# Patient Record
Sex: Female | Born: 1960
Health system: Southern US, Community
[De-identification: ages and names within clinical notes are randomized; demographics above are authoritative.]

## PROBLEM LIST (undated history)

## (undated) DIAGNOSIS — J449 Chronic obstructive pulmonary disease, unspecified: Secondary | ICD-10-CM

## (undated) DIAGNOSIS — I480 Paroxysmal atrial fibrillation: Secondary | ICD-10-CM

## (undated) DIAGNOSIS — G4733 Obstructive sleep apnea (adult) (pediatric): Secondary | ICD-10-CM

## (undated) DIAGNOSIS — E0590 Thyrotoxicosis, unspecified without thyrotoxic crisis or storm: Secondary | ICD-10-CM

## (undated) DIAGNOSIS — J45909 Unspecified asthma, uncomplicated: Secondary | ICD-10-CM

## (undated) DIAGNOSIS — C50919 Malignant neoplasm of unspecified site of unspecified female breast: Secondary | ICD-10-CM

## (undated) DIAGNOSIS — I6990 Unspecified sequelae of unspecified cerebrovascular disease: Secondary | ICD-10-CM

## (undated) HISTORY — PX: BREAST BIOPSY: SHX20

## (undated) HISTORY — DX: Paroxysmal atrial fibrillation: I48.0

## (undated) HISTORY — PX: ABDOMINAL HYSTERECTOMY: SHX81

## (undated) HISTORY — DX: Malignant neoplasm of unspecified site of unspecified female breast: C50.919

## (undated) HISTORY — DX: Unspecified sequelae of unspecified cerebrovascular disease: I69.90

## (undated) HISTORY — PX: TONSILLECTOMY: SUR1361

## (undated) HISTORY — PX: PORTACATH PLACEMENT: SHX2246

## (undated) HISTORY — PX: OTHER SURGICAL HISTORY: SHX169

## (undated) HISTORY — PX: APPENDECTOMY: SHX54

## (undated) HISTORY — DX: Obstructive sleep apnea (adult) (pediatric): G47.33

## (undated) HISTORY — DX: Unspecified asthma, uncomplicated: J45.909

## (undated) HISTORY — DX: Thyrotoxicosis, unspecified without thyrotoxic crisis or storm: E05.90

## (undated) HISTORY — DX: Chronic obstructive pulmonary disease, unspecified: J44.9

---

## 2005-01-23 ENCOUNTER — Ambulatory Visit: Payer: Self-pay | Admitting: Oncology

## 2006-01-16 ENCOUNTER — Ambulatory Visit: Payer: Self-pay | Admitting: Oncology

## 2007-01-16 ENCOUNTER — Ambulatory Visit: Payer: Self-pay | Admitting: Oncology

## 2009-08-09 DIAGNOSIS — Z860101 Personal history of adenomatous and serrated colon polyps: Secondary | ICD-10-CM | POA: Insufficient documentation

## 2009-08-09 DIAGNOSIS — Z86010 Personal history of colonic polyps: Secondary | ICD-10-CM | POA: Insufficient documentation

## 2009-08-09 HISTORY — DX: Personal history of colonic polyps: Z86.010

## 2009-08-09 HISTORY — DX: Personal history of adenomatous and serrated colon polyps: Z86.0101

## 2016-01-04 DIAGNOSIS — E0590 Thyrotoxicosis, unspecified without thyrotoxic crisis or storm: Secondary | ICD-10-CM | POA: Insufficient documentation

## 2016-01-04 HISTORY — DX: Thyrotoxicosis, unspecified without thyrotoxic crisis or storm: E05.90

## 2016-03-22 DIAGNOSIS — Z853 Personal history of malignant neoplasm of breast: Secondary | ICD-10-CM

## 2016-03-25 DIAGNOSIS — C50119 Malignant neoplasm of central portion of unspecified female breast: Secondary | ICD-10-CM | POA: Insufficient documentation

## 2016-03-25 DIAGNOSIS — R0789 Other chest pain: Secondary | ICD-10-CM

## 2016-03-25 DIAGNOSIS — I6990 Unspecified sequelae of unspecified cerebrovascular disease: Secondary | ICD-10-CM

## 2016-03-25 DIAGNOSIS — G4733 Obstructive sleep apnea (adult) (pediatric): Secondary | ICD-10-CM

## 2016-03-25 DIAGNOSIS — Z171 Estrogen receptor negative status [ER-]: Secondary | ICD-10-CM

## 2016-03-25 DIAGNOSIS — I480 Paroxysmal atrial fibrillation: Secondary | ICD-10-CM | POA: Insufficient documentation

## 2016-03-25 HISTORY — DX: Unspecified sequelae of unspecified cerebrovascular disease: I69.90

## 2016-03-25 HISTORY — DX: Paroxysmal atrial fibrillation: I48.0

## 2016-03-25 HISTORY — DX: Estrogen receptor negative status (ER-): Z17.1

## 2016-03-25 HISTORY — DX: Obstructive sleep apnea (adult) (pediatric): G47.33

## 2016-03-25 HISTORY — DX: Malignant neoplasm of central portion of unspecified female breast: C50.119

## 2016-03-25 HISTORY — DX: Other chest pain: R07.89

## 2016-03-26 ENCOUNTER — Encounter: Payer: Self-pay | Admitting: Cardiology

## 2016-03-28 ENCOUNTER — Encounter: Payer: Self-pay | Admitting: Cardiology

## 2017-06-04 DIAGNOSIS — Z853 Personal history of malignant neoplasm of breast: Secondary | ICD-10-CM

## 2017-06-04 DIAGNOSIS — L989 Disorder of the skin and subcutaneous tissue, unspecified: Secondary | ICD-10-CM

## 2017-08-18 ENCOUNTER — Ambulatory Visit: Payer: Self-pay | Admitting: Cardiology

## 2017-08-18 ENCOUNTER — Encounter: Payer: Self-pay | Admitting: Cardiology

## 2017-08-18 VITALS — BP 110/62 | HR 58 | Ht 67.5 in | Wt 178.9 lb

## 2017-08-18 DIAGNOSIS — R42 Dizziness and giddiness: Secondary | ICD-10-CM | POA: Insufficient documentation

## 2017-08-18 DIAGNOSIS — R0789 Other chest pain: Secondary | ICD-10-CM

## 2017-08-18 DIAGNOSIS — I480 Paroxysmal atrial fibrillation: Secondary | ICD-10-CM

## 2017-08-18 DIAGNOSIS — I6990 Unspecified sequelae of unspecified cerebrovascular disease: Secondary | ICD-10-CM

## 2017-08-18 DIAGNOSIS — G4733 Obstructive sleep apnea (adult) (pediatric): Secondary | ICD-10-CM

## 2017-08-18 HISTORY — DX: Dizziness and giddiness: R42

## 2017-08-18 NOTE — Progress Notes (Signed)
Cardiology Office Note:    Date:  08/18/2017   ID:  Dana [name], DOB [birth year], MRN [id number]  PCP:  Jeanie Sewer, NP  Cardiologist:  Jenne Campus, MD    Referring MD: Jeanie Sewer, NP   Chief Complaint  Patient presents with  . Chest Pain  . Dizziness  . Atrial Fibrillation  I feel weak and tired  History of Present Illness:    Dana [name] is a 57 y.o. female with history of CVA.  I saw her in 2017 at the end of that here reason for that was the fact that she got paroxysmal atrial fibrillation we talked about anticoagulation also in 2014 she had CVA.  We initiated workup to put her on anticoagulation and then she disappeared from follow-up and I have not seen her until today.  Chief complaint this time is weak tired and exhausted she says she wakes up in the morning she does not feel like getting up and doing anything.  Described post to have palpitations which means by that her heart can speed up and go fast for a few seconds.  It happens maybe once or maybe twice a week.  She does not have any dizziness while she got this sensation no shortness of breath no chest pain.  Described to have also some lightheadedness she said she could feel all day kind of unsteady and lightheaded.  No syncope no passing out no nightmares. Apparently recently she was find to be mildly anemic and she is scheduled to have colonoscopy next week by Dr. Melina Copa.  Past Medical History:  Diagnosis Date  . Hyperthyroidism 01/04/2016  . Late effects of CVA (cerebrovascular accident) 03/25/2016  . Obstructive sleep apnea syndrome 03/25/2016  . Paroxysmal atrial fibrillation (HCC) 03/25/2016      Current Medications: Current Meds  Medication Sig  . aspirin EC 81 MG tablet Take 1 tablet by mouth daily.  Marland Kitchen atenolol-chlorthalidone (TENORETIC) 100-25 MG tablet Take 0.5 tablets by mouth daily.  Marland Kitchen atorvastatin (LIPITOR) 20 MG tablet Take 1 tablet by mouth daily.  . fluticasone  (FLONASE) 50 MCG/ACT nasal spray Place 2 sprays into the nose daily.  Marland Kitchen loratadine (CLARITIN) 10 MG tablet Take 1 tablet by mouth daily.  . meclizine (ANTIVERT) 25 MG tablet Take 25 mg by mouth 3 (three) times daily as needed for dizziness.  . mometasone-formoterol (DULERA) 200-5 MCG/ACT AERO Inhale 2 puffs into the lungs 2 (two) times daily.  . montelukast (SINGULAIR) 10 MG tablet Take 10 mg by mouth daily.  . pantoprazole (PROTONIX) 40 MG tablet Take 40 mg by mouth daily.  . sertraline (ZOLOFT) 100 MG tablet Take 150 mg by mouth daily.   . Vitamin D, Ergocalciferol, (DRISDOL) 50000 units CAPS capsule Take 1 capsule by mouth once a week.     Allergies:   Cat hair extract and Other   Social History   Socioeconomic History  . Marital status: Single    Spouse name: Not on file  . Number of children: Not on file  . Years of education: Not on file  . Highest education level: Not on file  Occupational History  . Not on file  Social Needs  . Financial resource strain: Not on file  . Food insecurity:    Worry: Not on file    Inability: Not on file  . Transportation needs:    Medical: Not on file    Non-medical: Not on file  Tobacco Use  . Smoking status: Former Smoker  Types: Cigarettes    Last attempt to quit: 02/24/2013    Years since quitting: 4.4  . Smokeless tobacco: Never Used  Substance and Sexual Activity  . Alcohol use: Yes    Comment: Once a year  . Drug use: No  . Sexual activity: Not on file  Lifestyle  . Physical activity:    Days per week: Not on file    Minutes per session: Not on file  . Stress: Not on file  Relationships  . Social connections:    Talks on phone: Not on file    Gets together: Not on file    Attends religious service: Not on file    Active member of club or organization: Not on file    Attends meetings of clubs or organizations: Not on file    Relationship status: Not on file  Other Topics Concern  . Not on file  Social History  Narrative  . Not on file     Family History: The patient's family history includes Hypertension in her maternal grandmother and mother; Lung cancer in her maternal uncle; Ovarian cancer in her paternal aunt and paternal grandfather. ROS:   Please see the history of present illness.    All 14 point review of systems negative except as described per history of present illness  EKGs/Labs/Other Studies Reviewed:      Recent Labs: No results found for requested labs within last 8760 hours.  Recent Lipid Panel No results found for: CHOL, TRIG, HDL, CHOLHDL, VLDL, LDLCALC, LDLDIRECT  Physical Exam:    VS:  BP 110/62   Pulse (!) 58   Ht 5' 7.5" (1.715 m)   Wt 178 lb 14.4 oz (81.1 kg)   BMI 27.61 kg/m     Wt Readings from Last 3 Encounters:  08/18/17 178 lb 14.4 oz (81.1 kg)     GEN:  Well nourished, well developed in no acute distress HEENT: Normal NECK: No JVD; No carotid bruits LYMPHATICS: No lymphadenopathy CARDIAC: RRR, no murmurs, no rubs, no gallops RESPIRATORY:  Clear to auscultation without rales, wheezing or rhonchi  ABDOMEN: Soft, non-tender, non-distended MUSCULOSKELETAL:  No edema; No deformity  SKIN: Warm and dry LOWER EXTREMITIES: no swelling NEUROLOGIC:  Alert and oriented x 3 PSYCHIATRIC:  Normal affect   ASSESSMENT:    1. Paroxysmal atrial fibrillation (HCC)   2. Obstructive sleep apnea syndrome   3. Atypical chest pain   4. Late effects of CVA (cerebrovascular accident)    PLAN:    In order of problems listed above:  1. Paroxysmal atrial fibrillation: Her chads 2 Vascor equals 3.  Ideally she need to be anticoagulated.  We initiated workup for it last time when I saw him 2017 but there was no follow-up and she is still off anticoagulation.  The only medication she takes is aspirin.  She is scheduled to have colonoscopy done next week will wait for results of the test before initiating anticoagulation.  I asked her to wear event recorder for a month to  see if she had any recurrences of atrial fibrillation. 2. Active sleep apnea: The followed by internal medicine team. 3. Typical chest pain.  Denies having any now. 4. This and fatigue.  I suspect is multifactorial.  Apparently she is mildly anemic: I will ask him to have echocardiogram to check left ventricular ejection fraction.  I did review her echocardiogram from 2017 which was normal. 5. Effect of CVA: Not a new problem.  MRI from 2017 which showed  old ganglionic infarct. 6. I suspect depression play significant role here.  Initially her medication for it was increased and she started feeling better.  But she may need to see psychiatrist and psychotherapist.   Medication Adjustments/Labs and Tests Ordered: Current medicines are reviewed at length with the patient today.  Concerns regarding medicines are outlined above.  No orders of the defined types were placed in this encounter.  Medication changes: No orders of the defined types were placed in this encounter.   Signed, Park Liter, MD, [facility] 08/18/2017 4:17 PM    [facility]

## 2017-08-18 NOTE — Patient Instructions (Signed)
Medication Instructions:  Your physician recommends that you continue on your current medications as directed. Please refer to the Current Medication list given to you today.   Labwork: None  Testing/Procedures: Your physician has requested that you have an echocardiogram. Echocardiography is a painless test that uses sound waves to create images of your heart. It provides your doctor with information about the size and shape of your heart and how well your heart's chambers and valves are working. This procedure takes approximately one hour. There are no restrictions for this procedure.  Your physician has recommended that you wear an event monitor. Event monitors are medical devices that record the heart's electrical activity. Doctors most often [place] these monitors to diagnose arrhythmias. Arrhythmias are problems with the speed or rhythm of the heartbeat. The monitor is a small, portable device. You can wear one while you do your normal daily activities. This is usually used to diagnose what is causing palpitations/syncope (passing out). Wear for 30 days.  Follow-Up: Your physician recommends that you schedule a follow-up appointment in: 6 weeks.  Any Other Special Instructions Will Be Listed Below (If Applicable).     If you need a refill on your cardiac medications before your next appointment, please call your pharmacy.

## 2017-08-20 DIAGNOSIS — R911 Solitary pulmonary nodule: Secondary | ICD-10-CM

## 2017-08-20 DIAGNOSIS — Z853 Personal history of malignant neoplasm of breast: Secondary | ICD-10-CM

## 2017-09-15 ENCOUNTER — Encounter: Payer: Self-pay | Admitting: *Deleted

## 2017-09-15 ENCOUNTER — Ambulatory Visit (HOSPITAL_BASED_OUTPATIENT_CLINIC_OR_DEPARTMENT_OTHER)
Admission: RE | Admit: 2017-09-15 | Discharge: 2017-09-15 | Disposition: A | Discharge status: Home / Self Care | Payer: No Typology Code available for payment source | Source: Ambulatory Visit | Attending: Cardiology | Admitting: Cardiology

## 2017-09-15 ENCOUNTER — Ambulatory Visit: Payer: Self-pay

## 2017-09-15 DIAGNOSIS — R0789 Other chest pain: Secondary | ICD-10-CM | POA: Insufficient documentation

## 2017-09-15 DIAGNOSIS — G4733 Obstructive sleep apnea (adult) (pediatric): Secondary | ICD-10-CM | POA: Insufficient documentation

## 2017-09-15 DIAGNOSIS — R079 Chest pain, unspecified: Secondary | ICD-10-CM | POA: Insufficient documentation

## 2017-09-15 DIAGNOSIS — Z8673 Personal history of transient ischemic attack (TIA), and cerebral infarction without residual deficits: Secondary | ICD-10-CM | POA: Insufficient documentation

## 2017-09-15 DIAGNOSIS — I6990 Unspecified sequelae of unspecified cerebrovascular disease: Secondary | ICD-10-CM | POA: Insufficient documentation

## 2017-09-15 DIAGNOSIS — I480 Paroxysmal atrial fibrillation: Secondary | ICD-10-CM | POA: Insufficient documentation

## 2017-09-15 NOTE — Progress Notes (Unsigned)
Faxed letter to [employer] stating pt does not have insurance and will not be billed so that [employer] would know not to bill the patient. This was per Mitch our [employer] rep.

## 2017-09-15 NOTE — Progress Notes (Signed)
Echocardiogram 2D Echocardiogram has been performed.  Joelene Millin 09/15/2017, 2:53 PM

## 2017-09-29 ENCOUNTER — Ambulatory Visit: Payer: Medicaid Other | Admitting: Cardiology

## 2017-10-27 ENCOUNTER — Encounter: Payer: Self-pay | Admitting: Cardiology

## 2017-10-27 ENCOUNTER — Ambulatory Visit (INDEPENDENT_AMBULATORY_CARE_PROVIDER_SITE_OTHER): Payer: No Typology Code available for payment source | Admitting: Cardiology

## 2017-10-27 VITALS — BP 110/62 | HR 74 | Ht 67.5 in | Wt 179.8 lb

## 2017-10-27 DIAGNOSIS — R0789 Other chest pain: Secondary | ICD-10-CM

## 2017-10-27 DIAGNOSIS — I480 Paroxysmal atrial fibrillation: Secondary | ICD-10-CM

## 2017-10-27 DIAGNOSIS — I6990 Unspecified sequelae of unspecified cerebrovascular disease: Secondary | ICD-10-CM

## 2017-10-27 NOTE — Progress Notes (Signed)
Cardiology Office Note:    Date:  10/27/2017   ID:  Dana [name], DOB 26-Jun-1960, MRN [id number]  PCP:  Jeanie Sewer, NP  Cardiologist:  Jenne Campus, MD    Referring MD: Jeanie Sewer, NP   Chief Complaint  Patient presents with  . 6 week follow up  Well  History of Present Illness:    Dana [name] is a 57 y.o. female with paroxysmal atrial fibrillation, CVA.  Her chads 2 Vascor equals 3 she needs to be anticoagulated she did have colonoscopy and apparently nothing was identified there.  Finally I will ask her to have CBC done if CBC is fine we will initiate anticoagulation with Eliquis 5 twice daily I gave her samples of this medication.  I spent Gradle of time explained to her what we identified she wear event recorder trying to see the burden of atrial fibrillation likely we did not require any and again the purpose of when she was trying to see if she meets criteria for antiarrhythmic and she does not.  Past Medical History:  Diagnosis Date  . Hyperthyroidism 01/04/2016  . Late effects of CVA (cerebrovascular accident) 03/25/2016  . Obstructive sleep apnea syndrome 03/25/2016  . Paroxysmal atrial fibrillation (HCC) 03/25/2016      Current Medications: Current Meds  Medication Sig  . aspirin EC 81 MG tablet Take 1 tablet by mouth daily.  Marland Kitchen atenolol-chlorthalidone (TENORETIC) 100-25 MG tablet Take 0.5 tablets by mouth daily.  Marland Kitchen atorvastatin (LIPITOR) 20 MG tablet Take 1 tablet by mouth daily.  . fluticasone furoate-vilanterol (BREO ELLIPTA) 200-25 MCG/INH AEPB Inhale 1 puff into the lungs daily.  . montelukast (SINGULAIR) 10 MG tablet Take 10 mg by mouth daily.  . pantoprazole (PROTONIX) 40 MG tablet Take 40 mg by mouth daily.  . sertraline (ZOLOFT) 100 MG tablet Take 150 mg by mouth daily.   . Vitamin D, Ergocalciferol, (DRISDOL) 50000 units CAPS capsule Take 1 capsule by mouth once a week.     Allergies:   Patient has no known allergies.    Social History   Socioeconomic History  . Marital status: Single    Spouse name: Not on file  . Number of children: Not on file  . Years of education: Not on file  . Highest education level: Not on file  Occupational History  . Not on file  Social Needs  . Financial resource strain: Not on file  . Food insecurity:    Worry: Not on file    Inability: Not on file  . Transportation needs:    Medical: Not on file    Non-medical: Not on file  Tobacco Use  . Smoking status: Former Smoker    Types: Cigarettes    Last attempt to quit: 02/24/2013    Years since quitting: 4.6  . Smokeless tobacco: Never Used  Substance and Sexual Activity  . Alcohol use: Yes    Comment: Once a year  . Drug use: No  . Sexual activity: Not on file  Lifestyle  . Physical activity:    Days per week: Not on file    Minutes per session: Not on file  . Stress: Not on file  Relationships  . Social connections:    Talks on phone: Not on file    Gets together: Not on file    Attends religious service: Not on file    Active member of club or organization: Not on file    Attends meetings of clubs or organizations: Not  on file    Relationship status: Not on file  Other Topics Concern  . Not on file  Social History Narrative  . Not on file     Family History: The patient's family history includes Hypertension in her maternal grandmother and mother; Lung cancer in her maternal uncle; Ovarian cancer in her paternal aunt and paternal grandfather. ROS:   Please see the history of present illness.    All 14 point review of systems negative except as described per history of present illness  EKGs/Labs/Other Studies Reviewed:      Recent Labs: No results found for requested labs within last 8760 hours.  Recent Lipid Panel No results found for: CHOL, TRIG, HDL, CHOLHDL, VLDL, LDLCALC, LDLDIRECT  Physical Exam:    VS:  BP 110/62   Pulse 74   Ht 5' 7.5" (1.715 m)   Wt 179 lb 12.8 oz (81.6 kg)    SpO2 97%   BMI 27.75 kg/m     Wt Readings from Last 3 Encounters:  10/27/17 179 lb 12.8 oz (81.6 kg)  08/18/17 178 lb 14.4 oz (81.1 kg)     GEN:  Well nourished, well developed in no acute distress HEENT: Normal NECK: No JVD; No carotid bruits LYMPHATICS: No lymphadenopathy CARDIAC: RRR, no murmurs, no rubs, no gallops RESPIRATORY:  Clear to auscultation without rales, wheezing or rhonchi  ABDOMEN: Soft, non-tender, non-distended MUSCULOSKELETAL:  No edema; No deformity  SKIN: Warm and dry LOWER EXTREMITIES: no swelling NEUROLOGIC:  Alert and oriented x 3 PSYCHIATRIC:  Normal affect   ASSESSMENT:    1. Paroxysmal atrial fibrillation (HCC)   2. Atypical chest pain   3. Late effects of CVA (cerebrovascular accident)    PLAN:    In order of problems listed above:  1. Paroxysmal atrial fibrillation: Maintaining sinus rhythm.  We will initiate anticoagulation with Eliquis 5 mill grams twice daily after CBC will be checked. 2. Atypical chest pain: Denies having any 3. Late effect CVA.  Doing well stable  We will check CBC if sleep is fine we will initiate Eliquis 5 twice daily   Medication Adjustments/Labs and Tests Ordered: Current medicines are reviewed at length with the patient today.  Concerns regarding medicines are outlined above.  No orders of the defined types were placed in this encounter.  Medication changes: No orders of the defined types were placed in this encounter.   Signed, Park Liter, MD, [facility] 10/27/2017 3:06 PM    [facility]

## 2017-10-27 NOTE — Patient Instructions (Signed)
Medication Instructions:  Your physician recommends that you continue on your current medications as directed. Please refer to the Current Medication list given to you today.  **If your lab work comes back normal, you will start eliquis 5 mg twice daily. Do not start this medication until you are contacted with your lab results and directed to do so.  Labwork: Your physician recommends that you return for lab work today: CBC.   Testing/Procedures: None  Follow-Up: Your physician recommends that you schedule a follow-up appointment in: 2 months.  If you need a refill on your cardiac medications before your next appointment, please call your pharmacy.   Thank you for choosing [facility]! Robyne Peers, RN [phone]

## 2017-10-28 ENCOUNTER — Telehealth: Payer: Self-pay | Admitting: *Deleted

## 2017-10-28 LAB — CBC
Hematocrit: 37.6 % (ref 34.0–46.6)
Hemoglobin: 12.5 g/dL (ref 11.1–15.9)
MCH: 28 pg (ref 26.6–33.0)
MCHC: 33.2 g/dL (ref 31.5–35.7)
MCV: 84 fL (ref 79–97)
PLATELETS: 210 10*3/uL (ref 150–450)
RBC: 4.47 x10E6/uL (ref 3.77–5.28)
RDW: 14.2 % (ref 12.3–15.4)
WBC: 8.8 10*3/uL (ref 3.4–10.8)

## 2017-10-28 MED ORDER — APIXABAN 5 MG PO TABS: 5.0000 mg | ORAL_TABLET | Freq: Two times a day (BID) | ORAL | 6 refills | Status: DC

## 2017-10-28 NOTE — Telephone Encounter (Signed)
-----   Message from Park Liter, MD sent at 10/28/2017  8:47 AM EDT ----- Cbc looks good, start eliquis 5 mg po bid

## 2017-10-28 NOTE — Telephone Encounter (Signed)
Patient informed of results and advised to start eliquis 5 mg twice daily. Prescription sent to [facility] in [place]. Patient verbalized understanding. No further questions.

## 2017-11-12 ENCOUNTER — Encounter: Payer: Self-pay | Admitting: Cardiology

## 2017-11-28 ENCOUNTER — Telehealth: Payer: Self-pay | Admitting: *Deleted

## 2017-11-28 NOTE — Telephone Encounter (Signed)
Left message to return call in regards to event monitor results.

## 2017-12-01 NOTE — Telephone Encounter (Signed)
Patient informed of results. Attempted to schedule patient's follow up appointment from recall but August schedule is still not available. Advised patient to call back to schedule before the end of the week. Patient verbalized understanding. No further questions.

## 2018-01-16 ENCOUNTER — Ambulatory Visit (INDEPENDENT_AMBULATORY_CARE_PROVIDER_SITE_OTHER): Payer: No Typology Code available for payment source | Admitting: Cardiology

## 2018-01-16 ENCOUNTER — Encounter: Payer: Self-pay | Admitting: Cardiology

## 2018-01-16 VITALS — BP 118/72 | HR 54 | Ht 67.5 in | Wt 175.2 lb

## 2018-01-16 DIAGNOSIS — I480 Paroxysmal atrial fibrillation: Secondary | ICD-10-CM

## 2018-01-16 DIAGNOSIS — I6990 Unspecified sequelae of unspecified cerebrovascular disease: Secondary | ICD-10-CM

## 2018-01-16 DIAGNOSIS — R0789 Other chest pain: Secondary | ICD-10-CM

## 2018-01-16 MED ORDER — APIXABAN 5 MG PO TABS: 5.0000 mg | ORAL_TABLET | Freq: Two times a day (BID) | ORAL | 6 refills | Status: DC

## 2018-01-16 NOTE — Progress Notes (Signed)
Cardiology Office Note:    Date:  01/16/2018   ID:  Dana [name], DOB 11-May-1961, MRN [id number]  PCP:  Jeanie Sewer, NP  Cardiologist:  Jenne Campus, MD    Referring MD: Jeanie Sewer, NP   No chief complaint on file. I am doing well  History of Present Illness:    Dana [name] is a 57 y.o. female with history of paroxysmal atrial fibrillation.  We elected to proceed with Eliquis however for some reason she did not get that medication.  Overall doing well denies have any palpitation tightness squeezing pressure burning chest.  She describes to have some atypical chest pain while sitting at the same time she can walk climb stairs and have no difficulty doing it.  She is very active and like to be that way.  Past Medical History:  Diagnosis Date  . Hyperthyroidism 01/04/2016  . Late effects of CVA (cerebrovascular accident) 03/25/2016  . Obstructive sleep apnea syndrome 03/25/2016  . Paroxysmal atrial fibrillation ([place]) 03/25/2016    Past Surgical History:  Procedure Laterality Date  . ABDOMINAL HYSTERECTOMY    . APPENDECTOMY    . BREAST BIOPSY    . lymph node removal    . PORTACATH PLACEMENT    . portacath removal    . TONSILLECTOMY      Current Medications: Current Meds  Medication Sig  . aspirin EC 81 MG tablet Take 1 tablet by mouth daily.  Marland Kitchen atenolol-chlorthalidone (TENORETIC) 100-25 MG tablet Take 0.5 tablets by mouth daily.  Marland Kitchen atorvastatin (LIPITOR) 20 MG tablet Take 1 tablet by mouth daily.  . montelukast (SINGULAIR) 10 MG tablet Take 10 mg by mouth daily.  . pantoprazole (PROTONIX) 40 MG tablet Take 40 mg by mouth daily.  . sertraline (ZOLOFT) 100 MG tablet Take 150 mg by mouth daily.   . Vitamin D, Ergocalciferol, (DRISDOL) 50000 units CAPS capsule Take 1 capsule by mouth once a week.     Allergies:   Patient has no known allergies.   Social History   Socioeconomic History  . Marital status: Single    Spouse name: Not on file    . Number of children: Not on file  . Years of education: Not on file  . Highest education level: Not on file  Occupational History  . Not on file  Social Needs  . Financial resource strain: Not on file  . Food insecurity:    Worry: Not on file    Inability: Not on file  . Transportation needs:    Medical: Not on file    Non-medical: Not on file  Tobacco Use  . Smoking status: Former Smoker    Types: Cigarettes    Last attempt to quit: 02/24/2013    Years since quitting: 4.8  . Smokeless tobacco: Never Used  Substance and Sexual Activity  . Alcohol use: Yes    Comment: Once a year  . Drug use: No  . Sexual activity: Not on file  Lifestyle  . Physical activity:    Days per week: Not on file    Minutes per session: Not on file  . Stress: Not on file  Relationships  . Social connections:    Talks on phone: Not on file    Gets together: Not on file    Attends religious service: Not on file    Active member of club or organization: Not on file    Attends meetings of clubs or organizations: Not on file    Relationship  status: Not on file  Other Topics Concern  . Not on file  Social History Narrative  . Not on file     Family History: The patient's family history includes Hypertension in her maternal grandmother and mother; Lung cancer in her maternal uncle; Ovarian cancer in her paternal aunt and paternal grandfather. ROS:   Please see the history of present illness.    All 14 point review of systems negative except as described per history of present illness  EKGs/Labs/Other Studies Reviewed:      Recent Labs: 10/27/2017: Hemoglobin 12.5; Platelets 210  Recent Lipid Panel No results found for: CHOL, TRIG, HDL, CHOLHDL, VLDL, LDLCALC, LDLDIRECT  Physical Exam:    VS:  BP 118/72 (BP Location: Right Arm, Patient Position: Sitting, Cuff Size: Normal)   Pulse (!) 54   Ht 5' 7.5" (1.715 m)   Wt 175 lb 3.2 oz (79.5 kg)   SpO2 97%   BMI 27.04 kg/m     Wt Readings  from Last 3 Encounters:  01/16/18 175 lb 3.2 oz (79.5 kg)  10/27/17 179 lb 12.8 oz (81.6 kg)  08/18/17 178 lb 14.4 oz (81.1 kg)     GEN:  Well nourished, well developed in no acute distress HEENT: Normal NECK: No JVD; No carotid bruits LYMPHATICS: No lymphadenopathy CARDIAC: RRR, no murmurs, no rubs, no gallops RESPIRATORY:  Clear to auscultation without rales, wheezing or rhonchi  ABDOMEN: Soft, non-tender, non-distended MUSCULOSKELETAL:  No edema; No deformity  SKIN: Warm and dry LOWER EXTREMITIES: no swelling NEUROLOGIC:  Alert and oriented x 3 PSYCHIATRIC:  Normal affect   ASSESSMENT:    1. Paroxysmal atrial fibrillation (HCC)   2. Atypical chest pain   3. Late effects of CVA (cerebrovascular accident)    PLAN:    In order of problems listed above:  1. Paroxysmal atrial fibrillation.  She is will be put on Eliquis 5 twice daily we will give her samples today she will stop aspirin. 2. Atypical chest pain not related to exertion.  We will continue present management. 3. Late effect of CVA doing well from that point review.   Medication Adjustments/Labs and Tests Ordered: Current medicines are reviewed at length with the patient today.  Concerns regarding medicines are outlined above.  No orders of the defined types were placed in this encounter.  Medication changes: No orders of the defined types were placed in this encounter.   Signed, Park Liter, MD, [facility] 01/16/2018 3:09 PM    [place]

## 2018-01-16 NOTE — Patient Instructions (Addendum)
Medication Instructions:  Your physician has recommended you make the following change in your medication:   STOP aspirin  START apixaban (eliquis) 5 mg twice daily: samples provided  Labwork: None  Testing/Procedures: None   Follow-Up: Your physician wants you to follow-up in: 4 months. You will receive a reminder letter in the mail two months in advance. If you don't receive a letter, please call our office to schedule the follow-up appointment.   If you need a refill on your cardiac medications before your next appointment, please call your pharmacy.   Thank you for choosing [facility]! Robyne Peers, RN [phone]   Apixaban oral tablets What is this medicine? APIXABAN (a PIX a ban) is an anticoagulant (blood thinner). It is used to lower the chance of stroke in people with a medical condition called atrial fibrillation. It is also used to treat or prevent blood clots in the lungs or in the veins. This medicine may be used for other purposes; ask your health care provider or pharmacist if you have questions. COMMON BRAND NAME(S): Eliquis What should I tell my health care provider before I take this medicine? They need to know if you have any of these conditions: -bleeding disorders -bleeding in the brain -blood in your stools (black or tarry stools) or if you have blood in your vomit -history of stomach bleeding -kidney disease -liver disease -mechanical heart valve -an unusual or allergic reaction to apixaban, other medicines, foods, dyes, or preservatives -pregnant or trying to get pregnant -breast-feeding How should I use this medicine? Take this medicine by mouth with a glass of water. Follow the directions on the prescription label. You can take it with or without food. If it upsets your stomach, take it with food. Take your medicine at regular intervals. Do not take it more often than directed. Do not stop taking except on your doctor's advice. Stopping  this medicine may increase your risk of a blot clot. Be sure to refill your prescription before you run out of medicine. Talk to your pediatrician regarding the use of this medicine in children. Special care may be needed. Overdosage: If you think you have taken too much of this medicine contact a poison control center or emergency room at once. NOTE: This medicine is only for you. Do not share this medicine with others. What if I miss a dose? If you miss a dose, take it as soon as you can. If it is almost time for your next dose, take only that dose. Do not take double or extra doses. What may interact with this medicine? This medicine may interact with the following: -aspirin and aspirin-like medicines -certain medicines for fungal infections like ketoconazole and itraconazole -certain medicines for seizures like carbamazepine and phenytoin -certain medicines that treat or prevent blood clots like warfarin, enoxaparin, and dalteparin -clarithromycin -NSAIDs, medicines for pain and inflammation, like ibuprofen or naproxen -rifampin -ritonavir -St. John's wort This list may not describe all possible interactions. Give your health care provider a list of all the medicines, herbs, non-prescription drugs, or dietary supplements you use. Also tell them if you smoke, drink alcohol, or use illegal drugs. Some items may interact with your medicine. What should I watch for while using this medicine? Visit your doctor or health care professional for regular checks on your progress. Notify your doctor or health care professional and seek emergency treatment if you develop breathing problems; changes in vision; chest pain; severe, sudden headache; pain, swelling, warmth in the leg; trouble  speaking; sudden numbness or weakness of the face, arm or leg. These can be signs that your condition has gotten worse. If you are going to have surgery or other procedure, tell your doctor that you are taking this  medicine. What side effects may I notice from receiving this medicine? Side effects that you should report to your doctor or health care professional as soon as possible: -allergic reactions like skin rash, itching or hives, swelling of the face, lips, or tongue -signs and symptoms of bleeding such as bloody or black, tarry stools; red or dark-brown urine; spitting up blood or brown material that looks like coffee grounds; red spots on the skin; unusual bruising or bleeding from the eye, gums, or nose This list may not describe all possible side effects. Call your doctor for medical advice about side effects. You may report side effects to FDA at 1-800-FDA-1088. Where should I keep my medicine? Keep out of the reach of children. Store at room temperature between 20 and 25 degrees C (68 and 77 degrees F). Throw away any unused medicine after the expiration date. NOTE: This sheet is a summary. It may not cover all possible information. If you have questions about this medicine, talk to your doctor, pharmacist, or health care provider.  2018 Elsevier/Gold Standard (2015-12-04 11:54:23)

## 2018-01-19 ENCOUNTER — Other Ambulatory Visit: Payer: Self-pay | Admitting: *Deleted

## 2018-01-19 MED ORDER — APIXABAN 5 MG PO TABS: 5.0000 mg | ORAL_TABLET | Freq: Two times a day (BID) | ORAL | 3 refills | Status: AC

## 2018-01-20 ENCOUNTER — Telehealth: Payer: Self-pay | Admitting: *Deleted

## 2018-01-20 NOTE — Telephone Encounter (Signed)
Dana [name] Patient Assistance regarding eliquis approval yesterday. A representative informed me that the application that was sent on 11/13/17 is not in their system. Re-faxed completed application yesterday and called to confirm that it was received today. It has been received and will be processed as soon as possible per [name].   Patient has been informed that her application is under review and I will continue to update her through the process. Patient verbalized understanding. No further questions.

## 2018-01-29 ENCOUNTER — Telehealth: Payer: Self-pay | Admitting: Emergency Medicine

## 2018-01-29 NOTE — Telephone Encounter (Signed)
Patient made aware that she has been approved for patient assistance for eliquis and we will update her as soon the medication arrives at the office.

## 2018-01-30 NOTE — Telephone Encounter (Signed)
Patient informed that eliquis medication has arrived at the office. Patient will pick up today.

## 2018-04-21 ENCOUNTER — Telehealth: Payer: Self-pay | Admitting: *Deleted

## 2018-04-21 NOTE — Telephone Encounter (Signed)
Patient informed that eliquis from patient assistance has arrived and is ready for pick up in our [place] office. Patient verbalized understanding. No further questions.

## 2018-06-09 ENCOUNTER — Ambulatory Visit: Payer: Self-pay | Admitting: Cardiology

## 2018-07-08 ENCOUNTER — Ambulatory Visit: Payer: Self-pay | Admitting: Cardiology

## 2018-08-04 ENCOUNTER — Telehealth: Payer: Self-pay | Admitting: *Deleted

## 2018-08-04 NOTE — Telephone Encounter (Signed)
Pt called inquiring about Eliquis that is supposed to be brought from [facility] to [place]. Spoke with Estill Bamberg who stated it had not been brought over yet. Will get Olivia Mackie to bring this over and will then inform pt it is here.

## 2018-11-16 ENCOUNTER — Telehealth: Payer: Self-pay | Admitting: Emergency Medicine

## 2018-11-16 DIAGNOSIS — I480 Paroxysmal atrial fibrillation: Secondary | ICD-10-CM

## 2018-11-16 DIAGNOSIS — G4733 Obstructive sleep apnea (adult) (pediatric): Secondary | ICD-10-CM

## 2018-11-16 NOTE — Telephone Encounter (Signed)
Patient informed that sleep study has been ordered and we will contact her with appointment date.

## 2018-11-17 ENCOUNTER — Telehealth: Payer: Self-pay | Admitting: *Deleted

## 2018-11-17 NOTE — Telephone Encounter (Signed)
-----   Message from Benancio Deeds sent at 11/16/2018  3:25 PM EDT ----- Regarding: FW: Sleep study  ----- Message ----- From: Ashok Norris, RN Sent: 11/16/2018   3:13 PM EDT To: [facility] Pre Cert/Auth Subject: Sleep study                                    Please precert home sleep study for snoring and obstructive sleep apnea syndrome per Dr. Agustin Cree.   Thanks  Angie Fava, RN

## 2018-11-17 NOTE — Telephone Encounter (Signed)
Staff message sent to [facility] ok to schedule sleep study. Patient's insurance does not require a PA.

## 2018-11-19 NOTE — Telephone Encounter (Signed)
Patient has been scheduled for sleep study and informed.

## 2018-12-30 ENCOUNTER — Other Ambulatory Visit: Payer: Self-pay

## 2018-12-30 ENCOUNTER — Ambulatory Visit (HOSPITAL_BASED_OUTPATIENT_CLINIC_OR_DEPARTMENT_OTHER): Payer: Medicare Other | Attending: Cardiology | Admitting: Cardiovascular Disease

## 2018-12-30 DIAGNOSIS — G4733 Obstructive sleep apnea (adult) (pediatric): Secondary | ICD-10-CM | POA: Diagnosis not present

## 2019-01-02 ENCOUNTER — Encounter (HOSPITAL_BASED_OUTPATIENT_CLINIC_OR_DEPARTMENT_OTHER): Payer: Self-pay | Admitting: Cardiovascular Disease

## 2019-01-02 NOTE — Procedures (Signed)
    Patient Name: Dana [name], Dana [name] Date: 12/30/2018 Gender: Female D.O.B: 26-May-1961 Age (years): 57 Referring Provider: Park Liter Height (inches): 46 Interpreting Physician: Shelva Majestic MD, ABSM Weight (lbs): 175 RPSGT: Jacolyn Reedy BMI: 27 MRN: [id number] Neck Size: 14.00  CLINICAL INFORMATION Sleep Study Type: HST  Indication for sleep study: OSA  Epworth Sleepiness Score: 14  SLEEP STUDY TECHNIQUE A multi-channel overnight portable sleep study was performed. The channels recorded were: nasal airflow, thoracic respiratory movement, and oxygen saturation with a pulse oximetry. Snoring was also monitored.  MEDICATIONS     apixaban (ELIQUIS) 5 MG TABS tablet         atenolol-chlorthalidone (TENORETIC) 100-25 MG tablet         atorvastatin (LIPITOR) 20 MG tablet         fluticasone furoate-vilanterol (BREO ELLIPTA) 200-25 MCG/INH AEPB         montelukast (SINGULAIR) 10 MG tablet         pantoprazole (PROTONIX) 40 MG tablet         sertraline (ZOLOFT) 100 MG tablet         Vitamin D, Ergocalciferol, (DRISDOL) 50000 units CAPS capsule      Patient self administered medications include: N/A.  SLEEP ARCHITECTURE Patient was studied for 534 minutes. The sleep efficiency was 100.0 % and the patient was supine for 82.9%. The arousal index was 0.0 per hour.  RESPIRATORY PARAMETERS The overall AHI was 30.1 per hour, with a central apnea index of 0.1 per hour.  The oxygen nadir was 80% during sleep.  CARDIAC DATA Mean heart rate during sleep was 61.8 bpm.  IMPRESSIONS - Severe obstructive sleep apnea occurred during this study (AHI  30.1/h). There is a significant positional component with supine sleep AHI 32.8/h vs non-supine sleep AHI 17.07/h. - No significant central sleep apnea occurred during this study (CAI = 0.1/h). - Severe oxygen desaturation was noted during this study to a nadir of 80%. Time spent below 89% was 44.7 minutes. - Patient  snored 7.5% during the sleep.  DIAGNOSIS - Obstructive Sleep Apnea (327.23 [G47.33 ICD-10]) - Nocturnal Hypoxemia (327.26 [G47.36 ICD-10])  RECOMMENDATIONS - In this patient with significant cardiovascular comorbidities and severe OSA recommend an in-lab CPAP titration study. - Effort should be made to optimize nasal and oropharyngeal patnecy. - Positional therapy avoiding supine position during sleep. - Avoid alcohol, sedatives and other CNS depressants that may worsen sleep apnea and disrupt normal sleep architecture. - Sleep hygiene should be reviewed to assess factors that may improve sleep quality. - Weight management and regular exercise should be initiated or continued. - Recommend a sleep clinic evaluation after CPAP initiation.   [Electronically signed] 01/02/2019 04:33 PM  Shelva Majestic MD, [facility],  ABSM Diplomate, American Board of Sleep Medicine   NPI: [id number] [place] PH: [id number]   FX: [phone] [place]

## 2019-01-08 ENCOUNTER — Telehealth: Payer: Self-pay | Admitting: *Deleted

## 2019-01-08 ENCOUNTER — Other Ambulatory Visit: Payer: Self-pay | Admitting: Cardiovascular Disease

## 2019-01-08 DIAGNOSIS — G4733 Obstructive sleep apnea (adult) (pediatric): Secondary | ICD-10-CM

## 2019-01-08 NOTE — Telephone Encounter (Signed)
Patient notified of sleep study results and recommendations. She agrees to proceed with CPAP titration. This has been scheduled for 01/15/19. Her COVID test has been scheduled on 01/15/19. She was informed she will need to quarantine after her study until she has her sleep study. Patient voiced understanding of this and agrees to do so.

## 2019-01-15 ENCOUNTER — Other Ambulatory Visit (HOSPITAL_COMMUNITY): Payer: No Typology Code available for payment source

## 2019-01-18 ENCOUNTER — Encounter (HOSPITAL_BASED_OUTPATIENT_CLINIC_OR_DEPARTMENT_OTHER): Payer: No Typology Code available for payment source | Admitting: Cardiovascular Disease

## 2019-02-12 DIAGNOSIS — Z853 Personal history of malignant neoplasm of breast: Secondary | ICD-10-CM

## 2019-02-12 DIAGNOSIS — I890 Lymphedema, not elsewhere classified: Secondary | ICD-10-CM

## 2019-02-23 LAB — HM DEXA SCAN

## 2019-11-16 ENCOUNTER — Other Ambulatory Visit: Payer: Self-pay

## 2019-11-16 ENCOUNTER — Ambulatory Visit (INDEPENDENT_AMBULATORY_CARE_PROVIDER_SITE_OTHER): Payer: Medicare Other | Admitting: Allergy

## 2019-11-16 ENCOUNTER — Encounter: Payer: Self-pay | Admitting: Allergy

## 2019-11-16 VITALS — BP 130/80 | HR 58 | Resp 18 | Ht 67.5 in | Wt 181.2 lb

## 2019-11-16 DIAGNOSIS — J439 Emphysema, unspecified: Secondary | ICD-10-CM | POA: Diagnosis not present

## 2019-11-16 DIAGNOSIS — H1013 Acute atopic conjunctivitis, bilateral: Secondary | ICD-10-CM

## 2019-11-16 DIAGNOSIS — J3089 Other allergic rhinitis: Secondary | ICD-10-CM | POA: Diagnosis not present

## 2019-11-16 MED ORDER — OLOPATADINE HCL 0.2 % OP SOLN: 1.0000 [drp] | Freq: Every day | OPHTHALMIC | 5 refills | Status: DC | PRN

## 2019-11-16 MED ORDER — AZELASTINE HCL 0.1 % NA SOLN: NASAL | 5 refills | Status: DC

## 2019-11-16 MED ORDER — TRELEGY ELLIPTA 100-62.5-25 MCG/INH IN AEPB: 1.0000 | INHALATION_SPRAY | Freq: Every day | RESPIRATORY_TRACT | 5 refills | Status: DC

## 2019-11-16 NOTE — Patient Instructions (Addendum)
COPD  - symptoms and history appear most consistent with COPD  - reported emphysema on lung imaging confirms COPD  - lung function testing is low and did not improve with bronchodilator  - stop Breo  - start Trelegy 117mcg 1 puff once a day  - have access to albuterol inhaler 2 puffs every 4-6 hours as needed for cough/wheeze/shortness of breath/chest tightness.  May use 15-20 minutes prior to activity.   Monitor frequency of use.    Allergies  - environmental allergy testing is positive to molds, dust mite, horse, mouse and dog  - continue Montelukast 10mg  daily  - for nasal drainage use nasal antihistamine, Astelin 2 sprays each nostril twice a day as needed  - can use Flonase 2 sprays each nostril daily if having nasal congestion/stuffy nose  - recommend use of a long-acting antihistamine like Zyrtec 10mg , Allegra 180mg  or Xyzal 5mg  daily as needed for allergy symptom control  - for itchy/watery eyes can use Olapatadine 0.2% 1 drop each eye daily as needed  follow-up in 3 months or sooner if needed

## 2019-11-16 NOTE — Progress Notes (Signed)
New Patient Note  RE: Dana [name] MRN: [id number] DOB: 04/02/1961 Date of Office Visit: 11/16/2019  Referring provider: Jeanie Sewer, NP Primary care provider: Jeanie Sewer, NP  Chief Complaint: cough, shortness of breath  History of present illness: Dana [name] is a 59 y.o. female presenting today for consultation for cough, shortness of breath.  She reports having had a cough since around 2014.   The cough is dry most of the time with occasional productivity.  She states she may have a few days in a row of a productive cough.  She states the cough is daily and is now starting to impact her at night at well.  She denies wheeze.  She does report feeling short of breath as well.  She does report chest tightness.  She uses albuterol 1-2 times a month and does not feel it helps much.  She is on Breo now for a few years and states she coughs after use with this as well.  She recalls trying [facility] before changing to [facility]  She is also on singulair since 2014.  She denies being treated with steroids in the past for this.  She does recall taking antibiotics for this cough early on in the course that did not help.    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   She worked this job for about 10 years.  She also states her home had mold around the stove and this has remedied.   She states she has been told she has asthma.  She also states her oncologist told her she had COPD based on routine imaging she has for her history of breast cancer.  She states there was evidence of emphysema on her lung imaging.    She reports a constant runny nose as well as itchy/watery eyes that started this season.  She takes benadryl at night to help with allergies and sleep aid.  She has been using Flonase but states that does not help her nasal symptoms at all.  No history of eczema.  No history of food allergy.      Review of systems: Review of Systems  Constitutional: Negative.   HENT:       See HPI  Eyes:       See HPI  Respiratory:       See HPI  Cardiovascular: Negative.   Gastrointestinal: Negative.   Musculoskeletal: Negative.   Skin: Negative.   Neurological: Negative.     All other systems negative unless noted above in HPI  Past medical history: Past Medical History:  Diagnosis Date   Asthma    Breast cancer ([place])    COPD (chronic obstructive pulmonary disease) ([place])    Hyperthyroidism 01/04/2016   Late effects of CVA (cerebrovascular accident) 03/25/2016   Obstructive sleep apnea syndrome 03/25/2016   Paroxysmal atrial fibrillation ([place]) 03/25/2016    Past surgical history: Past Surgical History:  Procedure Laterality Date   ABDOMINAL HYSTERECTOMY     APPENDECTOMY     BREAST BIOPSY     lymph node removal     PORTACATH PLACEMENT     portacath removal     TONSILLECTOMY      Family history:  Family History  Problem Relation Age of Onset   Hypertension Maternal Grandmother    Lung cancer Maternal Uncle    Hypertension Mother    Ovarian cancer Paternal  Aunt    Ovarian cancer Paternal Grandfather     Social history: She lives in a mobile home without carpeting with electric heating and central cooling.  Cats in the home.  No concern currently for mildew or mold or any roaches in the home.  She has a smoking history from 1976 to October 2014 a smoking 1 pack/day.  She does use a HEPA filter in the home.  Medication List: Current Outpatient Medications  Medication Sig Dispense Refill   albuterol (VENTOLIN HFA) 108 (90 Base) MCG/ACT inhaler Inhale into the lungs every 6 (six) hours as needed for wheezing or shortness of breath.     apixaban (ELIQUIS) 5 MG TABS tablet Take 1 tablet (5 mg total) by mouth 2 (two) times daily. 180 tablet 3   atenolol (TENORMIN) 50 MG tablet Take 50 mg by mouth daily.     atorvastatin (LIPITOR) 20 MG tablet Take  1 tablet by mouth daily.     diphenhydrAMINE (BENADRYL) 25 MG tablet Take 25 mg by mouth. At bedtime     fluticasone (FLONASE) 50 MCG/ACT nasal spray Place 2 sprays into both nostrils daily.     fluticasone furoate-vilanterol (BREO ELLIPTA) 200-25 MCG/INH AEPB Inhale 1 puff into the lungs daily.     montelukast (SINGULAIR) 10 MG tablet Take 10 mg by mouth daily.     pantoprazole (PROTONIX) 40 MG tablet Take 40 mg by mouth daily.     sertraline (ZOLOFT) 100 MG tablet Take 150 mg by mouth daily.      Ubrogepant (UBRELVY) 50 MG TABS Take 50 mg by mouth daily as needed.     Vitamin D, Ergocalciferol, (DRISDOL) 50000 units CAPS capsule Take 1 capsule by mouth once a week.     azelastine (ASTELIN) 0.1 % nasal spray Use 2 sprays in each nostril twice daily as needed 30 mL 5   Fluticasone-Umeclidin-Vilant (TRELEGY ELLIPTA) 100-62.5-25 MCG/INH AEPB Inhale 1 puff into the lungs daily. Rinse, gargle and spit after use. 28 each 5   Olopatadine HCl 0.2 % SOLN Place 1 drop into both eyes daily as needed. 2.5 mL 5   No current facility-administered medications for this visit.    Known medication allergies: No Known Allergies   Physical examination: Blood pressure 130/80, pulse (!) 58, resp. rate 18, height 5' 7.5" (1.715 m), weight 181 lb 3.2 oz (82.2 kg), SpO2 96 %.  General: Alert, interactive, in no acute distress. HEENT: PERRLA, TMs pearly gray, turbinates mildly edematous without discharge, post-pharynx mildly erythematous with visible mucus drainage in posterior oropharnyx. Neck: Supple without lymphadenopathy. Lungs: Mildly decreased breath sounds bilaterally without wheezing, rhonchi or rales. {no increased work of breathing. CV: Normal S1, S2 without murmurs. Abdomen: Nondistended, nontender. Skin: Warm and dry, without lesions or rashes. Extremities:  No clubbing, cyanosis or edema. Neuro:   Grossly intact.  Diagnositics/Labs:  Spirometry: FEV1: 1.59L 54%, FVC: 2.08L 54%,  ratio consistent with restrictive pattern.   no improvement s/p bronchodilator  Allergy testing: Environmental allergy testing is positive to Candida albicans, horse epithelia, mouse.  Intradermal testing is positive to mold mix 1, 2, 3, 4, dog epithelia and mite mix. Allergy testing results were read and interpreted by provider, documented by clinical staff.   Assessment and plan:   COPD  - symptoms and history appear most consistent with COPD versus asthma  - reported emphysema on lung imaging confirms COPD  - lung function testing is low and did not improve with bronchodilator which can be consistent with COPD  as well  - stop Breo  - start Trelegy 173mcg 1 puff once a day.  This is a triple therapy agent for COPD management  - have access to albuterol inhaler 2 puffs every 4-6 hours as needed for cough/wheeze/shortness of breath/chest tightness.  May use 15-20 minutes prior to activity.   Monitor frequency of use.    Allergic rhinitis with conjunctivitis  - environmental allergy testing is positive to molds, dust mite, horse, mouse and dog  - continue Montelukast 10mg  daily  - for nasal drainage use nasal antihistamine, Astelin 2 sprays each nostril twice a day as needed  - can use Flonase 2 sprays each nostril daily if having nasal congestion/stuffy nose  - recommend use of a long-acting antihistamine like Zyrtec 10mg , Allegra 180mg  or Xyzal 5mg  daily as needed for allergy symptom control  - for itchy/watery eyes can use Olapatadine 0.2% 1 drop each eye daily as needed  follow-up in 3 months or sooner if needed I appreciate the opportunity to take part in Dana [name]'s care. Please do not hesitate to contact me with questions.  Sincerely,   Prudy Feeler, MD Allergy/Immunology [facility]

## 2020-02-15 ENCOUNTER — Ambulatory Visit (INDEPENDENT_AMBULATORY_CARE_PROVIDER_SITE_OTHER): Payer: Medicare Other | Admitting: Allergy

## 2020-02-15 ENCOUNTER — Other Ambulatory Visit: Payer: Self-pay

## 2020-02-15 ENCOUNTER — Encounter: Payer: Self-pay | Admitting: Allergy

## 2020-02-15 VITALS — BP 120/62 | HR 60 | Resp 20

## 2020-02-15 DIAGNOSIS — G4733 Obstructive sleep apnea (adult) (pediatric): Secondary | ICD-10-CM

## 2020-02-15 DIAGNOSIS — J439 Emphysema, unspecified: Secondary | ICD-10-CM

## 2020-02-15 DIAGNOSIS — J3089 Other allergic rhinitis: Secondary | ICD-10-CM | POA: Diagnosis not present

## 2020-02-15 DIAGNOSIS — H1013 Acute atopic conjunctivitis, bilateral: Secondary | ICD-10-CM | POA: Diagnosis not present

## 2020-02-15 NOTE — Patient Instructions (Addendum)
COPD, emphysematous  -Symptoms and lung function are consistent with COPD.  She does not reverse with use of a bronchodilator.  - reported emphysema on lung imaging confirms COPD  -Advised her to try Trelegy off of the Singulair and see if she continues to have a cough with use.  If she does not then I would prefer her to be on Trelegy as a maintenance therapy for COPD   - have access to albuterol inhaler 2 puffs every 4-6 hours as needed for cough/wheeze/shortness of breath/chest tightness.  May use 15-20 minutes prior to activity.   Monitor frequency of use.    Allergic rhinitis with conjunctivitis  -Continue avoidance measures for molds, dust mite, horse, mouse and dog  - for nasal drainage continue use nasal antihistamine, Astelin 2 sprays each nostril twice a day as needed  - can use Flonase 2 sprays each nostril daily if having nasal congestion/stuffy nose  - recommend use of a long-acting antihistamine like Zyrtec 10mg , Allegra 180mg  or Xyzal 5mg  daily as needed for allergy symptom control  - for itchy/watery eyes can use Olapatadine 0.2% 1 drop each eye daily as needed  Obstructive sleep apnea  -She had a sleep apnea study by her cardiologist that she reports showed evidence of apneic events and recommended use of a CPAP device however she has not been able to have this arranged.  Unfortunately I am not able to prescribe her manage her CPAP for OSA and would recommend referral to a pulmonologist or sleep medicine doctor to do so.  follow-up in 3-6 months or sooner if needed

## 2020-02-15 NOTE — Progress Notes (Signed)
Follow-up Note  RE: Dana [name] MRN: [id number] DOB: 1961-03-27 Date of Office Visit: 02/15/2020   History of present illness: Dana [name] is a 59 y.o. female presenting today for follow-up of COPD, allergic rhinitis. She was last seen in the office on 11/16/2019.  She states she has been doing well since last visit.  She states with trelegy every time she used it it made her cough.  She reports she has to use the rescue inhaler about once every 2-3 days which is an improvement from multi-use per day prior. She stopped montelukast as well as she said she looked up the side effects and said first thing she saw was cough listed.  She stopped it and states her cough improved however has not tried the Trelegy while off singulair.  She has been off the past 3 weeks.  She has not noted any increase in symptoms or albuterol use.  The astelin nasal spray helps with control of her nasal drainage.  She is currently not using flonase.  The olopatadine also helps with control of itchy/watery eyes.   She states she had a sleep study done that was ordered by her cardiology.  She reports it did show apneic episodes and recommended CPAP use however she states this has not been arranged for her yet.  At this time she does not see either a pulmonologist or a sleep medicine doctor.  She is wondering if I can help arrange and set-up her up with a CPAP device.     Review of systems in the past 4 weeks: Review of Systems  Constitutional: Negative.   HENT: Negative.   Eyes: Negative.   Respiratory: Positive for cough.   Cardiovascular: Negative.   Gastrointestinal: Negative.   Musculoskeletal: Negative.   Skin: Negative.   Neurological: Negative.     All other systems negative unless noted above in HPI  Past medical/social/surgical/family history have been reviewed and are unchanged unless specifically indicated below.  No changes  Medication List: Current Outpatient Medications    Medication Sig Dispense Refill  . albuterol (VENTOLIN HFA) 108 (90 Base) MCG/ACT inhaler Inhale into the lungs every 6 (six) hours as needed for wheezing or shortness of breath.    Marland Kitchen apixaban (ELIQUIS) 5 MG TABS tablet Take 1 tablet (5 mg total) by mouth 2 (two) times daily. 180 tablet 3  . atenolol (TENORMIN) 50 MG tablet Take 50 mg by mouth daily.    Marland Kitchen atorvastatin (LIPITOR) 20 MG tablet Take 1 tablet by mouth daily.    Marland Kitchen azelastine (ASTELIN) 0.1 % nasal spray Use 2 sprays in each nostril twice daily as needed 30 mL 5  . diphenhydrAMINE (BENADRYL) 25 MG tablet Take 25 mg by mouth. At bedtime    . fluticasone (FLONASE) 50 MCG/ACT nasal spray Place 2 sprays into both nostrils daily.    . Olopatadine HCl 0.2 % SOLN Place 1 drop into both eyes daily as needed. 2.5 mL 5  . pantoprazole (PROTONIX) 40 MG tablet Take 40 mg by mouth daily.    . sertraline (ZOLOFT) 100 MG tablet Take 150 mg by mouth daily.     Marland Kitchen Ubrogepant (UBRELVY) 50 MG TABS Take 50 mg by mouth daily as needed.    . Vitamin D, Ergocalciferol, (DRISDOL) 50000 units CAPS capsule Take 1 capsule by mouth once a week.     No current facility-administered medications for this visit.     Known medication allergies: No Known Allergies   Physical  examination: Blood pressure 120/62, pulse 60, resp. rate 20, SpO2 95 %.  General: Alert, interactive, in no acute distress. HEENT: PERRLA, TMs pearly gray, turbinates non-edematous without discharge, post-pharynx non erythematous. Neck: Supple without lymphadenopathy. Lungs: Clear to auscultation without wheezing, rhonchi or rales. {no increased work of breathing. CV: Normal S1, S2 without murmurs. Abdomen: Nondistended, nontender. Skin: Warm and dry, without lesions or rashes. Extremities:  No clubbing, cyanosis or edema. Neuro:   Grossly intact.  Diagnositics/Labs:  Spirometry: FEV1: 1.63L 55%, FVC: 2.07L 54%, ratio consistent with Restrictive pattern.  This is stable from prior  study  Assessment and plan:   COPD, emphysematous  -Symptoms and lung function are consistent with COPD.  She does not reverse with use of a bronchodilator.  - reported emphysema on lung imaging confirms COPD  -Advised her to try Trelegy off of the Singulair and see if she continues to have a cough with use.  If she does not then I would prefer her to be on Trelegy as a maintenance therapy for COPD   - have access to albuterol inhaler 2 puffs every 4-6 hours as needed for cough/wheeze/shortness of breath/chest tightness.  May use 15-20 minutes prior to activity.   Monitor frequency of use.    Allergic rhinitis with conjunctivitis  -Continue avoidance measures for molds, dust mite, horse, mouse and dog  - for nasal drainage continue use nasal antihistamine, Astelin 2 sprays each nostril twice a day as needed  - can use Flonase 2 sprays each nostril daily if having nasal congestion/stuffy nose  - recommend use of a long-acting antihistamine like Zyrtec 10mg , Allegra 180mg  or Xyzal 5mg  daily as needed for allergy symptom control  - for itchy/watery eyes can use Olapatadine 0.2% 1 drop each eye daily as needed  Obstructive sleep apnea  -She had a sleep apnea study by her cardiologist that she reports showed evidence of apneic events and recommended use of a CPAP device however she has not been able to have this arranged.  Unfortunately I am not able to prescribe her manage her CPAP for OSA and would recommend referral to a pulmonologist or sleep medicine doctor to do so.  follow-up in 3-6 months or sooner if needed I appreciate the opportunity to take part in Dana [name]'s care. Please do not hesitate to contact me with questions.  Sincerely,   Prudy Feeler, MD Allergy/Immunology [facility]

## 2020-02-16 ENCOUNTER — Telehealth: Payer: Self-pay | Admitting: Allergy

## 2020-02-16 NOTE — Telephone Encounter (Signed)
Dana [name] has been referred to Dr. Alcide Clever in [place].  I have faxed over the referral and notes.  They will reach out to [facility] and schedule her appointment.

## 2020-03-30 ENCOUNTER — Telehealth: Payer: Self-pay

## 2020-03-30 NOTE — Telephone Encounter (Signed)
-----   Message from Derwood Kaplan, MD sent at 03/29/2020  7:20 PM EDT ----- Regarding: mammo Tell her mammo is clear

## 2020-03-30 NOTE — Telephone Encounter (Signed)
Called patient to inform her that her Mammogram was clear.

## 2020-04-05 NOTE — Progress Notes (Signed)
[facility]  [address] [place],  [place]  [address] [phone]  Clinic Day:  04/05/2020  Referring physician: Jeanie Sewer, NP   This document serves as a record of services personally performed by Hosie Poisson, MD. It was created on their behalf by Curry,Lauren E, a trained medical scribe. The creation of this record is based on the scribe's personal observations and the provider's statements to them.   CHIEF COMPLAINT:  CC: History of stage I breast cancer  Current Treatment:  Surveillance   HISTORY OF PRESENT ILLNESS:  Dana [name] is a 59 y.o. female with a history of stage I breast cancer diagnosed in August 1999.  This was a 1.1 cm invasive ductal carcinoma, treated with lumpectomy, CMF chemotherapy, and radiation.  She did not tolerate tamoxifen, but has never had evidence of recurrence.  We began seeing her in August 2004, when she moved to the area.  She is on yearly follow-up, but was last seen in August of 2016.  We did CT scans in April of 2016, and found no evidence of malignancy, but there was a 6 mm nodule in the posterior right upper lobe on the CT chest.  She was seen in July with CT chest to follow up on the lung nodule, and it had resolved.  Due to her personal history of breast cancer diagnosed at age 49, we have recommended genetic testing with the [facility] Hereditary Cancer Panel testing on July 11th, 2016 with [facility] and that result was negative.  She has had a total abdominal hysterectomy for dysfunctional uterine bleeding, but her ovaries are intact.  She states her last colonoscopy was in 2011, and she is due for this again. She had 2 polyps removed at that time, one of which was a tubular adenoma.  She also has atrial fibrillation, history of pneumonia, mild to moderate tricuspid regurgitation, and history of prior stroke in 2014.  I had seen her in October 2017 and we evaluated multiple issues  that she was dealing with.  We had scheduled her in March but she did not keep the follow-up appointment.  However a mammogram at that time was clear.  She called this week with several issues including a lump in her left axilla, a squeezing pressure type pain of the chest over the weekend, now resolved.  She has also had some dizziness and took some Sudafed decongestant which helped.  She also notices increasing fatigue and does have dyspnea with exertion and sinusitis.  She feels warm, but denies any fever or chills.  She did have some drainage from this left axillary node a few months ago, and it was tender, but is feeling better now.  She tells me she has colonoscopy scheduled next week with Dr. Melina Copa.  She has seen Dr. Agustin Cree and she will be wearing a cardiac monitor next month for a month's time.  She is off the Prilosec and now on Protonix 40 mg daily and also on vitamin D2 1.25 mg weekly, in addition to her usual medications.  She did have her yearly bilateral mammogram this week, and is also due to follow up on a pulmonary nodule seen on CT of the chest in December.  She had a squamous cell carcinoma removed from her right shoulder, and a squamous cell carcinoma in situ removed from the left anterior chest.    INTERVAL HISTORY:  Dana [name] is here for annual follow up and states that she has  been well.  She states that she found out she was anemic back in the summer with a drop down to 9.9.  However, nothing has been done for evaluation or treatment and I would be glad to follow up on this.  She reports intermittent dizziness and dark stools.  She does have a history of peptic ulcer disease.  I will add iron studies, reticulocyte count, LDH, B12 and folate for further evaluation today and will order CBC and CMP as well.  She has had some anxiety and depression, and is scheduled with her primary care physician this December.  Her  appetite is good, and her weight is stable since her last visit.  She  denies fever, chills or other signs of infection.  She denies nausea, vomiting, bowel issues, or abdominal pain.  She denies sore throat, cough, dyspnea, or chest pain.   REVIEW OF SYSTEMS:  Review of Systems  Neurological: Positive for dizziness (intermittent).  Psychiatric/Behavioral: Positive for depression (mild). The patient is nervous/anxious (mild).   All other systems reviewed and are negative.    VITALS:  There were no vitals taken for this visit.  Wt Readings from Last 3 Encounters:  11/16/19 181 lb 3.2 oz (82.2 kg)  12/30/18 170 lb (77.1 kg)  01/16/18 175 lb 3.2 oz (79.5 kg)    There is no height or weight on file to calculate BMI.  Performance status (ECOG): 1 - Symptomatic but completely ambulatory  PHYSICAL EXAM:  Physical Exam Constitutional:      General: She is not in acute distress.    Appearance: Normal appearance. She is normal weight.  HENT:     Head: Normocephalic and atraumatic.  Eyes:     General: No scleral icterus.    Extraocular Movements: Extraocular movements intact.     Conjunctiva/sclera: Conjunctivae normal.     Pupils: Pupils are equal, round, and reactive to light.  Cardiovascular:     Rate and Rhythm: Normal rate and regular rhythm.     Pulses: Normal pulses.     Heart sounds: Normal heart sounds. No murmur heard.  No friction rub. No gallop.   Pulmonary:     Effort: Pulmonary effort is normal. No respiratory distress.     Breath sounds: Normal breath sounds.  Chest:     Breasts:        Right: Normal.        Left: Normal.     Comments: Both breasts are without masses Abdominal:     General: Bowel sounds are normal. There is no distension.     Palpations: Abdomen is soft. There is no mass.     Tenderness: There is no abdominal tenderness.  Musculoskeletal:        General: Normal range of motion.     Cervical back: Normal range of motion and neck supple.     Right lower leg: No edema.     Left lower leg: No edema.   Lymphadenopathy:     Cervical: No cervical adenopathy.  Skin:    General: Skin is warm and dry.     Coloration: Skin is pale.  Neurological:     General: No focal deficit present.     Mental Status: She is alert and oriented to person, place, and time. Mental status is at baseline.  Psychiatric:        Mood and Affect: Mood normal.        Behavior: Behavior normal.        Thought Content:  Thought content normal.        Judgment: Judgment normal.     LABS:   CBC Latest Ref Rng & Units 10/27/2017  WBC 3.4 - 10.8 x10E3/uL 8.8  Hemoglobin 11.1 - 15.9 g/dL 12.5  Hematocrit 34.0 - 46.6 % 37.6  Platelets 150 - 450 x10E3/uL 210   No flowsheet data found.   No results found for: TIBC, FERRITIN, IRONPCTSAT No results found for: LDH   STUDIES:   She underwent a CT neck with contrast on 02/17/2019 showing: 1. No evidence of acute abnormality or mass in the neck. 2.  Emphysema (ICD10-J43.9).  She underwent CT chest without contrast on 02/15/2020 showing: 1. Stable, benign small pulmonary nodules. No further routine CT follow-up is required for these nodules. Consider ongoing annual low-dose CT lung cancer screening if indicated by patient age, smoking history, and/or other risk factors for lung cancer. 2. Mild subpleural radiation fibrosis of the anterior right lung in keeping with prior breast cancer treatment. 3. Emphysema (ICD10-J43.9). 4. Coronary artery disease. Aortic Atherosclerosis (ICD10-I70.0).  She underwent digital screening bilateral mammogram with tomography on 03/24/2020 showing: breast density category B.  No mammographic evidence of malignancy.  Allergies: No Known Allergies  Current Medications: Current Outpatient Medications  Medication Sig Dispense Refill  . albuterol (VENTOLIN HFA) 108 (90 Base) MCG/ACT inhaler Inhale into the lungs every 6 (six) hours as needed for wheezing or shortness of breath.    Marland Kitchen apixaban (ELIQUIS) 5 MG TABS tablet Take 1 tablet (5 mg  total) by mouth 2 (two) times daily. 180 tablet 3  . atenolol (TENORMIN) 50 MG tablet Take 50 mg by mouth daily.    Marland Kitchen atorvastatin (LIPITOR) 20 MG tablet Take 1 tablet by mouth daily.    Marland Kitchen azelastine (ASTELIN) 0.1 % nasal spray Use 2 sprays in each nostril twice daily as needed 30 mL 5  . diphenhydrAMINE (BENADRYL) 25 MG tablet Take 25 mg by mouth. At bedtime    . fluticasone (FLONASE) 50 MCG/ACT nasal spray Place 2 sprays into both nostrils daily.    . Olopatadine HCl 0.2 % SOLN Place 1 drop into both eyes daily as needed. 2.5 mL 5  . pantoprazole (PROTONIX) 40 MG tablet Take 40 mg by mouth daily.    . sertraline (ZOLOFT) 100 MG tablet Take 150 mg by mouth daily.     Marland Kitchen Ubrogepant (UBRELVY) 50 MG TABS Take 50 mg by mouth daily as needed.    . Vitamin D, Ergocalciferol, (DRISDOL) 50000 units CAPS capsule Take 1 capsule by mouth once a week.     No current facility-administered medications for this visit.     ASSESSMENT & PLAN:   Assessment:   1.  Stage I breast cancer diagnosed in August 1999.  She remains without evidence of recurrence.  2.  Anemia, first appreciated in August with a hemoglobin of 9.9 with an MCV of 76,7, so I will repeat a CBC.  This has not been evaluated so I will add irons studies, reticulocyte count, LDH, B12 and folate for further evaluation.  She has had dark stools, so I advise that she be evaluated by GI.  3.  Intermittent dizziness and fatigue, likely secondary to her anemia.  4.  Former smoker with COPD.  We will continue annual low dose lung cancer screening CT scans.  Plan: As she found she was anemic back in the summer, I will evaluate further with iron studies, reticulocyte count, LDH, B12 and folate.  I will also order  CBC and CMP today, and call her with her lab results.  She reports dark stools, and states that she is scheduled to meet with Dr. Melina Copa in December, so I advise that she be evaluated with EGD and colonoscopy.  She has had a history of  peptic ulcers.  I will see her back in 2-3 months with CBC and CMP for repeat evaluation.  She understands and agrees with this plan of care.   I provided 23 minutes (2:25 PM - 1:48 PM) of face-to-face time during this this encounter and > 50% was spent counseling as documented under my assessment and plan.    Derwood Kaplan, MD [facility] [address] [place] [address] Dept: [phone] Dept Fax: [phone]   I, Rita Ohara, am acting as scribe for Derwood Kaplan, MD  I have reviewed this report as typed by the medical scribe, and it is complete and accurate.

## 2020-04-06 ENCOUNTER — Inpatient Hospital Stay: Payer: Medicare Other | Attending: Oncology | Admitting: Oncology

## 2020-04-06 ENCOUNTER — Inpatient Hospital Stay: Payer: Medicare Other

## 2020-04-06 ENCOUNTER — Encounter: Payer: Self-pay | Admitting: Oncology

## 2020-04-06 ENCOUNTER — Other Ambulatory Visit: Payer: Self-pay

## 2020-04-06 ENCOUNTER — Other Ambulatory Visit: Payer: Self-pay | Admitting: Oncology

## 2020-04-06 VITALS — BP 151/72 | HR 56 | Temp 98.3°F | Resp 18 | Ht 67.5 in | Wt 181.5 lb

## 2020-04-06 DIAGNOSIS — R42 Dizziness and giddiness: Secondary | ICD-10-CM | POA: Diagnosis not present

## 2020-04-06 DIAGNOSIS — D649 Anemia, unspecified: Secondary | ICD-10-CM | POA: Insufficient documentation

## 2020-04-06 DIAGNOSIS — Z853 Personal history of malignant neoplasm of breast: Secondary | ICD-10-CM | POA: Insufficient documentation

## 2020-04-06 DIAGNOSIS — F32A Depression, unspecified: Secondary | ICD-10-CM | POA: Insufficient documentation

## 2020-04-06 DIAGNOSIS — Z87891 Personal history of nicotine dependence: Secondary | ICD-10-CM | POA: Diagnosis not present

## 2020-04-06 DIAGNOSIS — D500 Iron deficiency anemia secondary to blood loss (chronic): Secondary | ICD-10-CM | POA: Diagnosis not present

## 2020-04-06 DIAGNOSIS — Z7901 Long term (current) use of anticoagulants: Secondary | ICD-10-CM | POA: Diagnosis not present

## 2020-04-06 DIAGNOSIS — Z8673 Personal history of transient ischemic attack (TIA), and cerebral infarction without residual deficits: Secondary | ICD-10-CM | POA: Insufficient documentation

## 2020-04-06 DIAGNOSIS — J449 Chronic obstructive pulmonary disease, unspecified: Secondary | ICD-10-CM | POA: Diagnosis not present

## 2020-04-06 DIAGNOSIS — D539 Nutritional anemia, unspecified: Secondary | ICD-10-CM

## 2020-04-06 DIAGNOSIS — Z79899 Other long term (current) drug therapy: Secondary | ICD-10-CM | POA: Diagnosis not present

## 2020-04-06 DIAGNOSIS — I4891 Unspecified atrial fibrillation: Secondary | ICD-10-CM | POA: Diagnosis not present

## 2020-04-06 LAB — CBC WITH DIFFERENTIAL/PLATELET
Abs Immature Granulocytes: 0.02 10*3/uL (ref 0.00–0.07)
Basophils Absolute: 0 10*3/uL (ref 0.0–0.1)
Basophils Relative: 0 %
Eosinophils Absolute: 0.4 10*3/uL (ref 0.0–0.5)
Eosinophils Relative: 5 %
HCT: 37.2 % (ref 36.0–46.0)
Hemoglobin: 11.7 g/dL — ABNORMAL LOW (ref 12.0–15.0)
Immature Granulocytes: 0 %
Lymphocytes Relative: 23 %
Lymphs Abs: 1.9 10*3/uL (ref 0.7–4.0)
MCH: 25.6 pg — ABNORMAL LOW (ref 26.0–34.0)
MCHC: 31.5 g/dL (ref 30.0–36.0)
MCV: 81.4 fL (ref 80.0–100.0)
Monocytes Absolute: 0.9 10*3/uL (ref 0.1–1.0)
Monocytes Relative: 11 %
Neutro Abs: 4.9 10*3/uL (ref 1.7–7.7)
Neutrophils Relative %: 61 %
Platelets: 237 10*3/uL (ref 150–400)
RBC: 4.57 MIL/uL (ref 3.87–5.11)
RDW: 14.4 % (ref 11.5–15.5)
WBC: 8.1 10*3/uL (ref 4.0–10.5)
nRBC: 0 % (ref 0.0–0.2)

## 2020-04-06 LAB — IRON AND TIBC
Iron: 46 ug/dL (ref 28–170)
Saturation Ratios: 10 % — ABNORMAL LOW (ref 10.4–31.8)
TIBC: 467 ug/dL — ABNORMAL HIGH (ref 250–450)
UIBC: 421 ug/dL

## 2020-04-06 LAB — COMPREHENSIVE METABOLIC PANEL (ASHBORO CC SCANNED REPORT)

## 2020-04-06 LAB — FERRITIN: Ferritin: 12 ng/mL (ref 11–307)

## 2020-04-06 LAB — LACTATE DEHYDROGENASE: LDH: 163 U/L (ref 98–192)

## 2020-04-06 LAB — VITAMIN B12: Vitamin B-12: 285 pg/mL (ref 180–914)

## 2020-04-08 ENCOUNTER — Telehealth: Payer: Self-pay | Admitting: Oncology

## 2020-04-08 NOTE — Telephone Encounter (Signed)
No LOL listed on 11/11 at check out

## 2020-04-10 ENCOUNTER — Telehealth: Payer: Self-pay

## 2020-04-10 NOTE — Telephone Encounter (Signed)
-----   Message from Derwood Kaplan, MD sent at 04/06/2020  6:55 PM EST ----- Regarding: call pt Tell her she is low on iron so I rec 1 daily but her hgb is already improved a lot, up to 11.7. Her B12 is borderline so I rec she take 1 daily of that as well, 500 mcg

## 2020-04-10 NOTE — Telephone Encounter (Signed)
Patient notified

## 2020-04-11 ENCOUNTER — Telehealth: Payer: Self-pay

## 2020-04-11 NOTE — Telephone Encounter (Signed)
-----   Message from Derwood Kaplan, MD sent at 04/10/2020  6:39 PM EST ----- Regarding: call pt Tell her hgb is 11.7 but she is clearly iron deficient & needs to see GI

## 2020-04-11 NOTE — Telephone Encounter (Signed)
Patient notified and states she has an appointment with Dr. Melina Copa on 05/09/20 for a follow up visit.

## 2020-05-15 ENCOUNTER — Other Ambulatory Visit: Payer: Self-pay | Admitting: Allergy

## 2020-06-05 ENCOUNTER — Telehealth: Payer: Self-pay | Admitting: Cardiovascular Disease

## 2020-06-05 NOTE — Telephone Encounter (Signed)
Patient is requesting a new order for a CPAP machine.

## 2020-06-12 NOTE — Telephone Encounter (Signed)
-----   Message from Freada Bergeron, [place] sent at 06/08/2020  5:28 PM EST ----- Regarding: New order Requesting a new order for cpap

## 2020-06-12 NOTE — Telephone Encounter (Signed)
Staff message sent to scheduler, Marva Panda patient needs appointment to see Dr Claiborne Billings for OSA. Patient called in requesting a machine.

## 2020-07-21 ENCOUNTER — Telehealth: Payer: Medicare Other | Admitting: Cardiovascular Disease

## 2020-07-24 ENCOUNTER — Telehealth: Payer: Self-pay | Admitting: Oncology

## 2020-07-24 NOTE — Telephone Encounter (Signed)
07/24/20 Spoke with patient and sched appts

## 2020-07-27 ENCOUNTER — Encounter: Payer: Self-pay | Admitting: Cardiovascular Disease

## 2020-07-27 ENCOUNTER — Ambulatory Visit (INDEPENDENT_AMBULATORY_CARE_PROVIDER_SITE_OTHER): Payer: Medicare Other | Admitting: Cardiovascular Disease

## 2020-07-27 ENCOUNTER — Other Ambulatory Visit: Payer: Self-pay

## 2020-07-27 VITALS — BP 130/80 | HR 68 | Ht 68.0 in | Wt 181.6 lb

## 2020-07-27 DIAGNOSIS — G4719 Other hypersomnia: Secondary | ICD-10-CM | POA: Diagnosis not present

## 2020-07-27 DIAGNOSIS — Z7901 Long term (current) use of anticoagulants: Secondary | ICD-10-CM

## 2020-07-27 DIAGNOSIS — G4733 Obstructive sleep apnea (adult) (pediatric): Secondary | ICD-10-CM | POA: Diagnosis not present

## 2020-07-27 DIAGNOSIS — I480 Paroxysmal atrial fibrillation: Secondary | ICD-10-CM | POA: Diagnosis not present

## 2020-07-27 DIAGNOSIS — Z720 Tobacco use: Secondary | ICD-10-CM

## 2020-07-27 NOTE — Progress Notes (Signed)
Cardiology Office Note    Date:  07/29/2020   ID:  Dana [name], Dana [name] [birth year], MRN [id number]  PCP:  Jeanie Sewer, NP  Cardiologist:  Shelva Majestic, MD (sleep); Dr. Jenne Campus  New sleep evaluation  History of Present Illness:  Dana [name] is a 60 y.o. female who is followed by Dr. Jenne Campus for cardiology care.  She has a history of paroxysmal atrial fibrillation, hypertension, hyperlipidemia, and also had experienced chest pain in the past.  Due to concern for obstructive sleep apnea, he referred her for a home sleep study which was done on December 30, 2018.  At that time, she had symptoms of snoring, excessive daytime sleepiness with an Epworth Sleepiness Scale score of 14, nonrestorative sleep, as well as significant fatigability and nocturia at least 2-3 times per night.  On her home study, she was found to have severe sleep apnea with an AHI of 30.1/h.  She had significant oxygen desaturation to a nadir of 80% and time spent below 89% was approximately 45 minutes.  The patient was to undergo a CPAP titration trial and this was tentatively scheduled for January 18, 2019.  However unfortunately due to the Covid pandemic, in lab sleep studies were canceled.  She was never given a follow-up appointment and we were never notified of this so as a result she never instituted AutoPap therapy.  She has a history of breast cancer diagnosed in 1999 and underwent lymph node dissection.  She also has a history of skin cancer and underwent Mohs surgery on her scalp.  She is felt to have COPD.  She has remote tobacco history for over 35 years and quit smoking in 2014.  Presently, she typically goes to bed between 9 and 10 PM but it takes her 2 hours to fall asleep since she often watches television in bed.  She often wakes up at 9 AM.  Her current sleep is nonrestorative.  She snores.  She admits to daytime sleepiness.  An Epworth Sleepiness Scale score was calculated in the  office today and this endorsed at 14 as shown below:  Epworth Sleepiness Scale: Situation   Chance of Dozing/Sleeping (0 = never , 1 = slight chance , 2 = moderate chance , 3 = high chance )   sitting and reading 3   watching TV 3   sitting inactive in a public place 1   being a passenger in a motor vehicle for an hour or more 2   lying down in the afternoon 3   sitting and talking to someone 0   sitting quietly after lunch (no alcohol) 2   while stopped for a few minutes in traffic as the driver 0   Total Score  14   She is unaware of any bruxism, hypnagogic hallucinations or cataplectic events.  She often uses melatonin to aid with sleep.   Past Medical History:  Diagnosis Date  . Asthma   . Breast cancer ([place])   . COPD (chronic obstructive pulmonary disease) ([place])   . Hyperthyroidism 01/04/2016  . Late effects of CVA (cerebrovascular accident) 03/25/2016  . Obstructive sleep apnea syndrome 03/25/2016  . Paroxysmal atrial fibrillation ([place]) 03/25/2016    Past Surgical History:  Procedure Laterality Date  . ABDOMINAL HYSTERECTOMY    . APPENDECTOMY    . BREAST BIOPSY    . lymph node removal    . PORTACATH PLACEMENT    . portacath removal    . TONSILLECTOMY  Current Medications: Outpatient Medications Prior to Visit  Medication Sig Dispense Refill  . albuterol (VENTOLIN HFA) 108 (90 Base) MCG/ACT inhaler Inhale into the lungs every 6 (six) hours as needed for wheezing or shortness of breath.    Marland Kitchen apixaban (ELIQUIS) 5 MG TABS tablet Take 1 tablet (5 mg total) by mouth 2 (two) times daily. 180 tablet 3  . atenolol (TENORMIN) 50 MG tablet Take 50 mg by mouth daily.    Marland Kitchen atorvastatin (LIPITOR) 20 MG tablet Take 1 tablet by mouth daily.    Marland Kitchen azelastine (ASTELIN) 0.1 % nasal spray Use 2 sprays in each nostril twice daily as needed 30 mL 2  . furosemide (LASIX) 20 MG tablet Take 20 mg by mouth daily as needed.    . Olopatadine HCl 0.2 % SOLN Place 1 drop into both eyes  daily as needed. 2.5 mL 5  . pantoprazole (PROTONIX) 40 MG tablet Take 40 mg by mouth daily.    . sertraline (ZOLOFT) 100 MG tablet Take 150 mg by mouth daily.     . Vitamin D, Ergocalciferol, (DRISDOL) 50000 units CAPS capsule Take 1 capsule by mouth once a week.    Marland Kitchen Ubrogepant (UBRELVY) 50 MG TABS Take 50 mg by mouth daily as needed.     No facility-administered medications prior to visit.     Allergies:   Patient has no known allergies.   Social History   Socioeconomic History  . Marital status: Married    Spouse name: Not on file  . Number of children: Not on file  . Years of education: Not on file  . Highest education level: Not on file  Occupational History  . Not on file  Tobacco Use  . Smoking status: Former Smoker    Types: Cigarettes    Quit date: 02/24/2013    Years since quitting: 7.4  . Smokeless tobacco: Never Used  Vaping Use  . Vaping Use: Never used  Substance and Sexual Activity  . Alcohol use: Yes    Comment: Once a year  . Drug use: No  . Sexual activity: Not on file  Other Topics Concern  . Not on file  Social History Narrative  . Not on file   Social Determinants of Health   Financial Resource Strain: Not on file  Food Insecurity: Not on file  Transportation Needs: Not on file  Physical Activity: Not on file  Stress: Not on file  Social Connections: Not on file    Socially she is in her second marriage, currently 34 years.  She has 2 children.  She had smoked for 35 years and quit in 2014.  She is retired and previously was a [employer]ar[employer] manager.  She retired at age 75.  Family History:  The patient's family history includes Hypertension in her maternal grandmother and mother; Lung cancer in her maternal uncle; Ovarian cancer in her paternal aunt and paternal grandfather.   Her father is deceased and had pancreatic cancer.  Mother is alive at age 48 with dementia and has sleep apnea.  She has 2 brothers and 1 sister is gravely ill  and dying.  ROS General: Negative; No fevers, chills, or night sweats;  HEENT: Negative; No changes in vision or hearing, sinus congestion, difficulty swallowing Pulmonary: COPD Cardiovascular: Negative; No chest pain, presyncope, syncope, palpitations GI: Negative; No nausea, vomiting, diarrhea, or abdominal pain GU: Negative; No dysuria, hematuria, or difficulty voiding Musculoskeletal: Negative; no myalgias, joint pain, or weakness Hematologic/Oncology: History of breast  cancer Endocrine: Negative; no heat/cold intolerance; no diabetes Neuro: Negative; no changes in balance, headaches Skin: Negative; No rashes or skin lesions Psychiatric: Negative; No behavioral problems, depression Sleep: See HPI Other comprehensive 14 point system review is negative.   PHYSICAL EXAM:   VS:  BP 130/80   Pulse 68   Ht 5\' 8"  (1.727 m)   Wt 181 lb 9.6 oz (82.4 kg)   BMI 27.61 kg/m       Wt Readings from Last 3 Encounters:  07/27/20 181 lb 9.6 oz (82.4 kg)  04/06/20 181 lb 8 oz (82.3 kg)  11/16/19 181 lb 3.2 oz (82.2 kg)    General: Alert, oriented, no distress.  Skin: normal turgor, no rashes, warm and dry HEENT: Normocephalic, atraumatic. Pupils equal round and reactive to light; sclera anicteric; extraocular muscles intact;  Nose without nasal septal hypertrophy Mouth/Parynx benign; Mallinpatti scale 3 Neck: No JVD, no carotid bruits; normal carotid upstroke Lungs: clear to ausculatation and percussion; no wheezing or rales Chest wall: without tenderness to palpitation Heart: PMI not displaced, RRR, s1 s2 normal, 1/6 systolic murmur, no diastolic murmur, no rubs, gallops, thrills, or heaves Abdomen: soft, nontender; no hepatosplenomehaly, BS+; abdominal aorta nontender and not dilated by palpation. Back: no CVA tenderness Pulses 2+ Musculoskeletal: full range of motion, normal strength, no joint deformities Extremities: no clubbing cyanosis or edema, Homan's sign negative   Neurologic: grossly nonfocal; Cranial nerves grossly wnl Psychologic: Normal mood and affect   Studies/Labs Reviewed:   ECG (independently read by me): NSR at 68; no ectopy  Recent Labs: No flowsheet data found.   No flowsheet data found.  CBC Latest Ref Rng & Units 04/06/2020 10/27/2017  WBC 4.0 - 10.5 K/uL 8.1 8.8  Hemoglobin 12.0 - 15.0 g/dL 11.7(L) 12.5  Hematocrit 36.0 - 46.0 % 37.2 37.6  Platelets 150 - 400 K/uL 237 210   Lab Results  Component Value Date   MCV 81.4 04/06/2020   MCV 84 10/27/2017   No results found for: TSH No results found for: HGBA1C   BNP No results found for: BNP  ProBNP No results found for: PROBNP   Lipid Panel  No results found for: CHOL, TRIG, HDL, CHOLHDL, VLDL, LDLCALC, LDLDIRECT, LABVLDL   RADIOLOGY: No results found.   Additional studies/ records that were reviewed today include:   12/30/2018 CLINICAL INFORMATION Sleep Study Type: HST  Indication for sleep study: OSA  Epworth Sleepiness Score: 14  SLEEP STUDY TECHNIQUE A multi-channel overnight portable sleep study was performed. The channels recorded were: nasal airflow, thoracic respiratory movement, and oxygen saturation with a pulse oximetry. Snoring was also monitored.  MEDICATIONS     apixaban (ELIQUIS) 5 MG TABS tablet         atenolol-chlorthalidone (TENORETIC) 100-25 MG tablet         atorvastatin (LIPITOR) 20 MG tablet         fluticasone furoate-vilanterol (BREO ELLIPTA) 200-25 MCG/INH AEPB         montelukast (SINGULAIR) 10 MG tablet         pantoprazole (PROTONIX) 40 MG tablet         sertraline (ZOLOFT) 100 MG tablet         Vitamin D, Ergocalciferol, (DRISDOL) 50000 units CAPS capsule      Patient self administered medications include: N/A.  SLEEP ARCHITECTURE Patient was studied for 534 minutes. The sleep efficiency was 100.0 % and the patient was supine for 82.9%. The arousal index was 0.0 per hour.  RESPIRATORY PARAMETERS The overall AHI  was 30.1 per hour, with a central apnea index of 0.1 per hour.  The oxygen nadir was 80% during sleep.  CARDIAC DATA Mean heart rate during sleep was 61.8 bpm.  IMPRESSIONS - Severe obstructive sleep apnea occurred during this study (AHI  30.1/h). There is a significant positional component with supine sleep AHI 32.8/h vs non-supine sleep AHI 17.07/h. - No significant central sleep apnea occurred during this study (CAI = 0.1/h). - Severe oxygen desaturation was noted during this study to a nadir of 80%. Time spent below 89% was 44.7 minutes. - Patient snored 7.5% during the sleep.  DIAGNOSIS - Obstructive Sleep Apnea (327.23 [G47.33 ICD-10]) - Nocturnal Hypoxemia (327.26 [G47.36 ICD-10])  RECOMMENDATIONS - In this patient with significant cardiovascular comorbidities and severe OSA recommend an in-lab CPAP titration study. - Effort should be made to optimize nasal and oropharyngeal patnecy. - Positional therapy avoiding supine position during sleep. - Avoid alcohol, sedatives and other CNS depressants that may worsen sleep apnea and disrupt normal sleep architecture. - Sleep hygiene should be reviewed to assess factors that may improve sleep quality. - Weight management and regular exercise should be initiated or continued. - Recommend a sleep clinic evaluation after CPAP initiation.   ASSESSMENT:    1. Obstructive sleep apnea syndrome   2. Excessive daytime sleepiness   3. Paroxysmal atrial fibrillation (HCC)   4. Anticoagulated   5. Remote Tobacco abuse: quit 2014      PLAN:  Ms. Shanita Kanan is a very pleasant 60 year old female who has a history of hypertension, hyperlipidemia, prior longstanding tobacco history, COPD and paroxysmal atrial fibrillation.  Due to concerns for obstructive sleep apnea she underwent a home sleep study on December 30, 2018.  On this home study she was found to have severe sleep apnea with an AHI of 30.1/h.  Events were more significant with  supine sleep with an AHI of 32.8/h.  The severity during REM sleep could not be assessed on this home study.  There was significant oxygen desaturation to a nadir of 80% and she spent 45 minutes less than 89%.  Unfortunately, she was scheduled for CPAP titration but ultimately this was canceled due to the Covid pandemic and she was inadvertently never rescheduled.  In addition we were never notified such that she never instituted AutoPap therapy.  I had a long discussion with her today in the office and reviewed her diagnostic study.  I also reviewed normal sleep architecture and the effects of sleep apnea on abnormal sleep architecture.  I reviewed adverse potential cardiovascular consequences if sleep apnea is untreated particularly with reference to hypertension, nocturnal arrhythmias, atrial fibrillation recurrence particularly since she already has had atrial fibrillation, as well as potential for nocturnal ischemia resulting from her sleep disordered breathing induced hypoxemia.  Presently she is symptomatic with daytime sleepiness, snoring, frequent awakenings and nonrestorative sleep.  I discussed proper sleep hygiene and recommended that the bed be used for sleep and ideally television watching should not be done in bed and delaying her sleep onset.  With the previous documented severe sleep apnea, I am recommending she undergo an in lab split-night study and will try to expedite this particular due to her previous significant delay.  If she meet split-night criteria CPAP titration will be undertaken at the same time.  During that study we will also be able to assess her REM sleep and optimal titration.  Presently, her blood pressure is upper normal and she is  without recurrent awareness of atrial fibrillation since her initial episode in August 2021.  She is on atenolol 50 mg daily and continues to be on anticoagulation with Eliquis 5 mg twice a day.  She is on atorvastatin 20 mg for hyperlipidemia.  LDL  cholesterol was increased at 106 in September 2021.  She has been followed by Dr. Nelva Bush for pulmonary/allergy issues.  I will see her in 3 months for follow-up evaluation   Medication Adjustments/Labs and Tests Ordered: Current medicines are reviewed at length with the patient today.  Concerns regarding medicines are outlined above.  Medication changes, Labs and Tests ordered today are listed in the Patient Instructions below. Patient Instructions  Medication Instructions:  The current medical regimen is effective;  continue present plan and medications.  *If you need a refill on your cardiac medications before your next appointment, please call your pharmacy*   Testing/Procedures: Your physician has recommended that you have a sleep study. This test records several body functions during sleep, including: brain activity, eye movement, oxygen and carbon dioxide blood levels, heart rate and rhythm, breathing rate and rhythm, the flow of air through your mouth and nose, snoring, body muscle movements, and chest and belly movement.     Follow-Up: At [facility], you and your health needs are our priority.  As part of our continuing mission to provide you with exceptional heart care, we have created designated Provider Care Teams.  These Care Teams include your primary Cardiologist (physician) and Advanced Practice Providers (APPs -  Physician Assistants and Nurse Practitioners) who all work together to provide you with the care you need, when you need it.  We recommend signing up for the patient portal called "MyChart".  Sign up information is provided on this After Visit Summary.  MyChart is used to connect with patients for Virtual Visits (Telemedicine).  Patients are able to view lab/test results, encounter notes, upcoming appointments, etc.  Non-urgent messages can be sent to your provider as well.   To learn more about what you can do with MyChart, go to [email or web address].    Your  next appointment:   3-4 month(s)  The format for your next appointment:   In Person  Provider:   Shelva Majestic, MD        Signed, Shelva Majestic, MD  07/29/2020 1:16 PM    [facility] [address] [place], [place], [place]  [address] Phone: [phone]

## 2020-07-27 NOTE — Patient Instructions (Signed)
Medication Instructions:  The current medical regimen is effective;  continue present plan and medications.  *If you need a refill on your cardiac medications before your next appointment, please call your pharmacy*   Testing/Procedures: Your physician has recommended that you have a sleep study. This test records several body functions during sleep, including: brain activity, eye movement, oxygen and carbon dioxide blood levels, heart rate and rhythm, breathing rate and rhythm, the flow of air through your mouth and nose, snoring, body muscle movements, and chest and belly movement.     Follow-Up: At [facility], you and your health needs are our priority.  As part of our continuing mission to provide you with exceptional heart care, we have created designated Provider Care Teams.  These Care Teams include your primary Cardiologist (physician) and Advanced Practice Providers (APPs -  Physician Assistants and Nurse Practitioners) who all work together to provide you with the care you need, when you need it.  We recommend signing up for the patient portal called "MyChart".  Sign up information is provided on this After Visit Summary.  MyChart is used to connect with patients for Virtual Visits (Telemedicine).  Patients are able to view lab/test results, encounter notes, upcoming appointments, etc.  Non-urgent messages can be sent to your provider as well.   To learn more about what you can do with MyChart, go to [email or web address].    Your next appointment:   3-4 month(s)  The format for your next appointment:   In Person  Provider:   Shelva Majestic, MD

## 2020-07-28 ENCOUNTER — Telehealth: Payer: Self-pay

## 2020-07-28 ENCOUNTER — Other Ambulatory Visit: Payer: Self-pay | Admitting: Oncology

## 2020-07-28 DIAGNOSIS — C4440 Unspecified malignant neoplasm of skin of scalp and neck: Secondary | ICD-10-CM

## 2020-07-28 NOTE — Telephone Encounter (Signed)
-----   Message from Derwood Kaplan, MD sent at 07/28/2020  2:53 PM EST ----- Regarding: RE: Pt req CT scan of head and neck before visit Would you request the path from his office? ----- Message ----- From: Dairl Ponder, RN Sent: 07/28/2020  12:33 PM EST To: Derwood Kaplan, MD Subject: Pt req CT scan of head and neck before visit   Pt LVM on nurse triage line asking if you would order a CT scan of her head and neck. She mentions she has had some skin cancers removed using the Mohs procedure by Dr Loyal Jacobson last March. However some of these have grown back & have been biopsied (positive for squamous cell). [phone]   I called pt to make sure I got all information correctly. Pt states that the skin center wants her to have another Mohs' procedure. She isn't sure wants to do that again. She doesn't have a lot of confidence in the dermatology office.

## 2020-07-28 NOTE — Telephone Encounter (Signed)
Pt LVM on nurse triage line asking if you would order a CT scan of her head and neck. She mentions she has had some skin cancers removed using the Mohs procedure by Dr Loyal Jacobson last March. However some of these have grown back & have been biopsied (positive for squamous cell). [phone]   I called pt to make sure I got all information correctly. Pt states that the skin center wants her to have another Mohs' procedure. She isn't sure wants to do that again. She doesn't have a lot of confidence in the dermatology office.

## 2020-07-28 NOTE — Telephone Encounter (Signed)
RE: Pt req CT scan of head and neck before visit Received: Today Derwood Kaplan, MD  Dairl Ponder, RN; Foster Simpson, Denise Called pt., she had deep squamous cell carcinoma of scalp, nearly down to bone, resected  Now positive for cancer at the scar and feels there are other areas of her forehead and face.   I will order CT of head and neck, pls schedule so I have results when I see her 3/14  Let's do the labs day of scan rather than 3/14  I asked her to find out if they can resect, may need consult XRT

## 2020-07-29 ENCOUNTER — Encounter: Payer: Self-pay | Admitting: Cardiovascular Disease

## 2020-08-04 ENCOUNTER — Telehealth: Payer: Self-pay | Admitting: Oncology

## 2020-08-04 NOTE — Progress Notes (Incomplete)
[facility]  [address] [place],  [place]  [address] [phone]  Clinic Day:  08/04/2020  Referring physician: Jeanie Sewer, NP   This document serves as a record of services personally performed by Hosie Poisson, MD. It was created on their behalf by Curry,Lauren E, a trained medical scribe. The creation of this record is based on the scribe's personal observations and the provider's statements to them.   CHIEF COMPLAINT:  CC: History of stage I breast cancer  Current Treatment:  Surveillance   HISTORY OF PRESENT ILLNESS:  Dana [name] is a 60 y.o. female with a history of stage I breast cancer diagnosed in August 1999.  This was a 1.1 cm invasive ductal carcinoma, treated with lumpectomy, CMF chemotherapy, and radiation.  She did not tolerate tamoxifen, but has never had evidence of recurrence.  We began seeing her in August 2004, when she moved to the area.  She is on yearly follow-up, but was last seen in August of 2016.  We did CT scans in April of 2016, and found no evidence of malignancy, but there was a 6 mm nodule in the posterior right upper lobe on the CT chest.  She was seen in July with CT chest to follow up on the lung nodule, and it had resolved.  Due to her personal history of breast cancer diagnosed at age 20, we have recommended genetic testing with the [facility] Hereditary Cancer Panel testing on July 11th, 2016 with [place] and that result was negative.  She has had a total abdominal hysterectomy for dysfunctional uterine bleeding, but her ovaries are intact.  She states her last colonoscopy was in 2011, and she is due for this again. She had 2 polyps removed at that time, one of which was a tubular adenoma.  She also has atrial fibrillation, history of pneumonia, mild to moderate tricuspid regurgitation, and history of prior stroke in 2014.  I had seen her in October 2017 and we evaluated multiple issues  that she was dealing with.  We had scheduled her in March but she did not keep the follow-up appointment.  However a mammogram at that time was clear.  She called this week with several issues including a lump in her left axilla, a squeezing pressure type pain of the chest over the weekend, now resolved.  She has also had some dizziness and took some Sudafed decongestant which helped.  She also notices increasing fatigue and does have dyspnea with exertion and sinusitis.  She feels warm, but denies any fever or chills.  She did have some drainage from this left axillary node a few months ago, and it was tender, but is feeling better now.  She tells me she has colonoscopy scheduled next week with Dr. Melina Copa.  She has seen Dr. Agustin Cree and she will be wearing a cardiac monitor next month for a month's time.  She is off the Prilosec and now on Protonix 40 mg daily and also on vitamin D2 1.25 mg weekly, in addition to her usual medications.  She did have her yearly bilateral mammogram this week, and is also due to follow up on a pulmonary nodule seen on CT of the chest in December.  She had a squamous cell carcinoma removed from her right shoulder, and a squamous cell carcinoma in situ removed from the left anterior chest.    INTERVAL HISTORY:  Danese is here for annual follow up and states that she has  been well.  She states that she found out she was anemic back in the summer with a drop down to 9.9.  However, nothing has been done for evaluation or treatment and I would be glad to follow up on this.  She reports intermittent dizziness and dark stools.  She does have a history of peptic ulcer disease.  I will add iron studies, reticulocyte count, LDH, B12 and folate for further evaluation today and will order CBC and CMP as well.  She has had some anxiety and depression, and is scheduled with her primary care physician this December.  Her  appetite is good, and her weight is stable since her last visit.  She  denies fever, chills or other signs of infection.  She denies nausea, vomiting, bowel issues, or abdominal pain.  She denies sore throat, cough, dyspnea, or chest pain.  Dana [name] if here for routine follow up ***.   Her  appetite is good, and she has gained/lost _ pounds since her last visit.  She denies fever, chills or other signs of infection.  She denies nausea, vomiting, bowel issues, or abdominal pain.  She denies sore throat, cough, dyspnea, or chest pain.  REVIEW OF SYSTEMS:  Review of Systems - Oncology   VITALS:  There were no vitals taken for this visit.  Wt Readings from Last 3 Encounters:  07/27/20 181 lb 9.6 oz (82.4 kg)  04/06/20 181 lb 8 oz (82.3 kg)  11/16/19 181 lb 3.2 oz (82.2 kg)    There is no height or weight on file to calculate BMI.  Performance status (ECOG): 1 - Symptomatic but completely ambulatory  PHYSICAL EXAM:  Physical Exam  LABS:   CBC Latest Ref Rng & Units 04/06/2020 10/27/2017  WBC 4.0 - 10.5 K/uL 8.1 8.8  Hemoglobin 12.0 - 15.0 g/dL 11.7(L) 12.5  Hematocrit 36.0 - 46.0 % 37.2 37.6  Platelets 150 - 400 K/uL 237 210   No flowsheet data found.   Lab Results  Component Value Date   TIBC 467 (H) 04/06/2020   FERRITIN 12 04/06/2020   IRONPCTSAT 10 (L) 04/06/2020   Lab Results  Component Value Date   LDH 163 04/06/2020     STUDIES:   No current studies  Allergies: No Known Allergies  Current Medications: Current Outpatient Medications  Medication Sig Dispense Refill  . albuterol (VENTOLIN HFA) 108 (90 Base) MCG/ACT inhaler Inhale into the lungs every 6 (six) hours as needed for wheezing or shortness of breath.    Marland Kitchen apixaban (ELIQUIS) 5 MG TABS tablet Take 1 tablet (5 mg total) by mouth 2 (two) times daily. 180 tablet 3  . atenolol (TENORMIN) 50 MG tablet Take 50 mg by mouth daily.    Marland Kitchen atorvastatin (LIPITOR) 20 MG tablet Take 1 tablet by mouth daily.    Marland Kitchen azelastine (ASTELIN) 0.1 % nasal spray Use 2 sprays in each nostril twice  daily as needed 30 mL 2  . furosemide (LASIX) 20 MG tablet Take 20 mg by mouth daily as needed.    . Olopatadine HCl 0.2 % SOLN Place 1 drop into both eyes daily as needed. 2.5 mL 5  . pantoprazole (PROTONIX) 40 MG tablet Take 40 mg by mouth daily.    . sertraline (ZOLOFT) 100 MG tablet Take 150 mg by mouth daily.     . Vitamin D, Ergocalciferol, (DRISDOL) 50000 units CAPS capsule Take 1 capsule by mouth once a week.     No current facility-administered medications for this visit.  ASSESSMENT & PLAN:   Assessment:   1.  Stage I breast cancer diagnosed in August 1999.  She remains without evidence of recurrence.  2.  Anemia, first appreciated in August with a hemoglobin of 9.9 with an MCV of 76.7.  Iron studies were consistent with deficiency.  She has had dark stools, so I advise that she be evaluated by GI.  3.  Intermittent dizziness and fatigue, likely secondary to her anemia.  4.  Former smoker with COPD.  We will continue annual low dose lung cancer screening CT scans.  Plan: I will see her back in 2-3 months with CBC and CMP for repeat evaluation.  She understands and agrees with this plan of care.   I provided 23 minutes of face-to-face time during this this encounter and > 50% was spent counseling as documented under my assessment and plan.    Derwood Kaplan, MD [facility] AT [facility] [address] [place] [address] Dept: [phone] Dept Fax: [phone]   I, Rita Ohara, am acting as scribe for Derwood Kaplan, MD  I have reviewed this report as typed by the medical scribe, and it is complete and accurate.

## 2020-08-04 NOTE — Telephone Encounter (Signed)
08/04/20 Spoke with patient and scheduled all appts

## 2020-08-07 ENCOUNTER — Encounter: Payer: Self-pay | Admitting: Oncology

## 2020-08-07 ENCOUNTER — Inpatient Hospital Stay: Payer: Medicare Other

## 2020-08-07 ENCOUNTER — Inpatient Hospital Stay: Payer: Medicare Other | Admitting: Oncology

## 2020-08-07 NOTE — Progress Notes (Signed)
[facility]  [address] [place],  [place]  [address] [phone]  Clinic Day:  08/09/2020  Referring physician: Jeanie Sewer, NP   This document serves as a record of services personally performed by Hosie Poisson, MD. It was created on their behalf by Curry,Lauren E, a trained medical scribe. The creation of this record is based on the scribe's personal observations and the provider's statements to them.   CHIEF COMPLAINT:  CC: History of stage I breast cancer  Current Treatment:  Surveillance   HISTORY OF PRESENT ILLNESS:  Dana [name] is a 60 y.o. female with a history of stage I breast cancer diagnosed in August 1999.  This was a 1.1 cm invasive ductal carcinoma, treated with lumpectomy, CMF chemotherapy, and radiation.  She did not tolerate tamoxifen, but has never had evidence of recurrence.  We began seeing her in August 2004, when she moved to the area.  She is on yearly follow-up, but was last seen in August of 2016.  We did CT scans in April of 2016, and found no evidence of malignancy, but there was a 6 mm nodule in the posterior right upper lobe on the CT chest.  She was seen in July with CT chest to follow up on the lung nodule, and it had resolved.  Due to her personal history of breast cancer diagnosed at age 49, we have recommended genetic testing with the [facility] Hereditary Cancer Panel testing on July 11th, 2016 with [place] and that result was negative.  She has had a total abdominal hysterectomy for dysfunctional uterine bleeding, but her ovaries are intact.  She states her last colonoscopy was in 2011, and she is due for this again. She had 2 polyps removed at that time, one of which was a tubular adenoma.  She also has atrial fibrillation, history of pneumonia, mild to moderate tricuspid regurgitation, and history of prior stroke in 2014.  I had seen her in October 2017 and we evaluated multiple issues  that she was dealing with.  She is off the Prilosec and now on Protonix 40 mg daily and also on vitamin D2 1.25 mg weekly, in addition to her usual medications.  She had a squamous cell carcinoma removed from her right shoulder, and a squamous cell carcinoma in situ removed from the left anterior chest.  CT imaging from September 2021 revealed stable, benign small pulmonary nodules. No further routine CT follow-up is required for these nodules. Consider ongoing annual low-dose CT lung cancer screening if indicated by patient age, smoking history, and/or other risk factors for lung cancer.   She does have a history of peptic ulcer disease.  She has had some anxiety and depression.  INTERVAL HISTORY:  [name] is here for follow up and states that she has been having pain of the left arm since December.  At times the she is not be able to lift up her arm due to pain.  This may be secondary to a pinched nerve or other spine abnormality.  She may benefit from physical therapy.  The patient called two weeks ago with complaints of neck pain and changes of her nasal passage, describing erythematous masses bilaterally.  These seem benign to me, but have not been evaluated by ENT.  CT head from March 15th revealed no evidence of intracranial metastatic disease or acute intracranial abnormality.  There is a chronic infarct of left basal ganglia and adjacent white matter.  CT neck was  stable with no mass or adenopathy identified.  Blood counts and chemistries are unremarkable except for a mildly elevated SGOT of 51.  She is on atorvastatin.  Iron is 77 with a TIBC of 339 for a percent saturation of 22.7%.  Ferritin is 62.6.  Her  appetite is good, and she has lost 2 pounds since her last visit.  She denies fever, chills or other signs of infection.  She denies nausea, vomiting, bowel issues, or abdominal pain.  She denies sore throat, cough, dyspnea, or chest pain.   REVIEW OF SYSTEMS:  Review of Systems   Constitutional: Negative.  Negative for appetite change, chills, fatigue, fever and unexpected weight change.  HENT:         Erythema and small nodules within the nose  Eyes: Negative.   Respiratory: Negative.  Negative for chest tightness, cough, hemoptysis, shortness of breath and wheezing.   Cardiovascular: Negative.  Negative for chest pain, leg swelling and palpitations.  Gastrointestinal: Negative.  Negative for abdominal distention, abdominal pain, blood in stool, constipation, diarrhea, nausea and vomiting.  Endocrine: Negative.   Genitourinary: Negative.  Negative for difficulty urinating, dysuria, frequency and hematuria.   Musculoskeletal: Negative for arthralgias, back pain, flank pain, gait problem and myalgias.       Pain of the left arm  Skin: Negative.   Neurological: Negative.  Negative for dizziness, extremity weakness, gait problem, headaches, light-headedness, numbness, seizures and speech difficulty.  Hematological: Negative.   Psychiatric/Behavioral: Negative.  Negative for depression and sleep disturbance. The patient is not nervous/anxious.      VITALS:  Blood pressure (!) 161/87, pulse 71, temperature 98.3 F (36.8 C), temperature source Oral, height _0  (1.727 m), weight 179 lb 14.4 oz (81.6 kg), SpO2 96 %.  Wt Readings from Last 3 Encounters:  08/09/20 179 lb 14.4 oz (81.6 kg)  07/27/20 181 lb 9.6 oz (82.4 kg)  04/06/20 181 lb 8 oz (82.3 kg)    Body mass index is 27.35 kg/m.  Performance status (ECOG): 1 - Symptomatic but completely ambulatory  PHYSICAL EXAM:  Physical Exam Constitutional:      General: She is not in acute distress.    Appearance: Normal appearance. She is normal weight.  HENT:     Head: Normocephalic and atraumatic.     Nose:     Comments: Swelling and erythema of the septum on both sides with almost polypoid looking swelling. Eyes:     General: No scleral icterus.    Extraocular Movements: Extraocular movements intact.      Conjunctiva/sclera: Conjunctivae normal.     Pupils: Pupils are equal, round, and reactive to light.  Neck:     Comments: Increased muscle tension of bilateral posterior neck. Cardiovascular:     Rate and Rhythm: Normal rate and regular rhythm.     Pulses: Normal pulses.     Heart sounds: Normal heart sounds. No murmur heard. No friction rub. No gallop.   Pulmonary:     Effort: Pulmonary effort is normal. No respiratory distress.     Breath sounds: Normal breath sounds.  Chest:  Breasts:     Right: Normal.     Left: Normal.      Comments: Well healed scar of the right axilla.  No masses in either breast.  She has a faint scar at 3 o'clock in the right breast. Abdominal:     General: Bowel sounds are normal. There is no distension.     Palpations: Abdomen is soft. There is  no hepatomegaly, splenomegaly or mass.     Tenderness: There is no abdominal tenderness.  Musculoskeletal:        General: Normal range of motion.     Cervical back: Normal range of motion and neck supple.     Right lower leg: No edema.     Left lower leg: No edema.  Lymphadenopathy:     Cervical: No cervical adenopathy.  Skin:    General: Skin is warm and dry.  Neurological:     General: No focal deficit present.     Mental Status: She is alert and oriented to person, place, and time. Mental status is at baseline.  Psychiatric:        Mood and Affect: Mood normal.        Behavior: Behavior normal.        Thought Content: Thought content normal.        Judgment: Judgment normal.     LABS:   CBC Latest Ref Rng & Units 04/06/2020 10/27/2017  WBC 4.0 - 10.5 K/uL 8.1 8.8  Hemoglobin 12.0 - 15.0 g/dL 11.7(L) 12.5  Hematocrit 36.0 - 46.0 % 37.2 37.6  Platelets 150 - 400 K/uL 237 210   No flowsheet data found.   Lab Results  Component Value Date   TIBC 467 (H) 04/06/2020   FERRITIN 12 04/06/2020   IRONPCTSAT 10 (L) 04/06/2020   Lab Results  Component Value Date   LDH 163 04/06/2020      STUDIES:   EXAM: 08/08/2020 CT HEAD WITHOUT AND WITH CONTRAST  TECHNIQUE: Contiguous axial images were obtained from the base of the skull through the vertex without and with intravenous contrast  CONTRAST:  100 mL Isovue 370  COMPARISON:  MRI 2017  FINDINGS: Brain: There is no acute intracranial hemorrhage, mass, mass effect, or edema. No abnormal enhancement. No new loss of gray-white differentiation. Chronic infarct of the left basal ganglia and adjacent white matter. There is no extra-axial fluid collection. Ventricles and sulci are stable in size and configuration.  Vascular: No hyperdense vessel or unexpected calcification.  Skull: Calvarium is unremarkable.  Sinuses/Orbits: No acute finding.  Other: None.  IMPRESSION: No evidence of intracranial metastatic disease. No acute intracranial abnormality. Chronic infarct of left basal ganglia and adjacent white matter.   EXAM: 08/08/2020 CT NECK WITH CONTRAST  TECHNIQUE: Multidetector CT imaging of the neck was performed using the standard protocol following the bolus administration of intravenous contrast.  CONTRAST:  100 mL Isovue 370 IV  COMPARISON:  CT neck 02/17/2019  FINDINGS: Pharynx and larynx: Normal. No mass or swelling.  Salivary glands: Punctate calcification right parotid gland unchanged. No parotid mass or inflammation. Submandibular glands normal bilaterally.  Thyroid: Negative  Lymph nodes: No enlarged lymph nodes in the neck.  Vascular: Normal vascular enhancement.  Limited intracranial: Negative  Visualized orbits: Negative  Mastoids and visualized paranasal sinuses: Negative  Skeleton: Disc degeneration and spurring C5-6 and C6-7. No acute skeletal abnormality.  Upper chest: Mild apical scarring bilaterally. Mild apical emphysema. No lung nodule identified.  Other: None  IMPRESSION: Stable CT neck.  No mass or adenopathy identified.   Allergies: No Known  Allergies  Current Medications: Current Outpatient Medications  Medication Sig Dispense Refill  . albuterol (VENTOLIN HFA) 108 (90 Base) MCG/ACT inhaler Inhale into the lungs every 6 (six) hours as needed for wheezing or shortness of breath.    Marland Kitchen apixaban (ELIQUIS) 5 MG TABS tablet Take 1 tablet (5 mg total) by mouth 2 (two)  times daily. 180 tablet 3  . atenolol (TENORMIN) 50 MG tablet Take 50 mg by mouth daily.    Marland Kitchen atorvastatin (LIPITOR) 20 MG tablet Take 1 tablet by mouth daily.    Marland Kitchen azelastine (ASTELIN) 0.1 % nasal spray Use 2 sprays in each nostril twice daily as needed 30 mL 2  . furosemide (LASIX) 20 MG tablet Take 20 mg by mouth daily as needed.    . Olopatadine HCl 0.2 % SOLN Place 1 drop into both eyes daily as needed. 2.5 mL 5  . pantoprazole (PROTONIX) 40 MG tablet Take 40 mg by mouth daily.    . sertraline (ZOLOFT) 100 MG tablet Take 150 mg by mouth daily.     . Vitamin D, Ergocalciferol, (DRISDOL) 50000 units CAPS capsule Take 1 capsule by mouth once a week.     No current facility-administered medications for this visit.     ASSESSMENT & PLAN:   Assessment:   1.  Stage I breast cancer diagnosed in August 1999.  She remains without evidence of recurrence.  2.  Anemia, first appreciated in August 2021.  Iron studies were consistent with deficiency with a saturation of 14% at that time.  Currently her anemia has resolved, and iron studies are within a good range.  3.  Former smoker with COPD.  We will continue annual low dose lung cancer screening CT scans.  4.  Left arm pain for the past 3-4 months.  This seems musculoskeletal in nature, and likely related to her degenerative disc disease of the cervical spine.    5.  Two tiny nodules of the bilateral sinuses.  This may represent possible staph infection, and so I will place her on Bactroban.  If this does not clear, then we can proceed with ENT referral.    Plan: She does have two polypoid areas of swelling of  bilateral nasal canals, which have bothered her.  These seem benign to me, and may represent possible staph infection.  I will place her on Bactroban to see if this clears, if not, we will refer her to ENT.  Otherwise, I will see her back in 6 months with CBC and CMP for repeat evaluation.  She understands and agrees with this plan of care.   I provided 20 minutes of face-to-face time during this this encounter and > 50% was spent counseling as documented under my assessment and plan.    Derwood Kaplan, MD [facility] AT [facility] [address] [place] [address] Dept: [phone] Dept Fax: [phone]   I, Rita Ohara, am acting as scribe for Derwood Kaplan, MD  I have reviewed this report as typed by the medical scribe, and it is complete and accurate.

## 2020-08-08 ENCOUNTER — Encounter: Payer: Self-pay | Admitting: Oncology

## 2020-08-09 ENCOUNTER — Encounter: Payer: Self-pay | Admitting: Oncology

## 2020-08-09 ENCOUNTER — Other Ambulatory Visit: Payer: Self-pay

## 2020-08-09 ENCOUNTER — Telehealth: Payer: Self-pay | Admitting: Oncology

## 2020-08-09 ENCOUNTER — Inpatient Hospital Stay: Payer: Medicare Other | Attending: Oncology | Admitting: Oncology

## 2020-08-09 ENCOUNTER — Other Ambulatory Visit: Payer: Self-pay | Admitting: Oncology

## 2020-08-09 VITALS — BP 161/87 | HR 71 | Temp 98.3°F | Ht 68.0 in | Wt 179.9 lb

## 2020-08-09 DIAGNOSIS — Z86010 Personal history of colonic polyps: Secondary | ICD-10-CM

## 2020-08-09 DIAGNOSIS — I890 Lymphedema, not elsewhere classified: Secondary | ICD-10-CM

## 2020-08-09 DIAGNOSIS — R918 Other nonspecific abnormal finding of lung field: Secondary | ICD-10-CM

## 2020-08-09 DIAGNOSIS — R6889 Other general symptoms and signs: Secondary | ICD-10-CM

## 2020-08-09 DIAGNOSIS — C50111 Malignant neoplasm of central portion of right female breast: Secondary | ICD-10-CM

## 2020-08-09 DIAGNOSIS — Z171 Estrogen receptor negative status [ER-]: Secondary | ICD-10-CM

## 2020-08-09 HISTORY — DX: Lymphedema, not elsewhere classified: I89.0

## 2020-08-09 MED ORDER — BACTROBAN NASAL 2 % NA OINT: 1.0000 "application " | TOPICAL_OINTMENT | Freq: Two times a day (BID) | NASAL | 5 refills | Status: DC

## 2020-08-09 NOTE — Telephone Encounter (Signed)
Per 3/16 LOS, patient scheduled for Sept Appts (will be scheduling Low Dose CT Lung Screening before Appt).  Gave patient Appt Summary

## 2020-08-14 ENCOUNTER — Other Ambulatory Visit: Payer: Self-pay | Admitting: Allergy

## 2020-08-15 DIAGNOSIS — J449 Chronic obstructive pulmonary disease, unspecified: Secondary | ICD-10-CM | POA: Insufficient documentation

## 2020-08-15 DIAGNOSIS — C50919 Malignant neoplasm of unspecified site of unspecified female breast: Secondary | ICD-10-CM | POA: Insufficient documentation

## 2020-08-15 DIAGNOSIS — J45909 Unspecified asthma, uncomplicated: Secondary | ICD-10-CM | POA: Insufficient documentation

## 2020-08-16 ENCOUNTER — Encounter: Payer: Self-pay | Admitting: Cardiology

## 2020-08-16 ENCOUNTER — Other Ambulatory Visit: Payer: Self-pay

## 2020-08-16 ENCOUNTER — Ambulatory Visit (INDEPENDENT_AMBULATORY_CARE_PROVIDER_SITE_OTHER): Payer: Medicare Other | Admitting: Cardiology

## 2020-08-16 VITALS — BP 132/80 | HR 78 | Ht 68.0 in | Wt 171.0 lb

## 2020-08-16 DIAGNOSIS — Z171 Estrogen receptor negative status [ER-]: Secondary | ICD-10-CM

## 2020-08-16 DIAGNOSIS — C50111 Malignant neoplasm of central portion of right female breast: Secondary | ICD-10-CM

## 2020-08-16 DIAGNOSIS — R072 Precordial pain: Secondary | ICD-10-CM

## 2020-08-16 DIAGNOSIS — I480 Paroxysmal atrial fibrillation: Secondary | ICD-10-CM | POA: Diagnosis not present

## 2020-08-16 DIAGNOSIS — I6990 Unspecified sequelae of unspecified cerebrovascular disease: Secondary | ICD-10-CM

## 2020-08-16 DIAGNOSIS — G4733 Obstructive sleep apnea (adult) (pediatric): Secondary | ICD-10-CM

## 2020-08-16 HISTORY — DX: Precordial pain: R07.2

## 2020-08-16 MED ORDER — DILTIAZEM HCL 30 MG PO TABS: 30.0000 mg | ORAL_TABLET | Freq: Four times a day (QID) | ORAL | 1 refills | Status: AC | PRN

## 2020-08-16 MED ORDER — METOPROLOL TARTRATE 100 MG PO TABS: 100.0000 mg | ORAL_TABLET | Freq: Once | ORAL | 0 refills | Status: DC

## 2020-08-16 NOTE — Progress Notes (Signed)
Cardiology Office Note:    Date:  08/16/2020   ID:  CORTLYN CANNELL, DOB [birth year], MRN [id number]  PCP:  Jeanie Sewer, NP  Cardiologist:  Jenne Campus, MD    Referring MD: Jeanie Sewer, NP   Chief Complaint  Patient presents with  . Medication Management  . Chest Pain  . Cough    History of Present Illness:    Dana [name] is a 60 y.o. female with past medical history significant for paroxysmal atrial fibrillation.  She is anticoagulated Eliquis secondary to the fact she also had a history of stroke.  I seen her last time in 2019.  She requested to be seen because she still having episode of palpitations.  She said within the last 6 months associated describe 3 episodes of palpitations one time she decided to go to the emergency room when she arrived there she converted to sinus rhythm.  She continue anticoagulation.  Also worse in the future is the fact when she was having palpitation she also felt some tightness pressure in the chest going towards her jaw.  It does not happen when she does activity of daily living or does normal things at home.  She does not exercise on the regular basis.  She does have history of breast CA, also essential hypertension.  Past Medical History:  Diagnosis Date  . Asthma   . Atypical chest pain 03/25/2016  . Breast cancer ([place])   . COPD (chronic obstructive pulmonary disease) ([place])   . Dizziness 08/18/2017  . Hx of adenomatous polyp of colon 08/09/2009  . Hyperthyroidism 01/04/2016  . Late effects of CVA (cerebrovascular accident) 03/25/2016  . Lymphedema of right upper extremity 08/09/2020  . Malignant neoplasm of central portion of breast in female, estrogen receptor negative ([place]) 03/25/2016  . Obstructive sleep apnea syndrome 03/25/2016  . Paroxysmal atrial fibrillation ([place]) 03/25/2016    Past Surgical History:  Procedure Laterality Date  . ABDOMINAL HYSTERECTOMY    . APPENDECTOMY    . BREAST BIOPSY    . lymph node  removal    . PORTACATH PLACEMENT    . portacath removal    . TONSILLECTOMY      Current Medications: Current Meds  Medication Sig  . albuterol (VENTOLIN HFA) 108 (90 Base) MCG/ACT inhaler Inhale 2 puffs into the lungs every 6 (six) hours as needed for wheezing or shortness of breath.  Marland Kitchen apixaban (ELIQUIS) 5 MG TABS tablet Take 1 tablet (5 mg total) by mouth 2 (two) times daily.  Marland Kitchen atorvastatin (LIPITOR) 20 MG tablet Take 1 tablet by mouth daily.  . Azelastine HCl 137 MCG/SPRAY SOLN Use 2 sprays in each nostril twice daily as needed (Patient taking differently: Place 2 sprays into both nostrils as needed (rhinitis).)  . diltiazem (CARDIZEM) 30 MG tablet Take 1 tablet (30 mg total) by mouth every 6 (six) hours as needed (palpitations).  . furosemide (LASIX) 20 MG tablet Take 20 mg by mouth daily as needed for fluid.  . metoprolol tartrate (LOPRESSOR) 100 MG tablet Take 1 tablet (100 mg total) by mouth once for 1 dose. 2 hours before ct  . Olopatadine HCl 0.2 % SOLN Place 1 drop into both eyes daily as needed. (Patient taking differently: Place 1 drop into both eyes daily as needed (itching).)  . pantoprazole (PROTONIX) 40 MG tablet Take 40 mg by mouth daily.  . sertraline (ZOLOFT) 100 MG tablet Take 150 mg by mouth daily.   . Vitamin D, Ergocalciferol, (DRISDOL) 50000 units  CAPS capsule Take 1 capsule by mouth once a week.     Allergies:   Patient has no known allergies.   Social History   Socioeconomic History  . Marital status: Married    Spouse name: Not on file  . Number of children: Not on file  . Years of education: Not on file  . Highest education level: Not on file  Occupational History  . Not on file  Tobacco Use  . Smoking status: Former Smoker    Types: Cigarettes    Quit date: 02/24/2013    Years since quitting: 7.4  . Smokeless tobacco: Never Used  Vaping Use  . Vaping Use: Never used  Substance and Sexual Activity  . Alcohol use: Yes    Comment: Once a year  .  Drug use: No  . Sexual activity: Not on file  Other Topics Concern  . Not on file  Social History Narrative  . Not on file   Social Determinants of Health   Financial Resource Strain: Not on file  Food Insecurity: Not on file  Transportation Needs: Not on file  Physical Activity: Not on file  Stress: Not on file  Social Connections: Not on file     Family History: The patient's family history includes Hypertension in her maternal grandmother and mother; Lung cancer in her maternal uncle; Ovarian cancer in her paternal aunt and paternal grandfather. ROS:   Please see the history of present illness.    All 14 point review of systems negative except as described per history of present illness  EKGs/Labs/Other Studies Reviewed:      Recent Labs: 04/06/2020: Hemoglobin 11.7; Platelets 237  Recent Lipid Panel No results found for: CHOL, TRIG, HDL, CHOLHDL, VLDL, LDLCALC, LDLDIRECT  Physical Exam:    VS:  BP 132/80 (BP Location: Left Arm, Patient Position: Sitting)   Pulse 78   Ht 5\' 8"  (1.727 m)   Wt 171 lb (77.6 kg)   SpO2 95%   BMI 26.00 kg/m     Wt Readings from Last 3 Encounters:  08/16/20 171 lb (77.6 kg)  08/09/20 179 lb 14.4 oz (81.6 kg)  07/27/20 181 lb 9.6 oz (82.4 kg)     GEN:  Well nourished, well developed in no acute distress HEENT: Normal NECK: No JVD; No carotid bruits LYMPHATICS: No lymphadenopathy CARDIAC: RRR, no murmurs, no rubs, no gallops RESPIRATORY:  Clear to auscultation without rales, wheezing or rhonchi  ABDOMEN: Soft, non-tender, non-distended MUSCULOSKELETAL:  No edema; No deformity  SKIN: Warm and dry LOWER EXTREMITIES: no swelling NEUROLOGIC:  Alert and oriented x 3 PSYCHIATRIC:  Normal affect   ASSESSMENT:    1. Paroxysmal atrial fibrillation (HCC)   2. Precordial chest pain   3. Late effects of CVA (cerebrovascular accident)   4. Malignant neoplasm of central portion of right breast in female, estrogen receptor negative ([place])    5. Obstructive sleep apnea syndrome    PLAN:    In order of problems listed above:  1. Paroxysmal atrial fibrillation.  Chads 2 vascular equals 3 continue anticoagulation.  I will ask her to have an echocardiogram done to assess left ventricle ejection fraction as well as atrial size.  She will also be scheduled to have coronary CT angio that is because of chest pain that she got well having episode of atrial fibrillation.  She does not exercise much during the day therefore she probably never reached the point of ischemia if she truly got ischemic heart disease. 2.  Late effect of CVA, stable. 3. Precordial chest pain against coronary CT angio will be done. 4. Malignant breast cancer being follow-up excellently by our oncology team.   Medication Adjustments/Labs and Tests Ordered: Current medicines are reviewed at length with the patient today.  Concerns regarding medicines are outlined above.  Orders Placed This Encounter  Procedures  . CT CORONARY MORPH W/CTA COR W/SCORE W/CA W/CM &/OR WO/CM  . Basic metabolic panel  . EKG 12-Lead  . ECHOCARDIOGRAM COMPLETE   Medication changes:  Meds ordered this encounter  Medications  . diltiazem (CARDIZEM) 30 MG tablet    Sig: Take 1 tablet (30 mg total) by mouth every 6 (six) hours as needed (palpitations).    Dispense:  30 tablet    Refill:  1  . metoprolol tartrate (LOPRESSOR) 100 MG tablet    Sig: Take 1 tablet (100 mg total) by mouth once for 1 dose. 2 hours before ct    Dispense:  1 tablet    Refill:  0    Signed, Park Liter, MD, [facility] 08/16/2020 4:20 PM    [facility]

## 2020-08-16 NOTE — Patient Instructions (Signed)
Medication Instructions:  Your physician has recommended you make the following change in your medication:  TAKE AS NEEDED FOR PALPITATIONS: Cardizem 30 mg every6 hours as needed for palpitations.  *If you need a refill on your cardiac medications before your next appointment, please call your pharmacy*   Lab Work: Your physician recommends that you return for lab work 3-7 days before ct: BMP   If you have labs (blood work) drawn today and your tests are completely normal, you will receive your results only by: Marland Kitchen MyChart Message (if you have MyChart) OR . A paper copy in the mail If you have any lab test that is abnormal or we need to change your treatment, we will call you to review the results.   Testing/Procedures: Your cardiac CT will be scheduled at one of the below locations:   [facility] [address] [place], [place] [address] [phone]  [facility] [address] [place],  [address] [phone]  If scheduled at [facility], please arrive at the [facility] main entrance (entrance A) of [facility] 30 minutes prior to test start time. Proceed to the [facility] Radiology Department (first floor) to check-in and test prep.  If scheduled at [facility], please arrive 15 mins early for check-in and test prep.  Please follow these instructions carefully (unless otherwise directed):  Hold all erectile dysfunction medications at least 3 days (72 hrs) prior to test.  On the Night Before the Test: . Be sure to Drink plenty of water. . Do not consume any caffeinated/decaffeinated beverages or chocolate 12 hours prior to your test. . Do not take any antihistamines 12 hours prior to your test. .  On the Day of the Test: . Drink plenty of water until 1 hour prior to the test. . Do not eat any food 4 hours prior to the test. . You may take your regular  medications prior to the test.  . Take metoprolol (Lopressor) two hours prior to test. . HOLD Furosemide morning of the test. . FEMALES- please wear underwire-free bra if available         After the Test: . Drink plenty of water. . After receiving IV contrast, you may experience a mild flushed feeling. This is normal. . On occasion, you may experience a mild rash up to 24 hours after the test. This is not dangerous. If this occurs, you can take Benadryl 25 mg and increase your fluid intake. . If you experience trouble breathing, this can be serious. If it is severe call 911 IMMEDIATELY. If it is mild, please call our office. . If you take any of these medications: Glipizide/Metformin, Avandament, Glucavance, please do not take 48 hours after completing test unless otherwise instructed.   Once we have confirmed authorization from your insurance company, we will call you to set up a date and time for your test. Based on how quickly your insurance processes prior authorizations requests, please allow up to 4 weeks to be contacted for scheduling your Cardiac CT appointment. Be advised that routine Cardiac CT appointments could be scheduled as many as 8 weeks after your provider has ordered it.  For non-scheduling related questions, please contact the cardiac imaging nurse navigator should you have any questions/concerns: Marchia Bond, Cardiac Imaging Nurse Navigator Gordy Clement, Cardiac Imaging Nurse Navigator [facility] Direct Office Dial: [phone]   For scheduling needs, including cancellations  and rescheduling, please call [place], [phone].   Your physician has requested that you have an echocardiogram. Echocardiography is a painless test that uses sound waves to create images of your heart. It provides your doctor with information about the size and shape of your heart and how well your heart's chambers and valves are working. This procedure takes  approximately one hour. There are no restrictions for this procedure.   Follow-Up: At [facility], you and your health needs are our priority.  As part of our continuing mission to provide you with exceptional heart care, we have created designated Provider Care Teams.  These Care Teams include your primary Cardiologist (physician) and Advanced Practice Providers (APPs -  Physician Assistants and Nurse Practitioners) who all work together to provide you with the care you need, when you need it.  We recommend signing up for the patient portal called "MyChart".  Sign up information is provided on this After Visit Summary.  MyChart is used to connect with patients for Virtual Visits (Telemedicine).  Patients are able to view lab/test results, encounter notes, upcoming appointments, etc.  Non-urgent messages can be sent to your provider as well.   To learn more about what you can do with MyChart, go to [email or web address].    Your next appointment:   2 month(s)  The format for your next appointment:   In Person  Provider:   Jenne Campus, MD   Other Instructions  Diltiazem Tablets What is this medicine? DILTIAZEM (dil TYE a zem) is a calcium channel blocker. It relaxes your blood vessels and decreases the amount of work the heart has to do. It treats and/or prevents chest pain (also called angina). This medicine may be used for other purposes; ask your health care provider or pharmacist if you have questions. COMMON BRAND NAME(S): Cardizem What should I tell my health care provider before I take this medicine? They need to know if you have any of these conditions:  heart attack  heart disease  irregular heartbeat or rhythm  low blood pressure  an unusual or allergic reaction to diltiazem, other drugs, foods, dyes, or preservatives  pregnant or trying to get pregnant  breast-feeding How should I use this medicine? Take this drug by mouth. Take it as directed on the  prescription label at the same time every day. Keep taking it unless your health care provider tells you to stop. Talk to your health care provider about the use of this drug in children. Special care may be needed. Overdosage: If you think you have taken too much of this medicine contact a poison control center or emergency room at once. NOTE: This medicine is only for you. Do not share this medicine with others. What if I miss a dose? If you miss a dose, take it as soon as you can. If it is almost time for your next dose, take only that dose. Do not take double or extra doses. What may interact with this medicine? Do not take this medicine with any of the following:  cisapride  hawthorn  pimozide  ranolazine  red yeast rice This medicine may also interact with the following medications:  buspirone  carbamazepine  cimetidine  cyclosporine  digoxin  local anesthetics or general anesthetics  lovastatin  medicines for anxiety or difficulty sleeping like midazolam and triazolam  medicines for high blood pressure or heart problems  quinidine  rifampin, rifabutin, or rifapentine This list may not describe all possible interactions. Give your health care provider  a list of all the medicines, herbs, non-prescription drugs, or dietary supplements you use. Also tell them if you smoke, drink alcohol, or use illegal drugs. Some items may interact with your medicine. What should I watch for while using this medicine? Visit your care team for regular checks on your progress. Check your blood pressure as directed. Ask your care team what your blood pressure should be. Also, find out when you should contact them. Do not treat yourself for coughs, colds, or pain while you are using this medication without asking your care team for advice. Some medications may increase your blood pressure. This medication may cause serious skin reactions. They can happen weeks to months after starting the  medication. Contact your care team right away if you notice fevers or flu-like symptoms with a rash. The rash may be red or purple and then turn into blisters or peeling of the skin. Or, you might notice a red rash with swelling of the face, lips or lymph nodes in your neck or under your arms. You may get drowsy or dizzy. Do not drive, use machinery, or do anything that needs mental alertness until you know how this medication affects you. Do not stand up or sit up quickly, especially if you are an older patient. This reduces the risk of dizzy or fainting spells. What side effects may I notice from receiving this medicine? Side effects that you should report to your doctor or health care provider as soon as possible:  allergic reactions (skin rash, itching or hives; swelling of the face, lips, or tongue)  heart failure (trouble breathing; fast, irregular heartbeat; sudden weight gain; swelling of the ankles, feet, hands; unusually weak or tired)  heartbeat rhythm changes (trouble breathing; chest pain; dizziness; fast, irregular heartbeat; feeling faint or lightheaded, falls)  liver injury (dark yellow or brown urine; general ill feeling or flu-like symptoms; loss of appetite, right upper belly pain; unusually weak or tired, yellowing of the eyes or skin)  low blood pressure (dizziness; feeling faint or lightheaded, falls; unusually weak or tired)  redness, blistering, peeling, or loosening of the skin, including inside the mouth Side effects that usually do not require medical attention (report to your doctor or health care provider if they continue or are bothersome):  changes in sex drive or performance  depressed mood  headache  sudden weight gain  nausea  trouble sleeping This list may not describe all possible side effects. Call your doctor for medical advice about side effects. You may report side effects to FDA at 1-800-FDA-1088. Where should I keep my medicine? Keep out of the  reach of children and pets. Store at room temperature between 15 and 30 degrees C (59 and 86 degrees F). Protect from moisture. Keep the container tightly closed. Throw away any unused drug after the expiration date. NOTE: This sheet is a summary. It may not cover all possible information. If you have questions about this medicine, talk to your doctor, pharmacist, or health care provider.  2021 Elsevier/Gold Standard (2020-03-30 15:46:34)   Echocardiogram An echocardiogram is a test that uses sound waves (ultrasound) to produce images of the heart. Images from an echocardiogram can provide important information about:  Heart size and shape.  The size and thickness and movement of your heart's walls.  Heart muscle function and strength.  Heart valve function or if you have stenosis. Stenosis is when the heart valves are too narrow.  If blood is flowing backward through the heart valves (regurgitation).  A tumor or infectious growth around the heart valves.  Areas of heart muscle that are not working well because of poor blood flow or injury from a heart attack.  Aneurysm detection. An aneurysm is a weak or damaged part of an artery wall. The wall bulges out from the normal force of blood pumping through the body. Tell a health care provider about:  Any allergies you have.  All medicines you are taking, including vitamins, herbs, eye drops, creams, and over-the-counter medicines.  Any blood disorders you have.  Any surgeries you have had.  Any medical conditions you have.  Whether you are pregnant or may be pregnant. What are the risks? Generally, this is a safe test. However, problems may occur, including an allergic reaction to dye (contrast) that may be used during the test. What happens before the test? No specific preparation is needed. You may eat and drink normally. What happens during the test?  You will take off your clothes from the waist up and put on a hospital  gown.  Electrodes or electrocardiogram (ECG)patches may be placed on your chest. The electrodes or patches are then connected to a device that monitors your heart rate and rhythm.  You will lie down on a table for an ultrasound exam. A gel will be applied to your chest to help sound waves pass through your skin.  A handheld device, called a transducer, will be pressed against your chest and moved over your heart. The transducer produces sound waves that travel to your heart and bounce back (or "echo" back) to the transducer. These sound waves will be captured in real-time and changed into images of your heart that can be viewed on a video monitor. The images will be recorded on a computer and reviewed by your health care provider.  You may be asked to change positions or hold your breath for a short time. This makes it easier to get different views or better views of your heart.  In some cases, you may receive contrast through an IV in one of your veins. This can improve the quality of the pictures from your heart. The procedure may vary among health care providers and hospitals.   What can I expect after the test? You may return to your normal, everyday life, including diet, activities, and medicines, unless your health care provider tells you not to do that. Follow these instructions at home:  It is up to you to get the results of your test. Ask your health care provider, or the department that is doing the test, when your results will be ready.  Keep all follow-up visits. This is important. Summary  An echocardiogram is a test that uses sound waves (ultrasound) to produce images of the heart.  Images from an echocardiogram can provide important information about the size and shape of your heart, heart muscle function, heart valve function, and other possible heart problems.  You do not need to do anything to prepare before this test. You may eat and drink normally.  After the  echocardiogram is completed, you may return to your normal, everyday life, unless your health care provider tells you not to do that. This information is not intended to replace advice given to you by your health care provider. Make sure you discuss any questions you have with your health care provider. Document Revised: 01/04/2020 Document Reviewed: 01/04/2020 Elsevier Patient Education  [place].   Cardiac CT Angiogram A cardiac CT angiogram is a procedure  to look at the heart and the area around the heart. It may be done to help find the cause of chest pains or other symptoms of heart disease. During this procedure, a substance called contrast dye is injected into the blood vessels in the area to be checked. A large X-ray machine, called a CT scanner, then takes detailed pictures of the heart and the surrounding area. The procedure is also sometimes called a coronary CT angiogram, coronary artery scanning, or CTA. A cardiac CT angiogram allows the health care provider to see how well blood is flowing to and from the heart. The health care provider will be able to see if there are any problems, such as:  Blockage or narrowing of the coronary arteries in the heart.  Fluid around the heart.  Signs of weakness or disease in the muscles, valves, and tissues of the heart. Tell a health care provider about:  Any allergies you have. This is especially important if you have had a previous allergic reaction to contrast dye.  All medicines you are taking, including vitamins, herbs, eye drops, creams, and over-the-counter medicines.  Any blood disorders you have.  Any surgeries you have had.  Any medical conditions you have.  Whether you are pregnant or may be pregnant.  Any anxiety disorders, chronic pain, or other conditions you have that may increase your stress or prevent you from lying still. What are the risks? Generally, this is a safe procedure. However, problems may occur,  including:  Bleeding.  Infection.  Allergic reactions to medicines or dyes.  Damage to other structures or organs.  Kidney damage from the contrast dye that is used.  Increased risk of cancer from radiation exposure. This risk is low. Talk with your health care provider about: ? The risks and benefits of testing. ? How you can receive the lowest dose of radiation. What happens before the procedure?  Wear comfortable clothing and remove any jewelry, glasses, dentures, and hearing aids.  Follow instructions from your health care provider about eating and drinking. This may include: ? For 12 hours before the procedure -- avoid caffeine. This includes tea, coffee, soda, energy drinks, and diet pills. Drink plenty of water or other fluids that do not have caffeine in them. Being well hydrated can prevent complications. ? For 4-6 hours before the procedure -- stop eating and drinking. The contrast dye can cause nausea, but this is less likely if your stomach is empty.  Ask your health care provider about changing or stopping your regular medicines. This is especially important if you are taking diabetes medicines, blood thinners, or medicines to treat problems with erections (erectile dysfunction). What happens during the procedure?  Hair on your chest may need to be removed so that small sticky patches called electrodes can be placed on your chest. These will transmit information that helps to monitor your heart during the procedure.  An IV will be inserted into one of your veins.  You might be given a medicine to control your heart rate during the procedure. This will help to ensure that good images are obtained.  You will be asked to lie on an exam table. This table will slide in and out of the CT machine during the procedure.  Contrast dye will be injected into the IV. You might feel warm, or you may get a metallic taste in your mouth.  You will be given a medicine called  nitroglycerin. This will relax or dilate the arteries in your  heart.  The table that you are lying on will move into the CT machine tunnel for the scan.  The person running the machine will give you instructions while the scans are being done. You may be asked to: ? Keep your arms above your head. ? Hold your breath. ? Stay very still, even if the table is moving.  When the scanning is complete, you will be moved out of the machine.  The IV will be removed. The procedure may vary among health care providers and hospitals.   What can I expect after the procedure? After your procedure, it is common to have:  A metallic taste in your mouth from the contrast dye.  A feeling of warmth.  A headache from the nitroglycerin. Follow these instructions at home:  Take over-the-counter and prescription medicines only as told by your health care provider.  If you are told, drink enough fluid to keep your urine pale yellow. This will help to flush the contrast dye out of your body.  Most people can return to their normal activities right after the procedure. Ask your health care provider what activities are safe for you.  It is up to you to get the results of your procedure. Ask your health care provider, or the department that is doing the procedure, when your results will be ready.  Keep all follow-up visits as told by your health care provider. This is important. Contact a health care provider if:  You have any symptoms of allergy to the contrast dye. These include: ? Shortness of breath. ? Rash or hives. ? A racing heartbeat. Summary  A cardiac CT angiogram is a procedure to look at the heart and the area around the heart. It may be done to help find the cause of chest pains or other symptoms of heart disease.  During this procedure, a large X-ray machine, called a CT scanner, takes detailed pictures of the heart and the surrounding area after a contrast dye has been injected into  blood vessels in the area.  Ask your health care provider about changing or stopping your regular medicines before the procedure. This is especially important if you are taking diabetes medicines, blood thinners, or medicines to treat erectile dysfunction.  If you are told, drink enough fluid to keep your urine pale yellow. This will help to flush the contrast dye out of your body. This information is not intended to replace advice given to you by your health care provider. Make sure you discuss any questions you have with your health care provider. Document Revised: 01/06/2019 Document Reviewed: 01/06/2019 Elsevier Patient Education  2021 [employer].

## 2020-08-21 ENCOUNTER — Telehealth: Payer: Self-pay

## 2020-08-21 NOTE — Telephone Encounter (Signed)
-----   Message from Derwood Kaplan, MD sent at 08/18/2020  6:05 PM EDT ----- Regarding: RE: Referral to ENT Yes, to Dr. Gaylyn Cheers, these look like polypoid type lesions ----- Message ----- From: Belva Chimes, LPN Sent: 06/28/3341   4:45 PM EDT To: Derwood Kaplan, MD Subject: Referral to ENT                                Patient called stating she is ready for you to send her the ENT for the spots on the inside of nose.

## 2020-08-21 NOTE — Telephone Encounter (Signed)
Faxed referral to Dr. Lynann Beaver office

## 2020-09-07 ENCOUNTER — Other Ambulatory Visit: Payer: Self-pay

## 2020-09-07 ENCOUNTER — Ambulatory Visit (INDEPENDENT_AMBULATORY_CARE_PROVIDER_SITE_OTHER): Payer: Medicare Other

## 2020-09-07 DIAGNOSIS — R072 Precordial pain: Secondary | ICD-10-CM | POA: Diagnosis not present

## 2020-09-07 DIAGNOSIS — I6990 Unspecified sequelae of unspecified cerebrovascular disease: Secondary | ICD-10-CM

## 2020-09-07 DIAGNOSIS — Z171 Estrogen receptor negative status [ER-]: Secondary | ICD-10-CM

## 2020-09-07 DIAGNOSIS — I480 Paroxysmal atrial fibrillation: Secondary | ICD-10-CM

## 2020-09-07 DIAGNOSIS — C50111 Malignant neoplasm of central portion of right female breast: Secondary | ICD-10-CM | POA: Diagnosis not present

## 2020-09-07 LAB — ECHOCARDIOGRAM COMPLETE
Area-P 1/2: 2.97 cm2
S' Lateral: 3.1 cm

## 2020-09-07 NOTE — Progress Notes (Signed)
Complete echocardiogram performed.  Jimmy Kaye Mitro RDCS, RVT  

## 2020-11-23 ENCOUNTER — Other Ambulatory Visit: Payer: Self-pay

## 2020-11-24 ENCOUNTER — Ambulatory Visit (INDEPENDENT_AMBULATORY_CARE_PROVIDER_SITE_OTHER): Payer: Medicare Other | Admitting: Cardiology

## 2020-11-24 ENCOUNTER — Encounter: Payer: Self-pay | Admitting: Cardiology

## 2020-11-24 ENCOUNTER — Other Ambulatory Visit: Payer: Self-pay

## 2020-11-24 VITALS — BP 140/64 | HR 54 | Ht 68.5 in | Wt 175.8 lb

## 2020-11-24 DIAGNOSIS — G4733 Obstructive sleep apnea (adult) (pediatric): Secondary | ICD-10-CM

## 2020-11-24 DIAGNOSIS — I6930 Unspecified sequelae of cerebral infarction: Secondary | ICD-10-CM

## 2020-11-24 DIAGNOSIS — I480 Paroxysmal atrial fibrillation: Secondary | ICD-10-CM | POA: Diagnosis not present

## 2020-11-24 DIAGNOSIS — I6990 Unspecified sequelae of unspecified cerebrovascular disease: Secondary | ICD-10-CM | POA: Diagnosis not present

## 2020-11-24 DIAGNOSIS — R0789 Other chest pain: Secondary | ICD-10-CM | POA: Diagnosis not present

## 2020-11-24 NOTE — Patient Instructions (Addendum)
Medication Instructions:  Your physician recommends that you continue on your current medications as directed. Please refer to the Current Medication list given to you today.   *If you need a refill on your cardiac medications before your next appointment, please call your pharmacy*   Lab Work: Your physician recommends that you return for lab work 3-7 days before ct: BMP, CBC If you have labs (blood work) drawn today and your tests are completely normal, you will receive your results only by: [place] (if you have MyChart) OR A paper copy in the mail If you have any lab test that is abnormal or we need to change your treatment, we will call you to review the results.   Testing/Procedures:   Your cardiac CT will be scheduled at one of the below locations:   [facility] [address] [place], [place] [address] [phone]  [facility] [address] [place], [place] [address] [phone]  If scheduled at [facility], please arrive at the [facility] main entrance (entrance A) of [facility] 30 minutes prior to test start time. Proceed to the [facility] Radiology Department (first floor) to check-in and test prep.  If scheduled at [facility], please arrive 15 mins early for check-in and test prep.  Please follow these instructions carefully (unless otherwise directed):   On the Night Before the Test: Be sure to Drink plenty of water. Do not consume any caffeinated/decaffeinated beverages or chocolate 12 hours prior to your test. Do not take any antihistamines 12 hours prior to your test.  On the Day of the Test: Drink plenty of water until 1 hour prior to the test. Do not eat any food 4 hours prior to the test. You may take your regular medications prior to the test.  Take metoprolol (Lopressor) two hours prior to test. HOLD Furosemide morning of the  test. FEMALES- please wear underwire-free bra if available         After the Test: Drink plenty of water. After receiving IV contrast, you may experience a mild flushed feeling. This is normal. On occasion, you may experience a mild rash up to 24 hours after the test. This is not dangerous. If this occurs, you can take Benadryl 25 mg and increase your fluid intake. If you experience trouble breathing, this can be serious. If it is severe call 911 IMMEDIATELY. If it is mild, please call our office. If you take any of these medications: Glipizide/Metformin, Avandament, Glucavance, please do not take 48 hours after completing test unless otherwise instructed.   Once we have confirmed authorization from your insurance company, we will call you to set up a date and time for your test. Based on how quickly your insurance processes prior authorizations requests, please allow up to 4 weeks to be contacted for scheduling your Cardiac CT appointment. Be advised that routine Cardiac CT appointments could be scheduled as many as 8 weeks after your provider has ordered it.  For non-scheduling related questions, please contact the cardiac imaging nurse navigator should you have any questions/concerns: Marchia Bond, Cardiac Imaging Nurse Navigator Gordy Clement, Cardiac Imaging Nurse Navigator [facility] Direct Office Dial: [phone]   For scheduling needs, including cancellations and rescheduling, please call [place], [phone].    Follow-Up: At [facility], you and your health needs are our priority.  As part of our continuing mission to provide you with exceptional  heart care, we have created designated Provider Care Teams.  These Care Teams include your primary Cardiologist (physician) and Advanced Practice Providers (APPs -  Physician Assistants and Nurse Practitioners) who all work together to provide you with the care you need, when you need it.  We  recommend signing up for the patient portal called "MyChart".  Sign up information is provided on this After Visit Summary.  MyChart is used to connect with patients for Virtual Visits (Telemedicine).  Patients are able to view lab/test results, encounter notes, upcoming appointments, etc.  Non-urgent messages can be sent to your provider as well.   To learn more about what you can do with MyChart, go to [email or web address].    Your next appointment:   6 month(s)  The format for your next appointment:   In Person  Provider:   Jenne Campus, MD   Other Instructions  Cardiac CT Angiogram A cardiac CT angiogram is a procedure to look at the heart and the area around the heart. It may be done to help find the cause of chest pains or other symptoms of heart disease. During this procedure, a substance called contrast dye is injected into the blood vessels in the area to be checked. A large X-ray machine, called a CT scanner, then takes detailed pictures of the heart and the surrounding area. The procedure is also sometimes called a coronary CTangiogram, coronary artery scanning, or CTA. A cardiac CT angiogram allows the health care provider to see how well blood is flowing to and from the heart. The health care provider will be able to see if there are any problems, such as: Blockage or narrowing of the coronary arteries in the heart. Fluid around the heart. Signs of weakness or disease in the muscles, valves, and tissues of the heart. Tell a health care provider about: Any allergies you have. This is especially important if you have had a previous allergic reaction to contrast dye. All medicines you are taking, including vitamins, herbs, eye drops, creams, and over-the-counter medicines. Any blood disorders you have. Any surgeries you have had. Any medical conditions you have. Whether you are pregnant or may be pregnant. Any anxiety disorders, chronic pain, or other conditions you have  that may increase your stress or prevent you from lying still. What are the risks? Generally, this is a safe procedure. However, problems may occur, including: Bleeding. Infection. Allergic reactions to medicines or dyes. Damage to other structures or organs. Kidney damage from the contrast dye that is used. Increased risk of cancer from radiation exposure. This risk is low. Talk with your health care provider about: The risks and benefits of testing. How you can receive the lowest dose of radiation. What happens before the procedure? Wear comfortable clothing and remove any jewelry, glasses, dentures, and hearing aids. Follow instructions from your health care provider about eating and drinking. This may include: For 12 hours before the procedure -- avoid caffeine. This includes tea, coffee, soda, energy drinks, and diet pills. Drink plenty of water or other fluids that do not have caffeine in them. Being well hydrated can prevent complications. For 4-6 hours before the procedure -- stop eating and drinking. The contrast dye can cause nausea, but this is less likely if your stomach is empty. Ask your health care provider about changing or stopping your regular medicines. This is especially important if you are taking diabetes medicines, blood thinners, or medicines to treat problems with erections (erectile dysfunction). What happens during the procedure?  Hair on your chest may need to be removed so that small sticky patches called electrodes can be placed on your chest. These will transmit information that helps to monitor your heart during the procedure. An IV will be inserted into one of your veins. You might be given a medicine to control your heart rate during the procedure. This will help to ensure that good images are obtained. You will be asked to lie on an exam table. This table will slide in and out of the CT machine during the procedure. Contrast dye will be injected into the IV.  You might feel warm, or you may get a metallic taste in your mouth. You will be given a medicine called nitroglycerin. This will relax or dilate the arteries in your heart. The table that you are lying on will move into the CT machine tunnel for the scan. The person running the machine will give you instructions while the scans are being done. You may be asked to: Keep your arms above your head. Hold your breath. Stay very still, even if the table is moving. When the scanning is complete, you will be moved out of the machine. The IV will be removed. The procedure may vary among health care providers and hospitals. What can I expect after the procedure? After your procedure, it is common to have: A metallic taste in your mouth from the contrast dye. A feeling of warmth. A headache from the nitroglycerin. Follow these instructions at home: Take over-the-counter and prescription medicines only as told by your health care provider. If you are told, drink enough fluid to keep your urine pale yellow. This will help to flush the contrast dye out of your body. Most people can return to their normal activities right after the procedure. Ask your health care provider what activities are safe for you. It is up to you to get the results of your procedure. Ask your health care provider, or the department that is doing the procedure, when your results will be ready. Keep all follow-up visits as told by your health care provider. This is important. Contact a health care provider if: You have any symptoms of allergy to the contrast dye. These include: Shortness of breath. Rash or hives. A racing heartbeat. Summary A cardiac CT angiogram is a procedure to look at the heart and the area around the heart. It may be done to help find the cause of chest pains or other symptoms of heart disease. During this procedure, a large X-ray machine, called a CT scanner, takes detailed pictures of the heart and the  surrounding area after a contrast dye has been injected into blood vessels in the area. Ask your health care provider about changing or stopping your regular medicines before the procedure. This is especially important if you are taking diabetes medicines, blood thinners, or medicines to treat erectile dysfunction. If you are told, drink enough fluid to keep your urine pale yellow. This will help to flush the contrast dye out of your body. This information is not intended to replace advice given to you by your health care provider. Make sure you discuss any questions you have with your healthcare provider. Document Revised: 01/06/2019 Document Reviewed: 01/06/2019 Elsevier Patient Education  [place].

## 2020-11-24 NOTE — Progress Notes (Signed)
Cardiology Office Note:    Date:  11/24/2020   ID:  Dana [name], DOB 1960/11/28, MRN [id number]  PCP:  Jeanie Sewer, NP  Cardiologist:  Jenne Campus, MD    Referring MD: Jeanie Sewer, NP   Chief Complaint  Patient presents with   Follow-up  Still have some palpitation chest pain  History of Present Illness:    Dana [name] is a 60 y.o. female with past medical history significant for paroxysmal atrial fibrillation.  She is being anticoagulated with Eliquis, her CHADS2 Vascor equals 3 she does have history of remote CVA.  I seen him previously in 2019 that she disappeared from follow-up and then I seen her back in March of this year.  We were talking about potentially doing coronary CT angio because her chest pain.  However she was approved by [facility] and find out that some people dying during the coronary CT angio decided not to do it today during the conversation I realized that she was talking about cardiac catheterization and angiogram note coronary CT angio.  I explained to her that the test we talked about this safer than cardiac catheterization and coronary angiogram and stenting.  She agreed to have it done.  Still described to have palpitations lasting only for few minutes.  No sustained arrhythmias, no dizziness no passing out.  Still described to have some atypical chest pain.  Past Medical History:  Diagnosis Date   Asthma    Atypical chest pain 03/25/2016   Breast cancer ([place])    COPD (chronic obstructive pulmonary disease) ([place])    Dizziness 08/18/2017   Hx of adenomatous polyp of colon 08/09/2009   Hyperthyroidism 01/04/2016   Late effects of CVA (cerebrovascular accident) 03/25/2016   Lymphedema of right upper extremity 08/09/2020   Malignant neoplasm of central portion of breast in female, estrogen receptor negative ([place]) 03/25/2016   Obstructive sleep apnea syndrome 03/25/2016   Paroxysmal atrial fibrillation ([place]) 03/25/2016    Precordial chest pain 08/16/2020    Past Surgical History:  Procedure Laterality Date   ABDOMINAL HYSTERECTOMY     APPENDECTOMY     BREAST BIOPSY     lymph node removal     PORTACATH PLACEMENT     portacath removal     TONSILLECTOMY      Current Medications: Current Meds  Medication Sig   apixaban (ELIQUIS) 5 MG TABS tablet Take 1 tablet (5 mg total) by mouth 2 (two) times daily.   atenolol (TENORMIN) 50 MG tablet Take 50 mg by mouth daily.   atorvastatin (LIPITOR) 20 MG tablet Take 1 tablet by mouth daily.   diltiazem (CARDIZEM) 30 MG tablet Take 1 tablet (30 mg total) by mouth every 6 (six) hours as needed (palpitations).   furosemide (LASIX) 20 MG tablet Take 20 mg by mouth daily as needed for fluid.   Olopatadine HCl 0.2 % SOLN Place 1 drop into both eyes daily as needed. (Patient taking differently: Place 1 drop into both eyes daily as needed (itching).)   pantoprazole (PROTONIX) 40 MG tablet Take 40 mg by mouth daily.   sertraline (ZOLOFT) 100 MG tablet Take 150 mg by mouth daily.    Vitamin D, Ergocalciferol, (DRISDOL) 50000 units CAPS capsule Take 1 capsule by mouth once a week.   [DISCONTINUED] albuterol (VENTOLIN HFA) 108 (90 Base) MCG/ACT inhaler Inhale 2 puffs into the lungs every 6 (six) hours as needed for wheezing or shortness of breath.     Allergies:   Patient  has no known allergies.   Social History   Socioeconomic History   Marital status: Married    Spouse name: Not on file   Number of children: Not on file   Years of education: Not on file   Highest education level: Not on file  Occupational History   Not on file  Tobacco Use   Smoking status: Former    Pack years: 0.00    Types: Cigarettes    Quit date: 02/24/2013    Years since quitting: 7.7   Smokeless tobacco: Never  Vaping Use   Vaping Use: Never used  Substance and Sexual Activity   Alcohol use: Yes    Comment: Once a year   Drug use: No   Sexual activity: Not on file  Other Topics  Concern   Not on file  Social History Narrative   Not on file   Social Determinants of Health   Financial Resource Strain: Not on file  Food Insecurity: Not on file  Transportation Needs: Not on file  Physical Activity: Not on file  Stress: Not on file  Social Connections: Not on file     Family History: The patient's family history includes Hypertension in her maternal grandmother and mother; Lung cancer in her maternal uncle; Ovarian cancer in her paternal aunt and paternal grandfather. ROS:   Please see the history of present illness.    All 14 point review of systems negative except as described per history of present illness  EKGs/Labs/Other Studies Reviewed:      Recent Labs: 04/06/2020: Hemoglobin 11.7; Platelets 237  Recent Lipid Panel No results found for: CHOL, TRIG, HDL, CHOLHDL, VLDL, LDLCALC, LDLDIRECT  Physical Exam:    VS:  BP 140/64 (BP Location: Right Arm, Patient Position: Sitting)   Pulse (!) 54   Ht 5' 8.5" (1.74 m)   Wt 175 lb 12.8 oz (79.7 kg)   SpO2 94%   BMI 26.34 kg/m     Wt Readings from Last 3 Encounters:  11/24/20 175 lb 12.8 oz (79.7 kg)  08/16/20 171 lb (77.6 kg)  08/09/20 179 lb 14.4 oz (81.6 kg)     GEN:  Well nourished, well developed in no acute distress HEENT: Normal NECK: No JVD; No carotid bruits LYMPHATICS: No lymphadenopathy CARDIAC: RRR, no murmurs, no rubs, no gallops RESPIRATORY:  Clear to auscultation without rales, wheezing or rhonchi  ABDOMEN: Soft, non-tender, non-distended MUSCULOSKELETAL:  No edema; No deformity  SKIN: Warm and dry LOWER EXTREMITIES: no swelling NEUROLOGIC:  Alert and oriented x 3 PSYCHIATRIC:  Normal affect   ASSESSMENT:    1. Paroxysmal atrial fibrillation (HCC)   2. Obstructive sleep apnea syndrome   3. Late effects of CVA (cerebrovascular accident)   4. Atypical chest pain    PLAN:    In order of problems listed above:  Paroxysmal atrial fibrillation CHADS2 Vascor equals 3.  We  will continue anticoagulation.  Rare short lasting episode of palpitations. Obstructive sleep apnea: That being followed by antimedicine team Late effect of CVA denies having any Atypical chest pain we will plan For coronary CT angio Dyslipidemia K PN review LDL 105 HDL 45 this is from April 2022.  Decision about aggressiveness of treatment will be made based on results of coronary CT angio.  I do have a CT of her chest from follow-up last year and there were already calcifications in the coronary arteries noted   Medication Adjustments/Labs and Tests Ordered: Current medicines are reviewed at length with the patient  today.  Concerns regarding medicines are outlined above.  No orders of the defined types were placed in this encounter.  Medication changes: No orders of the defined types were placed in this encounter.   Signed, Park Liter, MD, [facility] 11/24/2020 1:41 PM    [place]

## 2020-11-30 ENCOUNTER — Ambulatory Visit: Payer: Medicare Other | Admitting: Cardiovascular Disease

## 2020-12-01 ENCOUNTER — Telehealth (HOSPITAL_COMMUNITY): Payer: Self-pay | Admitting: Emergency Medicine

## 2020-12-01 NOTE — Telephone Encounter (Signed)
Reaching out to patient to offer assistance regarding upcoming cardiac imaging study; pt verbalizes understanding of appt date/time, parking situation and where to check in, pre-test NPO status and medications ordered, and verified current allergies; name and call back number provided for further questions should they arise Marchia Bond RN Navigator Cardiac Imaging [facility] [phone] office [phone] cell  L arm only for IV Denies claustro HR 54 (will not be taking 100mg  metoprolol) Clarise Cruz

## 2020-12-02 LAB — BASIC METABOLIC PANEL
BUN/Creatinine Ratio: 18 (ref 12–28)
BUN: 14 mg/dL (ref 8–27)
CO2: 22 mmol/L (ref 20–29)
Calcium: 9.4 mg/dL (ref 8.7–10.3)
Chloride: 104 mmol/L (ref 96–106)
Creatinine, Ser: 0.79 mg/dL (ref 0.57–1.00)
Glucose: 120 mg/dL — ABNORMAL HIGH (ref 65–99)
Potassium: 3.9 mmol/L (ref 3.5–5.2)
Sodium: 142 mmol/L (ref 134–144)
eGFR: 85 mL/min/{1.73_m2} (ref >59)

## 2020-12-05 ENCOUNTER — Ambulatory Visit (HOSPITAL_COMMUNITY)
Admission: RE | Admit: 2020-12-05 | Discharge: 2020-12-05 | Disposition: A | Discharge status: Home / Self Care | Payer: Medicare Other | Source: Ambulatory Visit | Attending: Cardiology | Admitting: Cardiology

## 2020-12-05 ENCOUNTER — Other Ambulatory Visit (HOSPITAL_COMMUNITY): Payer: Self-pay | Admitting: Cardiology

## 2020-12-05 ENCOUNTER — Other Ambulatory Visit: Payer: Self-pay

## 2020-12-05 ENCOUNTER — Ambulatory Visit (HOSPITAL_COMMUNITY): Payer: Medicare Other

## 2020-12-05 DIAGNOSIS — R0789 Other chest pain: Secondary | ICD-10-CM

## 2020-12-05 DIAGNOSIS — I6990 Unspecified sequelae of unspecified cerebrovascular disease: Secondary | ICD-10-CM

## 2020-12-05 DIAGNOSIS — G4733 Obstructive sleep apnea (adult) (pediatric): Secondary | ICD-10-CM | POA: Diagnosis present

## 2020-12-05 DIAGNOSIS — Z789 Other specified health status: Secondary | ICD-10-CM | POA: Diagnosis present

## 2020-12-05 DIAGNOSIS — I480 Paroxysmal atrial fibrillation: Secondary | ICD-10-CM | POA: Insufficient documentation

## 2020-12-05 MED ORDER — NITROGLYCERIN 0.4 MG SL SUBL
0.8000 mg | SUBLINGUAL_TABLET | Freq: Once | SUBLINGUAL | Status: AC
  Administered 2020-12-05: 0.8 mg via SUBLINGUAL

## 2020-12-05 MED ORDER — IOHEXOL 350 MG/ML SOLN
95.0000 mL | Freq: Once | INTRAVENOUS | Status: AC | PRN
  Administered 2020-12-05: 95 mL via INTRAVENOUS

## 2020-12-05 MED ORDER — NITROGLYCERIN 0.4 MG SL SUBL
SUBLINGUAL_TABLET | SUBLINGUAL | Status: AC
  Filled 2020-12-05: qty 2

## 2020-12-05 NOTE — Progress Notes (Signed)
Arrived to unit. Unit nurse placing line. Fran Lowes, RN VAST

## 2021-02-14 ENCOUNTER — Telehealth: Payer: Self-pay | Admitting: Oncology

## 2021-02-14 NOTE — Telephone Encounter (Signed)
Patient called to [facility] 9/23 Appt's.  She will call back to rescheduled

## 2021-02-16 ENCOUNTER — Inpatient Hospital Stay: Payer: Medicare Other | Admitting: Oncology

## 2021-02-16 ENCOUNTER — Inpatient Hospital Stay: Payer: Medicare Other

## 2021-03-27 NOTE — Progress Notes (Signed)
[facility]  [address] [place],  [place]  [address] [phone]  Clinic Day:  04/04/2021  Referring physician: Hudnell, Stephanie, NP   This document serves as a record of services personally performed by Christine McCarty, MD. It was created on their behalf by Curry,Lauren E, a trained medical scribe. The creation of this record is based on the scribe's personal observations and the provider's statements to them.  CHIEF COMPLAINT:  CC: History of stage I breast cancer  Current Treatment:  Surveillance   HISTORY OF PRESENT ILLNESS:  Dana [name] is a 60 y.o. female with a history of stage I breast cancer diagnosed in August 1999.  This was a 1.1 cm invasive ductal carcinoma, treated with lumpectomy, CMF chemotherapy, and radiation.  She did not tolerate tamoxifen, but has never had evidence of recurrence.  We began seeing her in August 2004, when she moved to the area.  She is on yearly follow-up, but was last seen in August of 2016.  We did CT scans in April of 2016, and found no evidence of malignancy, but there was a 6 mm nodule in the posterior right upper lobe on the CT chest.  She was seen in July with CT chest to follow up on the lung nodule, and it had resolved.  Due to her personal history of breast cancer diagnosed at age 36, we have recommended genetic testing with the myRisk Hereditary Cancer Panel testing on July 11th, 2016 with [facility] and that result was negative.  She has had a total abdominal hysterectomy for dysfunctional uterine bleeding, but her ovaries are intact.  She states her last colonoscopy was in 2011, and she is due for this again. She had 2 polyps removed at that time, one of which was a tubular adenoma.  She also has atrial fibrillation, history of pneumonia, mild to moderate tricuspid regurgitation, and history of prior stroke in 2014.  I had seen her in October 2017 and we evaluated multiple issues  that she was dealing with.  She is off the Prilosec and now on Protonix 40 mg daily and also on vitamin D2 1.25 mg weekly, in addition to her usual medications.  She had a squamous cell carcinoma removed from her right shoulder, and a squamous cell carcinoma in situ removed from the left anterior chest.  CT imaging from September 2021 revealed stable, benign small pulmonary nodules. No further routine CT follow-up is required for these nodules. Consider ongoing annual low-dose CT lung cancer screening if indicated by patient age, smoking history, and/or other risk factors for lung cancer.   She does have a history of peptic ulcer disease.  She has had some anxiety and depression.  INTERVAL HISTORY:  Dana [name] is here for routine follow up and to review recent imaging results. CT chest from November 4th revealed a stable exam with no new or progressive findings. The scattered tiny bilateral pulmonary nodules are unchanged in the interval. Stability of these nodules are considered benign with no further routine follow-up indicated. It has been 8 years since she stopped smoking, so she still is a candidate for annual low dose lung cancer screening. She continues to have generalized arthralgias and myalgias. Her pain does disturb her sleep despite ibuprofen. She is concerned about taking excessive doses of ibuprofen due to the risk of GI bleed. I will try her on tramadol 50 mg Q6H prn, but advised that she take this at bedtime. She recently had routine   lab work through [facility], and we will request the results. Her  appetite is good, and she has lost 4 pounds since her last visit.  She denies fever, chills or other signs of infection.  She denies nausea, vomiting, bowel issues, or abdominal pain.  She denies sore throat, cough, dyspnea, or chest pain.  REVIEW OF SYSTEMS:  Review of Systems  Constitutional: Negative.  Negative for appetite change, chills, fatigue, fever and unexpected weight change.  HENT:   Negative.    Eyes: Negative.   Respiratory: Negative.  Negative for chest tightness, cough, hemoptysis, shortness of breath and wheezing.   Cardiovascular: Negative.  Negative for chest pain, leg swelling and palpitations.  Gastrointestinal: Negative.  Negative for abdominal distention, abdominal pain, blood in stool, constipation, diarrhea, nausea and vomiting.  Endocrine: Negative.   Genitourinary: Negative.  Negative for difficulty urinating, dysuria, frequency and hematuria.   Musculoskeletal:  Positive for arthralgias (generalized) and myalgias (generalized). Negative for back pain, flank pain and gait problem.  Skin: Negative.   Neurological: Negative.  Negative for dizziness, extremity weakness, gait problem, headaches, light-headedness, numbness, seizures and speech difficulty.  Hematological: Negative.   Psychiatric/Behavioral:  Positive for sleep disturbance (due to generalized pain). Negative for depression. The patient is not nervous/anxious.     VITALS:  Blood pressure (!) 179/77, pulse (!) 54, temperature 98.1 F (36.7 C), temperature source Oral, resp. rate 18, height 5' 8.5" (1.74 m), weight 175 lb 11.2 oz (79.7 kg), SpO2 95 %.  Wt Readings from Last 3 Encounters:  04/04/21 175 lb 11.2 oz (79.7 kg)  11/24/20 175 lb 12.8 oz (79.7 kg)  08/16/20 171 lb (77.6 kg)    Body mass index is 26.33 kg/m.  Performance status (ECOG): 1 - Symptomatic but completely ambulatory  PHYSICAL EXAM:  Physical Exam Constitutional:      General: She is not in acute distress.    Appearance: Normal appearance. She is normal weight.  HENT:     Head: Normocephalic and atraumatic.  Eyes:     General: No scleral icterus.    Extraocular Movements: Extraocular movements intact.     Conjunctiva/sclera: Conjunctivae normal.     Pupils: Pupils are equal, round, and reactive to light.  Cardiovascular:     Rate and Rhythm: Regular rhythm. Bradycardia present.     Pulses: Normal pulses.     Heart  sounds: Normal heart sounds. No murmur heard.   No friction rub. No gallop.  Pulmonary:     Effort: Pulmonary effort is normal. No respiratory distress.     Breath sounds: Normal breath sounds.  Chest:  Breasts:    Right: Normal.     Left: Normal.     Comments: Well healed scar that is barely visible in the upper outer quadrant of the right breast and a well healed scar of the right axilla. Both breasts are without masses. Abdominal:     General: Bowel sounds are normal. There is no distension.     Palpations: Abdomen is soft. There is no hepatomegaly, splenomegaly or mass.     Tenderness: There is no abdominal tenderness.  Musculoskeletal:        General: Normal range of motion.     Cervical back: Normal range of motion and neck supple.     Right lower leg: No edema.     Left lower leg: No edema.  Lymphadenopathy:     Cervical: No cervical adenopathy.  Skin:    General: Skin is warm and dry.    Neurological:     General: No focal deficit present.     Mental Status: She is alert and oriented to person, place, and time. Mental status is at baseline.  Psychiatric:        Mood and Affect: Mood normal.        Behavior: Behavior normal.        Thought Content: Thought content normal.        Judgment: Judgment normal.    LABS:   CBC Latest Ref Rng & Units 04/06/2020 10/27/2017  WBC 4.0 - 10.5 K/uL 8.1 8.8  Hemoglobin 12.0 - 15.0 g/dL 11.7(L) 12.5  Hematocrit 36.0 - 46.0 % 37.2 37.6  Platelets 150 - 400 K/uL 237 210   CMP Latest Ref Rng & Units 12/01/2020  Glucose 65 - 99 mg/dL 120(H)  BUN 8 - 27 mg/dL 14  Creatinine 0.57 - 1.00 mg/dL 0.79  Sodium 134 - 144 mmol/L 142  Potassium 3.5 - 5.2 mmol/L 3.9  Chloride 96 - 106 mmol/L 104  CO2 20 - 29 mmol/L 22  Calcium 8.7 - 10.3 mg/dL 9.4     Lab Results  Component Value Date   TIBC 467 (H) 04/06/2020   FERRITIN 12 04/06/2020   IRONPCTSAT 10 (L) 04/06/2020   Lab Results  Component Value Date   LDH 163 04/06/2020      STUDIES:   EXAM: 03/30/21 CT CHEST WITHOUT CONTRAST  TECHNIQUE: Multidetector CT imaging of the chest was performed following the standard protocol without IV contrast.  COMPARISON: 02/15/2020  FINDINGS: Cardiovascular: The heart size is normal. No substantial pericardial effusion. Coronary artery calcification is evident. Mild atherosclerotic calcification is noted in the wall of the thoracic aorta. Mediastinum/Nodes: No mediastinal lymphadenopathy. No evidence for gross hilar lymphadenopathy although assessment is limited by the lack of intravenous contrast on the current study. Small hiatal hernia. The esophagus has normal imaging features. There is no axillary lymphadenopathy. Surgical clips noted right axilla. Lungs/Pleura: Centrilobular emphsyema noted. Biapical pleuroparenchymal scarring. Stable subpleural scarring right lower lung. 6 mm perifissural nodule right lower lobe seen previously is stable on 43/4 today. Previously described 2 mm posterior left upper lobe nodule (48/4) is unchanged. 3 mm right middle lobe perifissural nodule seen medially on 71/4 is unchanged. There is no new suspicious pulmonary nodule or mass. No focal airspace consolidation. No pleural effusion. Upper Abdomen: Unremarkable Musculoskeletal: No worrisome lytic or sclerotic osseous  abnormality. IMPRESSION: 1. Stable exam. No new or progressive findings. 2. Scattered tiny bilateral pulmonary nodules are unchanged in the interval. Stability, nodules are considered benign with no further routine follow-up indicated. 3. Small hiatal hernia. 4. Aortic Atherosclerosis (ICD10-I70.0) and Emphysema (ICD10-J43.9).  Allergies: No Known Allergies  Current Medications: Current Outpatient Medications  Medication Sig Dispense Refill   apixaban (ELIQUIS) 5 MG TABS tablet Take 1 tablet (5 mg total) by mouth 2 (two) times daily. 180 tablet 3   atenolol (TENORMIN) 50 MG tablet Take 50 mg by mouth  daily.     atorvastatin (LIPITOR) 20 MG tablet Take 1 tablet by mouth daily.     diltiazem (CARDIZEM) 30 MG tablet Take 1 tablet (30 mg total) by mouth every 6 (six) hours as needed (palpitations). 30 tablet 1   metoprolol tartrate (LOPRESSOR) 100 MG tablet Take 1 tablet (100 mg total) by mouth once for 1 dose. 2 hours before ct 1 tablet 0   Olopatadine HCl 0.2 % SOLN Place 1 drop into both eyes daily as needed. (Patient taking differently: Place 1 drop   into both eyes daily as needed (itching).) 2.5 mL 5   pantoprazole (PROTONIX) 40 MG tablet Take 40 mg by mouth daily.     sertraline (ZOLOFT) 100 MG tablet Take 150 mg by mouth daily.      torsemide (DEMADEX) 10 MG tablet Take 1 tablet (10 mg total) by mouth daily. 30 tablet 1   Vitamin D, Ergocalciferol, (DRISDOL) 50000 units CAPS capsule Take 1 capsule by mouth once a week.     No current facility-administered medications for this visit.     ASSESSMENT & PLAN:   Assessment:   1.  Stage I breast cancer diagnosed in August 1999.  She remains without evidence of recurrence.  2.  Anemia, first appreciated in August 2021.  Iron studies were consistent with deficiency with a saturation of 14% at that time.  Currently her anemia has resolved, and iron studies are within a good range.  3.  Former smoker with COPD. It has been 8 years since she quit smoking.  We will continue annual low dose lung cancer screening CT scans.  4. Generalized arthralgias and myalgias, which disturbs her sleep. I suspect she has fibromyalgia. This persists despite ibuprofen, and the patient is concerned about the associated risk of GI bleed with excessive dosing. I will try her on tramadol 50 mg Q6H prn, and advised that she take this at night.  Plan: CT chest from November remains stable, and we will continue with annual lung cancer screening. In view of her generalized pain despite ibuprofen, I will try her on tramadol 50 mg QH prn, and advised that she take this at  bedtime. We will request her recent lab work from her primary care office. Otherwise, I will see her back in 1 year with low dose CT chest for lung cancer screening for repeat evaluation.  She understands and agrees with this plan of care.   I provided 20 minutes of face-to-face time during this this encounter and > 50% was spent counseling as documented under my assessment and plan.    Derwood Kaplan, MD [facility] [address] [place] [address] Dept: [phone] Dept Fax: [phone]   I, Rita Ohara, am acting as scribe for Derwood Kaplan, MD  I have reviewed this report as typed by the medical scribe, and it is complete and accurate.

## 2021-03-30 MED ORDER — TORSEMIDE 10 MG PO TABS: 10.0000 mg | ORAL_TABLET | Freq: Every day | ORAL | 1 refills | Status: DC

## 2021-04-03 ENCOUNTER — Encounter: Payer: Self-pay | Admitting: Oncology

## 2021-04-04 ENCOUNTER — Telehealth: Payer: Self-pay | Admitting: Oncology

## 2021-04-04 ENCOUNTER — Inpatient Hospital Stay: Payer: Medicare Other | Attending: Oncology | Admitting: Oncology

## 2021-04-04 ENCOUNTER — Encounter: Payer: Self-pay | Admitting: Oncology

## 2021-04-04 ENCOUNTER — Other Ambulatory Visit: Payer: Self-pay | Admitting: Oncology

## 2021-04-04 ENCOUNTER — Inpatient Hospital Stay: Payer: Medicare Other

## 2021-04-04 ENCOUNTER — Other Ambulatory Visit: Payer: Self-pay

## 2021-04-04 VITALS — BP 179/77 | HR 54 | Temp 98.1°F | Resp 18 | Ht 68.5 in | Wt 175.7 lb

## 2021-04-04 DIAGNOSIS — C50111 Malignant neoplasm of central portion of right female breast: Secondary | ICD-10-CM | POA: Diagnosis not present

## 2021-04-04 DIAGNOSIS — R0789 Other chest pain: Secondary | ICD-10-CM

## 2021-04-04 DIAGNOSIS — Z1231 Encounter for screening mammogram for malignant neoplasm of breast: Secondary | ICD-10-CM

## 2021-04-04 DIAGNOSIS — Z171 Estrogen receptor negative status [ER-]: Secondary | ICD-10-CM

## 2021-04-04 DIAGNOSIS — I890 Lymphedema, not elsewhere classified: Secondary | ICD-10-CM | POA: Diagnosis not present

## 2021-04-04 DIAGNOSIS — Z87891 Personal history of nicotine dependence: Secondary | ICD-10-CM

## 2021-04-04 MED ORDER — TRAMADOL HCL 50 MG PO TABS: 50.0000 mg | ORAL_TABLET | Freq: Four times a day (QID) | ORAL | 3 refills | Status: DC | PRN

## 2021-04-04 NOTE — Telephone Encounter (Signed)
Per 11/9 los next appts scheduled and given to patient

## 2021-04-06 ENCOUNTER — Encounter: Payer: Self-pay | Admitting: Oncology

## 2021-04-06 LAB — BASIC METABOLIC PANEL
BUN: 12 (ref 4–21)
CO2: 24 — AB (ref 13–22)
Chloride: 102 (ref 99–108)
Creatinine: 0.7 (ref 0.5–1.1)
Glucose: 96
Potassium: 3.5 (ref 3.4–5.3)
Sodium: 140 (ref 137–147)

## 2021-04-06 LAB — VITAMIN D 25 HYDROXY (VIT D DEFICIENCY, FRACTURES): Vit D, 25-Hydroxy: 90.8

## 2021-04-06 LAB — LIPID PANEL
Cholesterol: 163 (ref 0–200)
HDL: 42 (ref 35–70)
LDL Cholesterol: 97
LDl/HDL Ratio: 2.3
Triglycerides: 138 (ref 40–160)

## 2021-04-06 LAB — HEPATIC FUNCTION PANEL
ALT: 16 (ref 7–35)
AST: 20 (ref 13–35)
Alkaline Phosphatase: 115 (ref 25–125)
Bilirubin, Total: 0.5

## 2021-04-06 LAB — COMPREHENSIVE METABOLIC PANEL
Albumin: 4.4 (ref 3.5–5.0)
Calcium: 9.3 (ref 8.7–10.7)

## 2021-04-06 LAB — CBC AND DIFFERENTIAL
HCT: 38 (ref 36–46)
Hemoglobin: 12.7 (ref 12.0–16.0)
Neutrophils Absolute: 4.6
Platelets: 188 (ref 150–399)
WBC: 6.8

## 2021-04-06 LAB — VITAMIN B12: Vitamin B-12: 706

## 2021-04-06 LAB — CBC: RBC: 4.54 (ref 3.87–5.11)

## 2021-04-06 LAB — TSH: TSH: 1.1 (ref 0.41–5.90)

## 2021-04-10 ENCOUNTER — Telehealth: Payer: Self-pay

## 2021-04-10 NOTE — Telephone Encounter (Signed)
Called patient and notified patient of lab results that was faxed to [place] from [facility] in [place].

## 2021-04-17 DIAGNOSIS — R918 Other nonspecific abnormal finding of lung field: Secondary | ICD-10-CM | POA: Insufficient documentation

## 2021-05-04 ENCOUNTER — Other Ambulatory Visit: Payer: Self-pay | Admitting: Cardiology

## 2021-05-08 ENCOUNTER — Telehealth: Payer: Self-pay

## 2021-05-08 NOTE — Telephone Encounter (Signed)
Patient notified

## 2021-05-08 NOTE — Telephone Encounter (Signed)
-----   Message from Derwood Kaplan, MD sent at 05/07/2021  3:22 PM EST ----- Regarding: call Tell her mammo is clear

## 2021-05-10 ENCOUNTER — Encounter: Payer: Self-pay | Admitting: Oncology

## 2021-06-21 ENCOUNTER — Ambulatory Visit (INDEPENDENT_AMBULATORY_CARE_PROVIDER_SITE_OTHER): Payer: Medicare Other | Admitting: Cardiology

## 2021-06-21 ENCOUNTER — Encounter: Payer: Self-pay | Admitting: Cardiology

## 2021-06-21 ENCOUNTER — Other Ambulatory Visit: Payer: Self-pay

## 2021-06-21 VITALS — BP 130/62 | HR 60 | Ht 67.0 in | Wt 176.6 lb

## 2021-06-21 DIAGNOSIS — I480 Paroxysmal atrial fibrillation: Secondary | ICD-10-CM | POA: Diagnosis not present

## 2021-06-21 DIAGNOSIS — G4733 Obstructive sleep apnea (adult) (pediatric): Secondary | ICD-10-CM | POA: Diagnosis not present

## 2021-06-21 DIAGNOSIS — J449 Chronic obstructive pulmonary disease, unspecified: Secondary | ICD-10-CM | POA: Diagnosis not present

## 2021-06-21 DIAGNOSIS — I6990 Unspecified sequelae of unspecified cerebrovascular disease: Secondary | ICD-10-CM | POA: Diagnosis not present

## 2021-06-21 DIAGNOSIS — R072 Precordial pain: Secondary | ICD-10-CM

## 2021-06-21 MED ORDER — ATORVASTATIN CALCIUM 40 MG PO TABS: 40.0000 mg | ORAL_TABLET | Freq: Every day | ORAL | 3 refills | Status: DC

## 2021-06-21 NOTE — Progress Notes (Signed)
Cardiology Office Note:    Date:  06/21/2021   ID:  Dana [name], DOB 10/04/1960, MRN [id number]  PCP:  Darrol Jump, PA-C  Cardiologist:  Jenne Campus, MD    Referring MD: Jeanie Sewer, NP   Chief Complaint  Patient presents with   Follow-up  Doing fine  History of Present Illness:    Dana [name] is a 61 y.o. female with past medical history significant for paroxysmal atrial fibrillation, her CHADS2 Vascor equals 3 because of being a woman history of CVA.  She is anticoagulated with Eliquis and have no difficulty taking that medication.  Also she was complaining of having chest pain and she is having episode of palpitations.  Coronary CT angio has been performed which showed only minimal non obstructive disease.  Calcium score was 54.6.  CT of her chest showed moderate hiatal hernia as well as aortic atherosclerosis.  Past Medical History:  Diagnosis Date   Asthma    Atypical chest pain 03/25/2016   Breast cancer ([place])    COPD (chronic obstructive pulmonary disease) ([place])    Dizziness 08/18/2017   Hx of adenomatous polyp of colon 08/09/2009   Hyperthyroidism 01/04/2016   Late effects of CVA (cerebrovascular accident) 03/25/2016   Lymphedema of right upper extremity 08/09/2020   Malignant neoplasm of central portion of breast in female, estrogen receptor negative ([place]) 03/25/2016   Obstructive sleep apnea syndrome 03/25/2016   Paroxysmal atrial fibrillation ([place]) 03/25/2016   Precordial chest pain 08/16/2020    Past Surgical History:  Procedure Laterality Date   ABDOMINAL HYSTERECTOMY     APPENDECTOMY     BREAST BIOPSY     lymph node removal     PORTACATH PLACEMENT     portacath removal     TONSILLECTOMY      Current Medications: Current Meds  Medication Sig   apixaban (ELIQUIS) 5 MG TABS tablet Take 1 tablet (5 mg total) by mouth 2 (two) times daily.   atenolol (TENORMIN) 50 MG tablet Take 50 mg by mouth daily.   atorvastatin (LIPITOR) 20 MG  tablet Take 1 tablet by mouth daily.   diltiazem (CARDIZEM) 30 MG tablet Take 1 tablet (30 mg total) by mouth every 6 (six) hours as needed (palpitations).   hydrOXYzine (ATARAX/VISTARIL) 25 MG tablet Take 12.5-25 mg by mouth at bedtime.   Olopatadine HCl 0.2 % SOLN Place 1 drop into both eyes daily as needed. (Patient taking differently: Place 1 drop into both eyes daily as needed (itching).)   pantoprazole (PROTONIX) 40 MG tablet Take 40 mg by mouth daily.   sertraline (ZOLOFT) 100 MG tablet Take 150 mg by mouth daily.    torsemide (DEMADEX) 10 MG tablet TAKE ONE TABLET BY MOUTH ONCE DAILY (Patient taking differently: Take 10 mg by mouth daily.)   traMADol (ULTRAM) 50 MG tablet Take 1 tablet (50 mg total) by mouth every 6 (six) hours as needed for moderate pain.     Allergies:   Patient has no known allergies.   Social History   Socioeconomic History   Marital status: Married    Spouse name: Not on file   Number of children: Not on file   Years of education: Not on file   Highest education level: Not on file  Occupational History   Not on file  Tobacco Use   Smoking status: Former    Types: Cigarettes    Quit date: 02/24/2013    Years since quitting: 8.3   Smokeless tobacco: Never  Vaping Use   Vaping Use: Never used  Substance and Sexual Activity   Alcohol use: Yes    Comment: Once a year   Drug use: No   Sexual activity: Not on file  Other Topics Concern   Not on file  Social History Narrative   Not on file   Social Determinants of Health   Financial Resource Strain: Not on file  Food Insecurity: Not on file  Transportation Needs: Not on file  Physical Activity: Not on file  Stress: Not on file  Social Connections: Not on file     Family History: The patient's family history includes Hypertension in her maternal grandmother and mother; Lung cancer in her maternal uncle; Ovarian cancer in her paternal aunt and paternal grandfather. ROS:   Please see the history  of present illness.    All 14 point review of systems negative except as described per history of present illness  EKGs/Labs/Other Studies Reviewed:      Recent Labs: 04/06/2021: ALT 16; BUN 12; Creatinine 0.7; Hemoglobin 12.7; Platelets 188; Potassium 3.5; Sodium 140; TSH 1.10  Recent Lipid Panel    Component Value Date/Time   CHOL 163 04/06/2021 0000   TRIG 138 04/06/2021 0000   HDL 42 04/06/2021 0000   LDLCALC 97 04/06/2021 0000    Physical Exam:    VS:  BP 130/62 (BP Location: Left Arm, Patient Position: Sitting)    Pulse 60    Ht 5\' 7"  (1.702 m)    Wt 176 lb 9.6 oz (80.1 kg)    SpO2 95%    BMI 27.66 kg/m     Wt Readings from Last 3 Encounters:  06/21/21 176 lb 9.6 oz (80.1 kg)  04/04/21 175 lb 11.2 oz (79.7 kg)  11/24/20 175 lb 12.8 oz (79.7 kg)     GEN:  Well nourished, well developed in no acute distress HEENT: Normal NECK: No JVD; No carotid bruits LYMPHATICS: No lymphadenopathy CARDIAC: RRR, no murmurs, no rubs, no gallops RESPIRATORY:  Clear to auscultation without rales, wheezing or rhonchi  ABDOMEN: Soft, non-tender, non-distended MUSCULOSKELETAL:  No edema; No deformity  SKIN: Warm and dry LOWER EXTREMITIES: no swelling NEUROLOGIC:  Alert and oriented x 3 PSYCHIATRIC:  Normal affect   ASSESSMENT:    1. Paroxysmal atrial fibrillation (HCC)   2. Obstructive sleep apnea syndrome   3. Chronic obstructive pulmonary disease, unspecified COPD type ([place])   4. Late effects of CVA (cerebrovascular accident)   5. Precordial chest pain    PLAN:    In order of problems listed above:  Paroxysmal atrial fibrillation denies have any palpitations continue present management.  Continue anticoagulation. Obstructive sleep apnea that being followed by antimedicine team. COPD related to smoking likely she does not smoke anymore. Late effects of CVA does not have any more problems. Coronary artery disease does have minimal nonobstructive disease decreased risk factors  modifications.  I did review K PN which only LDL of 97 HDL 42 this is from 04/06/2021.  We will increase dose of Lipitor from 20 mg daily to 40 mg daily, fasting lipid profile need to be done   Medication Adjustments/Labs and Tests Ordered: Current medicines are reviewed at length with the patient today.  Concerns regarding medicines are outlined above.  No orders of the defined types were placed in this encounter.  Medication changes: No orders of the defined types were placed in this encounter.   Signed, Park Liter, MD, [facility] 06/21/2021 3:32 PM    [facility]

## 2021-06-21 NOTE — Patient Instructions (Signed)
Medication Instructions:  Your physician has recommended you make the following change in your medication:   START: Lipitor 40 mg daily  *If you need a refill on your cardiac medications before your next appointment, please call your pharmacy*   Lab Work: Your physician recommends that you return for lab work in: Labs in 6 weeks: Lipids If you have labs (blood work) drawn today and your tests are completely normal, you will receive your results only by: [place] (if you have [place]) OR A paper copy in the mail If you have any lab test that is abnormal or we need to change your treatment, we will call you to review the results.   Testing/Procedures: None   Follow-Up: At [facility], you and your health needs are our priority.  As part of our continuing mission to provide you with exceptional heart care, we have created designated Provider Care Teams.  These Care Teams include your primary Cardiologist (physician) and Advanced Practice Providers (APPs -  Physician Assistants and Nurse Practitioners) who all work together to provide you with the care you need, when you need it.  We recommend signing up for the patient portal called "MyChart".  Sign up information is provided on this After Visit Summary.  MyChart is used to connect with patients for Virtual Visits (Telemedicine).  Patients are able to view lab/test results, encounter notes, upcoming appointments, etc.  Non-urgent messages can be sent to your provider as well.   To learn more about what you can do with MyChart, go to [email or web address].    Your next appointment:   6 month(s)  The format for your next appointment:   In Person  Provider:   Jenne Campus, MD    Other Instructions None

## 2021-07-26 ENCOUNTER — Other Ambulatory Visit: Payer: Self-pay | Admitting: Cardiology

## 2021-08-22 LAB — LIPID PANEL
Chol/HDL Ratio: 3.5 ratio (ref 0.0–4.4)
Cholesterol, Total: 153 mg/dL (ref 100–199)
HDL: 44 mg/dL (ref >39)
LDL Chol Calc (NIH): 87 mg/dL (ref 0–99)
Triglycerides: 121 mg/dL (ref 0–149)
VLDL Cholesterol Cal: 22 mg/dL (ref 5–40)

## 2021-08-27 ENCOUNTER — Other Ambulatory Visit: Payer: Self-pay | Admitting: Oncology

## 2021-08-27 DIAGNOSIS — R0789 Other chest pain: Secondary | ICD-10-CM

## 2021-08-28 ENCOUNTER — Other Ambulatory Visit: Payer: Self-pay | Admitting: Allergy

## 2021-08-28 ENCOUNTER — Telehealth: Payer: Self-pay

## 2021-08-28 DIAGNOSIS — E782 Mixed hyperlipidemia: Secondary | ICD-10-CM

## 2021-08-28 MED ORDER — ATORVASTATIN CALCIUM 80 MG PO TABS: 80.0000 mg | ORAL_TABLET | Freq: Every day | ORAL | 3 refills | Status: DC

## 2021-08-28 NOTE — Telephone Encounter (Signed)
-----   Message from Park Liter, MD sent at 08/23/2021 11:04 AM EDT ----- ?Cholesterol still not perfect.  Please ask to increase dose of Lipitor to 80 mg daily, fasting lipid profile, AST LT 6 weeks ?

## 2021-08-28 NOTE — Telephone Encounter (Signed)
Spoke with pt about lab results. She stated that she has been dizzy and saw her PCP for dizziness. She stated that her blood pressures were WNL. She wondered if the Lipitor could cause dizziness. Per Dr. Agustin Cree, She should continue to the increased dose of Lipitor and follow up with lab work in 6 weeks. She had no other questions or concerns.  ?

## 2021-09-27 ENCOUNTER — Other Ambulatory Visit: Payer: Self-pay | Admitting: Cardiology

## 2021-09-27 NOTE — Telephone Encounter (Signed)
Torsemide 10 mg # 30 x 2 refills sent to [facility], [place] ?

## 2021-12-19 ENCOUNTER — Ambulatory Visit: Payer: Medicare Other | Admitting: Cardiology

## 2021-12-26 ENCOUNTER — Ambulatory Visit: Payer: Medicare Other | Admitting: Cardiology

## 2022-01-16 ENCOUNTER — Encounter: Payer: Self-pay | Admitting: Cardiology

## 2022-01-18 ENCOUNTER — Ambulatory Visit (INDEPENDENT_AMBULATORY_CARE_PROVIDER_SITE_OTHER): Payer: Medicare Other | Admitting: Cardiology

## 2022-01-18 ENCOUNTER — Encounter: Payer: Self-pay | Admitting: Cardiology

## 2022-01-18 VITALS — BP 136/72 | HR 68 | Ht 68.0 in | Wt 175.4 lb

## 2022-01-18 DIAGNOSIS — I6990 Unspecified sequelae of unspecified cerebrovascular disease: Secondary | ICD-10-CM

## 2022-01-18 DIAGNOSIS — I480 Paroxysmal atrial fibrillation: Secondary | ICD-10-CM

## 2022-01-18 DIAGNOSIS — G4733 Obstructive sleep apnea (adult) (pediatric): Secondary | ICD-10-CM | POA: Diagnosis not present

## 2022-01-18 DIAGNOSIS — J449 Chronic obstructive pulmonary disease, unspecified: Secondary | ICD-10-CM

## 2022-01-18 NOTE — Progress Notes (Unsigned)
Cardiology Office Note:    Date:  01/18/2022   ID:  Dana [name], DOB Jan 09, 1961, MRN [id number]  PCP:  Darrol Jump, PA-C  Cardiologist:  Jenne Campus, MD    Referring MD: Darrol Jump, PA-C   Chief Complaint  Patient presents with   Follow-up  Doing well  History of Present Illness:    Dana [name] is a 61 y.o. female with past medical history significant for paroxysmal atrial fibrillation, her CHADS2 Vascor equals 3 for being a woman as well as history of CVA, she is anticoagulated with Eliquis.  Also have history of coronary CT angio which showed calcium score of 54.6 minimal nonobstructive disease involving proximal LAD, dyslipidemia. She is in my office today for follow-up.  Overall she is doing well.  She denies have any chest pain tightness squeezing pressure burning chest no palpitations dizziness swelling of lower extremities  Past Medical History:  Diagnosis Date   Asthma    Atypical chest pain 03/25/2016   Breast cancer ([place])    COPD (chronic obstructive pulmonary disease) ([place])    Dizziness 08/18/2017   Hx of adenomatous polyp of colon 08/09/2009   Hyperthyroidism 01/04/2016   Late effects of CVA (cerebrovascular accident) 03/25/2016   Lymphedema of right upper extremity 08/09/2020   Malignant neoplasm of central portion of breast in female, estrogen receptor negative ([place]) 03/25/2016   Obstructive sleep apnea syndrome 03/25/2016   Paroxysmal atrial fibrillation ([place]) 03/25/2016   Precordial chest pain 08/16/2020    Past Surgical History:  Procedure Laterality Date   ABDOMINAL HYSTERECTOMY     APPENDECTOMY     BREAST BIOPSY     lymph node removal     PORTACATH PLACEMENT     portacath removal     TONSILLECTOMY      Current Medications: Current Meds  Medication Sig   apixaban (ELIQUIS) 5 MG TABS tablet Take 1 tablet (5 mg total) by mouth 2 (two) times daily.   atenolol (TENORMIN) 50 MG tablet Take 50 mg by mouth daily.   atorvastatin  (LIPITOR) 80 MG tablet Take 1 tablet (80 mg total) by mouth daily.   diltiazem (CARDIZEM) 30 MG tablet Take 1 tablet (30 mg total) by mouth every 6 (six) hours as needed (palpitations).   hydrOXYzine (ATARAX/VISTARIL) 25 MG tablet Take 12.5-25 mg by mouth at bedtime.   Olopatadine HCl 0.2 % SOLN Place 1 drop into both eyes daily as needed. (Patient taking differently: Place 1 drop into both eyes daily as needed (itching).)   pantoprazole (PROTONIX) 40 MG tablet Take 40 mg by mouth daily.   sertraline (ZOLOFT) 100 MG tablet Take 150 mg by mouth daily.    torsemide (DEMADEX) 10 MG tablet TAKE ONE TABLET BY MOUTH ONCE DAILY (Patient taking differently: Take 10 mg by mouth daily.)   traMADol (ULTRAM) 50 MG tablet Take 1 tablet (50 mg total) by mouth every 6 (six) hours as needed for moderate pain.     Allergies:   Patient has no known allergies.   Social History   Socioeconomic History   Marital status: Married    Spouse name: Not on file   Number of children: Not on file   Years of education: Not on file   Highest education level: Not on file  Occupational History   Not on file  Tobacco Use   Smoking status: Former    Types: Cigarettes    Quit date: 02/24/2013    Years since quitting: 8.9   Smokeless tobacco:  Never  Vaping Use   Vaping Use: Never used  Substance and Sexual Activity   Alcohol use: Yes    Comment: Once a year   Drug use: No   Sexual activity: Not on file  Other Topics Concern   Not on file  Social History Narrative   Not on file   Social Determinants of Health   Financial Resource Strain: Not on file  Food Insecurity: Not on file  Transportation Needs: Not on file  Physical Activity: Not on file  Stress: Not on file  Social Connections: Not on file     Family History: The patient's family history includes Hypertension in her maternal grandmother and mother; Lung cancer in her maternal uncle; Ovarian cancer in her paternal aunt and paternal  grandfather. ROS:   Please see the history of present illness.    All 14 point review of systems negative except as described per history of present illness  EKGs/Labs/Other Studies Reviewed:      Recent Labs: 04/06/2021: ALT 16; BUN 12; Creatinine 0.7; Hemoglobin 12.7; Platelets 188; Potassium 3.5; Sodium 140; TSH 1.10  Recent Lipid Panel    Component Value Date/Time   CHOL 153 08/21/2021 1309   TRIG 121 08/21/2021 1309   HDL 44 08/21/2021 1309   CHOLHDL 3.5 08/21/2021 1309   LDLCALC 87 08/21/2021 1309    Physical Exam:    VS:  BP 136/72 (BP Location: Left Arm, Patient Position: Sitting)   Pulse 68   Ht 5\' 8"  (1.727 m)   Wt 175 lb 6.4 oz (79.6 kg)   SpO2 93%   BMI 26.67 kg/m     Wt Readings from Last 3 Encounters:  01/18/22 175 lb 6.4 oz (79.6 kg)  06/21/21 176 lb 9.6 oz (80.1 kg)  04/04/21 175 lb 11.2 oz (79.7 kg)     GEN:  Well nourished, well developed in no acute distress HEENT: Normal NECK: No JVD; No carotid bruits LYMPHATICS: No lymphadenopathy CARDIAC: RRR, soft stock murmur grade 1/6 pressure right upper portion of the sternum no rubs, no gallops RESPIRATORY:  Clear to auscultation without rales, wheezing or rhonchi  ABDOMEN: Soft, non-tender, non-distended MUSCULOSKELETAL:  No edema; No deformity  SKIN: Warm and dry LOWER EXTREMITIES: no swelling NEUROLOGIC:  Alert and oriented x 3 PSYCHIATRIC:  Normal affect   ASSESSMENT:    1. Paroxysmal atrial fibrillation (HCC)   2. Obstructive sleep apnea syndrome   3. Chronic obstructive pulmonary disease, unspecified COPD type ([place])   4. Late effects of CVA (cerebrovascular accident)    PLAN:    In order of problems listed above:  Paroxysmal atrial fibrillation, she maintained sinus rhythm.  Her EKG today confirmed that.  She is anticoagulant which I continue. Obstructive sleep apnea: That followed by internal medicine team Late effect of CVA, stable no new TIA/CVA-like symptoms Coronary artery disease  with only minimal disease involving proximal LAD.  Asymptomatic. Dyslipidemia.  I did review K PN she is taking high intense statin in form of Lipitor 80 K PN show me LDL 75 HDL 53.  Continue present management   Medication Adjustments/Labs and Tests Ordered: Current medicines are reviewed at length with the patient today.  Concerns regarding medicines are outlined above.  Orders Placed This Encounter  Procedures   EKG 12-Lead   Medication changes: No orders of the defined types were placed in this encounter.   Signed, Park Liter, MD, [facility] 01/18/2022 2:00 PM    [place]

## 2022-01-18 NOTE — Patient Instructions (Signed)
Medication Instructions:  Your physician recommends that you continue on your current medications as directed. Please refer to the Current Medication list given to you today.  *If you need a refill on your cardiac medications before your next appointment, please call your pharmacy*   Lab Work: None If you have labs (blood work) drawn today and your tests are completely normal, you will receive your results only by: [place] (if you have MyChart) OR A paper copy in the mail If you have any lab test that is abnormal or we need to change your treatment, we will call you to review the results.   Testing/Procedures: None   Follow-Up: At [facility], you and your health needs are our priority.  As part of our continuing mission to provide you with exceptional heart care, we have created designated Provider Care Teams.  These Care Teams include your primary Cardiologist (physician) and Advanced Practice Providers (APPs -  Physician Assistants and Nurse Practitioners) who all work together to provide you with the care you need, when you need it.  We recommend signing up for the patient portal called "MyChart".  Sign up information is provided on this After Visit Summary.  MyChart is used to connect with patients for Virtual Visits (Telemedicine).  Patients are able to view lab/test results, encounter notes, upcoming appointments, etc.  Non-urgent messages can be sent to your provider as well.   To learn more about what you can do with MyChart, go to [email or web address].    Your next appointment:   6 month(s)  The format for your next appointment:   In Person  Provider:   Jenne Campus, MD    Other Instructions None  Important Information About [place]

## 2022-02-07 ENCOUNTER — Other Ambulatory Visit: Payer: Self-pay | Admitting: Cardiology

## 2022-03-20 ENCOUNTER — Other Ambulatory Visit: Payer: Self-pay | Admitting: Oncology

## 2022-03-20 DIAGNOSIS — R0789 Other chest pain: Secondary | ICD-10-CM

## 2022-04-04 ENCOUNTER — Telehealth: Payer: Self-pay

## 2022-04-04 ENCOUNTER — Inpatient Hospital Stay: Payer: Medicare Other | Attending: Oncology | Admitting: Oncology

## 2022-04-04 NOTE — Progress Notes (Shared)
[facility]  [address] [place],  [place]  [address] [phone]  Clinic Day:  04/04/2022  Referring physician: Darrol Jump, PA-C   This document serves as a record of services personally performed by Hosie Poisson, MD. It was created on their behalf by Curry,Lauren E, a trained medical scribe. The creation of this record is based on the scribe's personal observations and the provider's statements to them.  CHIEF COMPLAINT:  CC: History of stage I breast cancer  Current Treatment:  Surveillance   HISTORY OF PRESENT ILLNESS:  Dana [name] is a 61 y.o. female with a history of stage I breast cancer diagnosed in August 1999.  This was a 1.1 cm invasive ductal carcinoma, treated with lumpectomy, CMF chemotherapy, and radiation.  She did not tolerate tamoxifen, but has never had evidence of recurrence.  We began seeing her in August 2004, when she moved to the area.  She is on yearly follow-up, but was last seen in August of 2016.  We did CT scans in April of 2016, and found no evidence of malignancy, but there was a 6 mm nodule in the posterior right upper lobe on the CT chest.  She was seen in July with CT chest to follow up on the lung nodule, and it had resolved.  Due to her personal history of breast cancer diagnosed at age 15, we have recommended genetic testing with the [facility] Hereditary Cancer Panel testing on July 11th, 2016 with [facility] and that result was negative.  She has had a total abdominal hysterectomy for dysfunctional uterine bleeding, but her ovaries are intact.  She states her last colonoscopy was in 2011, and she is due for this again. She had 2 polyps removed at that time, one of which was a tubular adenoma.  She also has atrial fibrillation, history of pneumonia, mild to moderate tricuspid regurgitation, and history of prior stroke in 2014.  I had seen her in October 2017 and we evaluated multiple issues that  she was dealing with.  She is off the Prilosec and now on Protonix 40 mg daily and also on vitamin D2 1.25 mg weekly, in addition to her usual medications.  She had a squamous cell carcinoma removed from her right shoulder, and a squamous cell carcinoma in situ removed from the left anterior chest.  CT imaging from September 2021 revealed stable, benign small pulmonary nodules. No further routine CT follow-up is required for these nodules. Consider ongoing annual low-dose CT lung cancer screening if indicated by patient age, smoking history, and/or other risk factors for lung cancer.   She does have a history of peptic ulcer disease.  She has had some anxiety and depression.  INTERVAL HISTORY:  [name] is here for routine follow up and to review recent imaging results.               CT chest from November 4th revealed a stable exam with no new or progressive findings. The scattered tiny bilateral pulmonary nodules are unchanged in the interval. Stability of these nodules are considered benign with no further routine follow-up indicated.   It has been 8 years since she stopped smoking, so she still is a candidate for annual low dose lung cancer screening.   She continues to have generalized arthralgias and myalgias. Her pain does disturb her sleep despite ibuprofen. She is concerned about taking excessive doses of ibuprofen due to the risk of GI bleed. I will try her  on tramadol 50 mg Q6H prn, but advised that she take this at bedtime.   She recently had routine lab work through [facility], and we will request the results.   Her  appetite is good, and she has lost 4 pounds since her last visit.    She denies fever, chills or other signs of infection.  She denies nausea, vomiting, bowel issues, or abdominal pain.  She denies sore throat, cough, dyspnea, or chest pain.  REVIEW OF SYSTEMS:  Review of Systems  Constitutional: Negative.  Negative for appetite change, chills, fatigue,  fever and unexpected weight change.  HENT:  Negative.    Eyes: Negative.   Respiratory: Negative.  Negative for chest tightness, cough, hemoptysis, shortness of breath and wheezing.   Cardiovascular: Negative.  Negative for chest pain, leg swelling and palpitations.  Gastrointestinal: Negative.  Negative for abdominal distention, abdominal pain, blood in stool, constipation, diarrhea, nausea and vomiting.  Endocrine: Negative.   Genitourinary: Negative.  Negative for difficulty urinating, dysuria, frequency and hematuria.   Musculoskeletal:  Positive for arthralgias (generalized) and myalgias (generalized). Negative for back pain, flank pain and gait problem.  Skin: Negative.   Neurological: Negative.  Negative for dizziness, extremity weakness, gait problem, headaches, light-headedness, numbness, seizures and speech difficulty.  Hematological: Negative.   Psychiatric/Behavioral:  Positive for sleep disturbance (due to generalized pain). Negative for depression. The patient is not nervous/anxious.      VITALS:  There were no vitals taken for this visit.  Wt Readings from Last 3 Encounters:  01/18/22 175 lb 6.4 oz (79.6 kg)  06/21/21 176 lb 9.6 oz (80.1 kg)  04/04/21 175 lb 11.2 oz (79.7 kg)    There is no height or weight on file to calculate BMI.  Performance status (ECOG): 1 - Symptomatic but completely ambulatory  PHYSICAL EXAM:  Physical Exam Constitutional:      General: She is not in acute distress.    Appearance: Normal appearance. She is normal weight.  HENT:     Head: Normocephalic and atraumatic.  Eyes:     General: No scleral icterus.    Extraocular Movements: Extraocular movements intact.     Conjunctiva/sclera: Conjunctivae normal.     Pupils: Pupils are equal, round, and reactive to light.  Cardiovascular:     Rate and Rhythm: Regular rhythm. Bradycardia present.     Pulses: Normal pulses.     Heart sounds: Normal heart sounds. No murmur heard.    No friction  rub. No gallop.  Pulmonary:     Effort: Pulmonary effort is normal. No respiratory distress.     Breath sounds: Normal breath sounds.  Chest:  Breasts:    Right: Normal.     Left: Normal.     Comments: Well healed scar that is barely visible in the upper outer quadrant of the right breast and a well healed scar of the right axilla. Both breasts are without masses. Abdominal:     General: Bowel sounds are normal. There is no distension.     Palpations: Abdomen is soft. There is no hepatomegaly, splenomegaly or mass.     Tenderness: There is no abdominal tenderness.  Musculoskeletal:        General: Normal range of motion.     Cervical back: Normal range of motion and neck supple.     Right lower leg: No edema.     Left lower leg: No edema.  Lymphadenopathy:     Cervical: No cervical adenopathy.  Skin:  General: Skin is warm and dry.  Neurological:     General: No focal deficit present.     Mental Status: She is alert and oriented to person, place, and time. Mental status is at baseline.  Psychiatric:        Mood and Affect: Mood normal.        Behavior: Behavior normal.        Thought Content: Thought content normal.        Judgment: Judgment normal.     LABS:      Latest Ref Rng & Units 04/06/2021   12:00 AM 04/06/2020    3:00 PM 10/27/2017   12:00 AM  CBC  WBC  6.8  8.1  8.8   Hemoglobin 12.0 - 16.0 12.7  11.7  12.5   Hematocrit 36 - 46 38  37.2  37.6   Platelets 150 - 399 188  237  210       Latest Ref Rng & Units 04/06/2021   12:00 AM 12/01/2020    2:12 PM  CMP  Glucose 65 - 99 mg/dL  120   BUN 4 - _0 Creatinine 0.5 - 1.1 0.7  0.79   Sodium 137 - 147 140  142   Potassium 3.4 - 5.3 3.5  3.9   Chloride 99 - 108 102  104   CO2 13 - _1 Calcium 8.7 - 10.7 9.3  9.4   Alkaline Phos 25 - 125 115    AST 13 - 35 20    ALT 7 - 35 16       Lab Results  Component Value Date   TIBC 467 (H) 04/06/2020   FERRITIN 12 04/06/2020   IRONPCTSAT 10  (L) 04/06/2020   Lab Results  Component Value Date   LDH 163 04/06/2020     STUDIES:   EXAM: 03/30/21 CT CHEST WITHOUT CONTRAST  TECHNIQUE: Multidetector CT imaging of the chest was performed following the standard protocol without IV contrast.  COMPARISON: 02/15/2020  FINDINGS: Cardiovascular: The heart size is normal. No substantial pericardial effusion. Coronary artery calcification is evident. Mild atherosclerotic calcification is noted in the wall of the thoracic aorta. Mediastinum/Nodes: No mediastinal lymphadenopathy. No evidence for gross hilar lymphadenopathy although assessment is limited by the lack of intravenous contrast on the current study. Small hiatal hernia. The esophagus has normal imaging features. There is no axillary lymphadenopathy. Surgical clips noted right axilla. Lungs/Pleura: Centrilobular emphsyema noted. Biapical pleuroparenchymal scarring. Stable subpleural scarring right lower lung. 6 mm perifissural nodule right lower lobe seen previously is stable on 43/4 today. Previously described 2 mm posterior left upper lobe nodule (48/4) is unchanged. 3 mm right middle lobe perifissural nodule seen medially on 71/4 is unchanged. There is no new suspicious pulmonary nodule or mass. No focal airspace consolidation. No pleural effusion. Upper Abdomen: Unremarkable Musculoskeletal: No worrisome lytic or sclerotic osseous  abnormality. IMPRESSION: 1. Stable exam. No new or progressive findings. 2. Scattered tiny bilateral pulmonary nodules are unchanged in the interval. Stability, nodules are considered benign with no further routine follow-up indicated. 3. Small hiatal hernia. 4. Aortic Atherosclerosis (ICD10-I70.0) and Emphysema (ICD10-J43.9).  Allergies: No Known Allergies  Current Medications: Current Outpatient Medications  Medication Sig Dispense Refill   apixaban (ELIQUIS) 5 MG TABS tablet Take 1 tablet (5 mg total) by mouth 2 (two) times  daily. 180 tablet 3   atenolol (TENORMIN) 50 MG tablet Take 50 mg by mouth daily.  atorvastatin (LIPITOR) 80 MG tablet Take 1 tablet (80 mg total) by mouth daily. 90 tablet 3   diltiazem (CARDIZEM) 30 MG tablet Take 1 tablet (30 mg total) by mouth every 6 (six) hours as needed (palpitations). 30 tablet 1   hydrOXYzine (ATARAX/VISTARIL) 25 MG tablet Take 12.5-25 mg by mouth at bedtime.     Olopatadine HCl 0.2 % SOLN Place 1 drop into both eyes daily as needed. (Patient taking differently: Place 1 drop into both eyes daily as needed (itching).) 2.5 mL 5   pantoprazole (PROTONIX) 40 MG tablet Take 40 mg by mouth daily.     sertraline (ZOLOFT) 100 MG tablet Take 150 mg by mouth daily.      torsemide (DEMADEX) 10 MG tablet Take 1 tablet (10 mg total) by mouth daily. 90 tablet 2   traMADol (ULTRAM) 50 MG tablet Take 1 tablet (50 mg total) by mouth every 6 (six) hours as needed for moderate pain. 30 tablet 3   No current facility-administered medications for this visit.     ASSESSMENT & PLAN:   Assessment:   1.  Stage I breast cancer diagnosed in August 1999.  She remains without evidence of recurrence.  2.  Anemia, first appreciated in August 2021.  Iron studies were consistent with deficiency with a saturation of 14% at that time.  Currently her anemia has resolved, and iron studies are within a good range.  3.  Former smoker with COPD. It has been 8 years since she quit smoking.  We will continue annual low dose lung cancer screening CT scans.  4. Generalized arthralgias and myalgias, which disturbs her sleep. I suspect she has fibromyalgia. This persists despite ibuprofen, and the patient is concerned about the associated risk of GI bleed with excessive dosing. I will try her on tramadol 50 mg Q6H prn, and advised that she take this at night.  Plan: CT chest from November remains stable, and we will continue with annual lung cancer screening. In view of her generalized pain despite  ibuprofen, I will try her on tramadol 50 mg QH prn, and advised that she take this at bedtime. We will request her recent lab work from her primary care office. Otherwise, I will see her back in 1 year with low dose CT chest for lung cancer screening for repeat evaluation.  She understands and agrees with this plan of care.   I provided 20 minutes of face-to-face time during this this encounter and > 50% was spent counseling as documented under my assessment and plan.    Derwood Kaplan, MD [facility] [address] [place] [address] Dept: [phone] Dept Fax: [phone]    Ninetta Lights as a scribe for Derwood Kaplan, MD.,have documented all relevant documentation on the behalf of Derwood Kaplan, MD,as directed by  Derwood Kaplan, MD while in the presence of Derwood Kaplan, MD.

## 2022-04-04 NOTE — Telephone Encounter (Signed)
CT 10/02/21 printed. Mammo not scheduled. Also last mammo 04/2021.

## 2022-04-04 NOTE — Telephone Encounter (Signed)
-----   Message from Derwood Kaplan, MD sent at 04/04/2022  1:25 PM EST ----- Regarding: CT, Mammo Pls see if she had CT and/or mammo

## 2022-05-02 ENCOUNTER — Other Ambulatory Visit: Payer: Self-pay | Admitting: Oncology

## 2022-05-02 ENCOUNTER — Encounter: Payer: Self-pay | Admitting: Oncology

## 2022-05-02 ENCOUNTER — Inpatient Hospital Stay: Payer: Medicare Other | Attending: Oncology | Admitting: Oncology

## 2022-05-02 VITALS — BP 174/75 | HR 55 | Temp 98.3°F | Resp 18 | Ht 68.0 in | Wt 175.2 lb

## 2022-05-02 DIAGNOSIS — Z171 Estrogen receptor negative status [ER-]: Secondary | ICD-10-CM

## 2022-05-02 DIAGNOSIS — Z87891 Personal history of nicotine dependence: Secondary | ICD-10-CM

## 2022-05-02 DIAGNOSIS — C50111 Malignant neoplasm of central portion of right female breast: Secondary | ICD-10-CM | POA: Diagnosis not present

## 2022-05-02 DIAGNOSIS — Z1231 Encounter for screening mammogram for malignant neoplasm of breast: Secondary | ICD-10-CM

## 2022-05-02 DIAGNOSIS — R918 Other nonspecific abnormal finding of lung field: Secondary | ICD-10-CM

## 2022-05-02 NOTE — Progress Notes (Signed)
[facility]  [address] [place],  [place]  [address] [phone]  Clinic Day: May 02, 2022  Referring physician: Darrol Jump, PA-C    CHIEF COMPLAINT:  CC: History of stage I breast cancer  Current Treatment:  Surveillance   HISTORY OF PRESENT ILLNESS:  Dana [name] is a 61 y.o. female with a history of stage I breast cancer diagnosed in August 1999.  This was a 1.1 cm invasive ductal carcinoma, treated with lumpectomy, CMF chemotherapy, and radiation.  She did not tolerate tamoxifen, but has never had evidence of recurrence.  We began seeing her in August 2004, when she moved to the area.  She is on yearly follow-up, but was last seen in August of 2016.  We did CT scans in April of 2016, and found no evidence of malignancy, but there was a 6 mm nodule in the posterior right upper lobe on the CT chest.  She was seen in July with CT chest to follow up on the lung nodule, and it had resolved.  Due to her personal history of breast cancer diagnosed at age 48, we have recommended genetic testing with the [facility] Hereditary Cancer Panel testing on July 11th, 2016 with [place] and that result was negative.  She has had a total abdominal hysterectomy for dysfunctional uterine bleeding, but her ovaries are intact.  She states her last colonoscopy was in 2011, and she is due for this again. She had 2 polyps removed at that time, one of which was a tubular adenoma.  She also has atrial fibrillation, history of pneumonia, mild to moderate tricuspid regurgitation, and history of prior stroke in 2014.  I had seen her in October 2017 and we evaluated multiple issues that she was dealing with.  She is off the Prilosec and now on Protonix 40 mg daily and also on vitamin D2 1.25 mg weekly, in addition to her usual medications.  She had a squamous cell carcinoma removed from her right shoulder, and a squamous cell carcinoma in situ removed from  the left anterior chest.  CT imaging from September 2021 revealed stable, benign small pulmonary nodules.  She is on annual low-dose CT lung cancer screening due to his smoking history of 1 pack/day for 35 years.  She did quit 9 years ago.   She does have a history of peptic ulcer disease.  She has had some anxiety and depression.  INTERVAL HISTORY:  [name] is here for routine follow up. She states she is doing well. She states she feels something on the left axilla which comes and goes, describing that it hurts in that area. This is consistent with hidradenitis. She will be due for her annual mammogram this month. She is also due for annual low dose lung cancer screening CT chest scan. It has been 9 years since she stopped smoking after 35 pack years. She states she has swelling in her right ankle and left knee that comes and goes. Last night she noticed her left forearm was swollen but today it has gone away. She continues to have generalized arthralgias and myalgias. Her  appetite is good, and her weight has remained stable since her last visit. She denies fever, chills or other signs of infection.  She denies nausea, vomiting, bowel issues, or abdominal pain.  She denies sore throat, cough, dyspnea, or chest pain.  REVIEW OF SYSTEMS:  Review of Systems  Constitutional: Negative.  Negative for appetite change, chills, fatigue,  fever and unexpected weight change.  HENT:  Negative.    Eyes: Negative.   Respiratory: Negative.  Negative for chest tightness, cough, hemoptysis, shortness of breath and wheezing.   Cardiovascular: Negative.  Negative for chest pain, leg swelling and palpitations.  Gastrointestinal: Negative.  Negative for abdominal distention, abdominal pain, blood in stool, constipation, diarrhea, nausea and vomiting.  Endocrine: Negative.   Genitourinary: Negative.  Negative for difficulty urinating, dysuria, frequency and hematuria.   Musculoskeletal:  Positive for arthralgias  (generalized) and myalgias (generalized). Negative for back pain, flank pain and gait problem.  Skin: Negative.   Neurological: Negative.  Negative for dizziness, extremity weakness, gait problem, headaches, light-headedness, numbness, seizures and speech difficulty.  Hematological: Negative.   Psychiatric/Behavioral:  Positive for sleep disturbance (due to generalized pain). Negative for depression. The patient is not nervous/anxious.      VITALS:  Blood pressure (!) 174/75, pulse (!) 55, temperature 98.3 F (36.8 C), temperature source Oral, resp. rate 18, height _0  (1.727 m), weight 175 lb 3.2 oz (79.5 kg), SpO2 97 %.  Wt Readings from Last 3 Encounters:  05/02/22 175 lb 3.2 oz (79.5 kg)  01/18/22 175 lb 6.4 oz (79.6 kg)  06/21/21 176 lb 9.6 oz (80.1 kg)    Body mass index is 26.64 kg/m.  Performance status (ECOG): 1 - Symptomatic but completely ambulatory  PHYSICAL EXAM:  Physical Exam Constitutional:      General: She is not in acute distress.    Appearance: Normal appearance. She is normal weight. She is not ill-appearing.  HENT:     Head: Normocephalic and atraumatic.     Nose: Nose normal.     Mouth/Throat:     Mouth: Mucous membranes are moist.     Pharynx: Oropharynx is clear.  Eyes:     General: No scleral icterus.    Extraocular Movements: Extraocular movements intact.     Conjunctiva/sclera: Conjunctivae normal.     Pupils: Pupils are equal, round, and reactive to light.  Cardiovascular:     Rate and Rhythm: Regular rhythm. Bradycardia present.     Pulses: Normal pulses.     Heart sounds: Normal heart sounds. No murmur heard.    No gallop.  Pulmonary:     Effort: Pulmonary effort is normal. No respiratory distress.     Breath sounds: Normal breath sounds.     Comments: Scars on her back from moles being removed, the upper right back. Chest:  Breasts:    Right: Normal.     Left: Normal.     Comments: Scar in the right breast at 3 o'clock which is barely  visible. Scar in the right axilla which is healed. Faint scars in the upper left axilla consistent with healed hidradenitis.  Abdominal:     General: Bowel sounds are normal. There is no distension.     Palpations: Abdomen is soft. There is no hepatomegaly, splenomegaly or mass.     Tenderness: There is no abdominal tenderness.  Musculoskeletal:        General: Normal range of motion.     Cervical back: Normal range of motion and neck supple.     Right lower leg: No edema.     Left lower leg: No edema.  Lymphadenopathy:     Cervical: No cervical adenopathy.  Skin:    General: Skin is warm and dry.  Neurological:     General: No focal deficit present.     Mental Status: She is alert and oriented to  person, place, and time. Mental status is at baseline.  Psychiatric:        Mood and Affect: Mood normal.        Behavior: Behavior normal.        Thought Content: Thought content normal.        Judgment: Judgment normal.     LABS:      Latest Ref Rng & Units 04/06/2021   12:00 AM 04/06/2020    3:00 PM 10/27/2017   12:00 AM  CBC  WBC  6.8  8.1  8.8   Hemoglobin 12.0 - 16.0 12.7  11.7  12.5   Hematocrit 36 - 46 38  37.2  37.6   Platelets 150 - 399 188  237  210       Latest Ref Rng & Units 04/06/2021   12:00 AM 12/01/2020    2:12 PM  CMP  Glucose 65 - 99 mg/dL  120   BUN 4 - _0 Creatinine 0.5 - 1.1 0.7  0.79   Sodium 137 - 147 140  142   Potassium 3.4 - 5.3 3.5  3.9   Chloride 99 - 108 102  104   CO2 13 - _1 Calcium 8.7 - 10.7 9.3  9.4   Alkaline Phos 25 - 125 115    AST 13 - 35 20    ALT 7 - 35 16       Lab Results  Component Value Date   TIBC 467 (H) 04/06/2020   FERRITIN 12 04/06/2020   IRONPCTSAT 10 (L) 04/06/2020   Lab Results  Component Value Date   LDH 163 04/06/2020     STUDIES:   EXAM: 03/30/21 CT CHEST WITHOUT CONTRAST  TECHNIQUE: Multidetector CT imaging of the chest was performed following the standard protocol without IV  contrast.  COMPARISON: 02/15/2020  FINDINGS: Cardiovascular: The heart size is normal. No substantial pericardial effusion. Coronary artery calcification is evident. Mild atherosclerotic calcification is noted in the wall of the thoracic aorta. Mediastinum/Nodes: No mediastinal lymphadenopathy. No evidence for gross hilar lymphadenopathy although assessment is limited by the lack of intravenous contrast on the current study. Small hiatal hernia. The esophagus has normal imaging features. There is no axillary lymphadenopathy. Surgical clips noted right axilla. Lungs/Pleura: Centrilobular emphsyema noted. Biapical pleuroparenchymal scarring. Stable subpleural scarring right lower lung. 6 mm perifissural nodule right lower lobe seen previously is stable on 43/4 today. Previously described 2 mm posterior left upper lobe nodule (48/4) is unchanged. 3 mm right middle lobe perifissural nodule seen medially on 71/4 is unchanged. There is no new suspicious pulmonary nodule or mass. No focal airspace consolidation. No pleural effusion. Upper Abdomen: Unremarkable Musculoskeletal: No worrisome lytic or sclerotic osseous  abnormality. IMPRESSION: 1. Stable exam. No new or progressive findings. 2. Scattered tiny bilateral pulmonary nodules are unchanged in the interval. Stability, nodules are considered benign with no further routine follow-up indicated. 3. Small hiatal hernia. 4. Aortic Atherosclerosis (ICD10-I70.0) and Emphysema (ICD10-J43.9).  Allergies: No Known Allergies  Current Medications: Current Outpatient Medications  Medication Sig Dispense Refill   montelukast (SINGULAIR) 10 MG tablet Take 10 mg by mouth daily.     traZODone (DESYREL) 50 MG tablet Take by mouth.     TRELEGY ELLIPTA 100-62.5-25 MCG/ACT AEPB Take 1 puff by mouth daily.     apixaban (ELIQUIS) 5 MG TABS tablet Take 1 tablet (5 mg total) by mouth 2 (two) times daily. 180 tablet 3  atenolol (TENORMIN) 50 MG  tablet Take 50 mg by mouth daily.     atorvastatin (LIPITOR) 80 MG tablet Take 1 tablet (80 mg total) by mouth daily. 90 tablet 3   diltiazem (CARDIZEM) 30 MG tablet Take 1 tablet (30 mg total) by mouth every 6 (six) hours as needed (palpitations). 30 tablet 1   hydrOXYzine (ATARAX/VISTARIL) 25 MG tablet Take 12.5-25 mg by mouth at bedtime.     Olopatadine HCl 0.2 % SOLN Place 1 drop into both eyes daily as needed. (Patient taking differently: Place 1 drop into both eyes daily as needed (itching).) 2.5 mL 5   pantoprazole (PROTONIX) 40 MG tablet Take 40 mg by mouth daily.     sertraline (ZOLOFT) 100 MG tablet Take 150 mg by mouth daily.      torsemide (DEMADEX) 10 MG tablet Take 1 tablet (10 mg total) by mouth daily. 90 tablet 2   traMADol (ULTRAM) 50 MG tablet Take 1 tablet (50 mg total) by mouth every 6 (six) hours as needed for moderate pain. 30 tablet 3   No current facility-administered medications for this visit.     ASSESSMENT & PLAN:   Assessment:   1.  Stage I breast cancer diagnosed in August 1999.  She remains without evidence of recurrence.  2.  Former smoker with COPD. It has been 9 years since she quit smoking after 35 pack years.  We will continue annual low dose lung cancer screening CT scans until it has been 15 years.  3.  Mild fibrocystic changes of the left breast. I think the pain she feels may be aggravated by increased caffeine.  4.  Hidradenitis. Right now all I see is scarring but this comes and goes.   Plan: We will schedule her annual mammogram later this month along with low dose lung cancer screening CT scan of the chest. We will call her on those results, otherwise, I will see her back in 1 year with the bilateral screening mammogram and low dose lung cancer screening CT of chest. She understands and agrees with this plan of care.   I provided 20 minutes of face-to-face time during this this encounter and > 50% was spent counseling as documented under my  assessment and plan.    Derwood Kaplan, MD [facility] [address] [place] [address] Dept: [phone] Dept Fax: [phone]    Ninetta Lights as a scribe for Derwood Kaplan, MD.,have documented all relevant documentation on the behalf of Derwood Kaplan, MD,as directed by  Derwood Kaplan, MD while in the presence of Derwood Kaplan, MD.

## 2022-05-09 ENCOUNTER — Encounter: Payer: Self-pay | Admitting: Oncology

## 2022-05-11 IMAGING — CT CT HEART MORP W/ CTA COR W/ SCORE W/ CA W/CM &/OR W/O CM
4 of 7 series · 8 of 20 positions shown, 9 images · non-contrast
Comparison: 02/15/2020 from [REDACTED]
COMPARISON: 02/15/2020 from [REDACTED]

Addendum:
EXAM:
OVER-READ INTERPRETATION  CT CHEST

The following report is an over-read performed by radiologist Dr.
Friskydon Bacchas [REDACTED] on 12/05/2020. This over-read
does not include interpretation of cardiac or coronary anatomy or
pathology. The coronary CTA interpretation by the cardiologist is
attached.
CLINICAL DATA: This is a 59 year old female with chest pain.
Cardiac/Coronary  CTA
TECHNIQUE: The patient was scanned on a [employer] Force scanner.

[Series 6: best diast 81 % · axial · 0.39mm/px · z∈[-147,-101]mm · 2 of 343 slices shown]
[im 115/343  vessel]
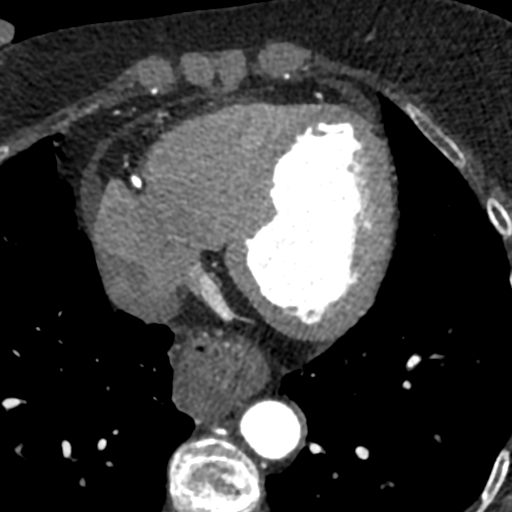
[im 229/343  vessel]
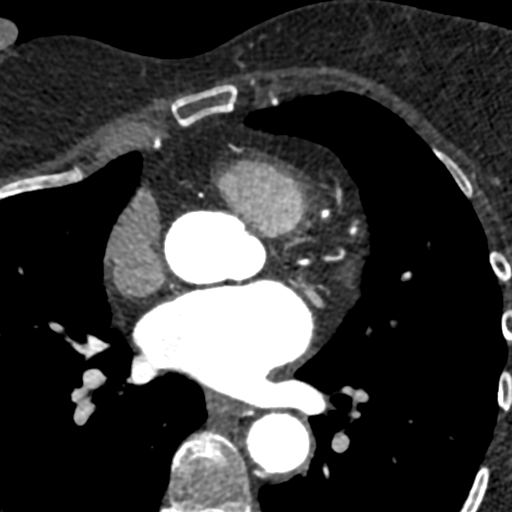

[Series 8: best syst · axial · 0.39mm/px · z∈[-147,-101]mm · 2 of 343 slices shown, 3 images]
[im 115/343  vessel]
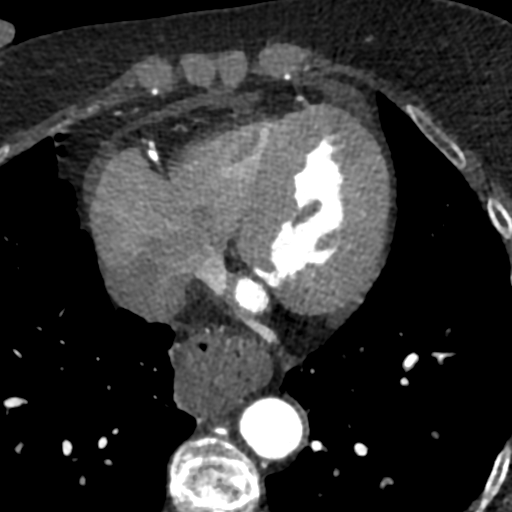
[im 115/343  lung]
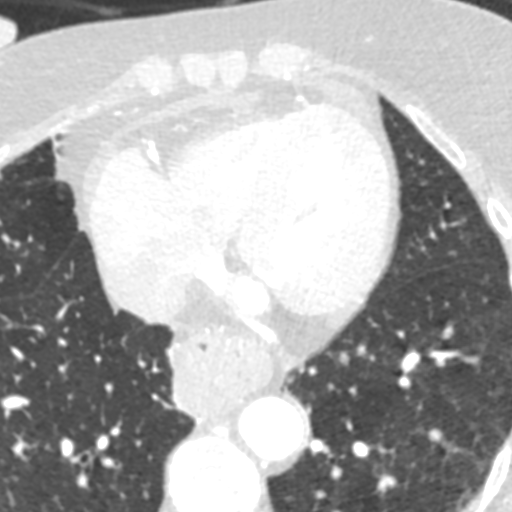
[im 229/343  vessel]
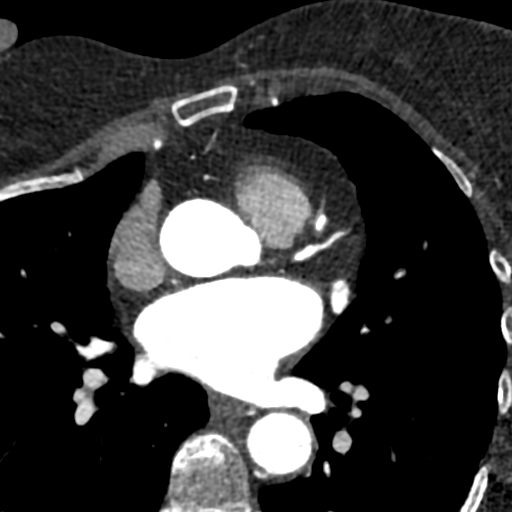

[Series 9: ts diast sharp 81 % · axial · 0.39mm/px · z∈[-147,-101]mm · 2 of 343 slices shown]
[im 115/343  lung]
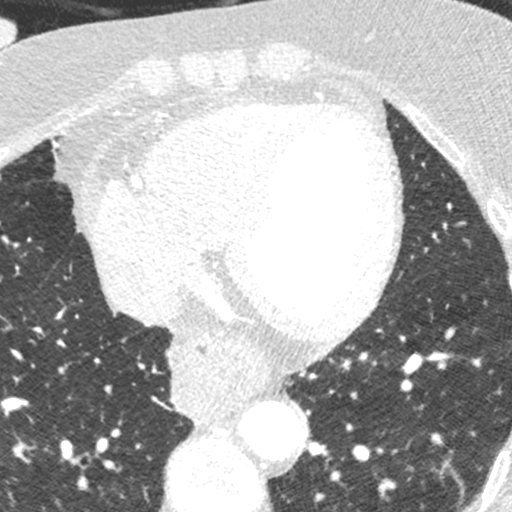
[im 229/343  lung]
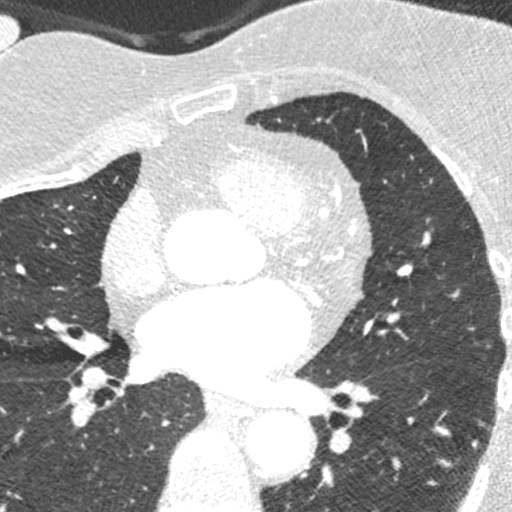

[Series 10: ts syst sharp · axial · 0.39mm/px · z∈[-147,-101]mm · 2 of 343 slices shown]
[im 115/343  lung]
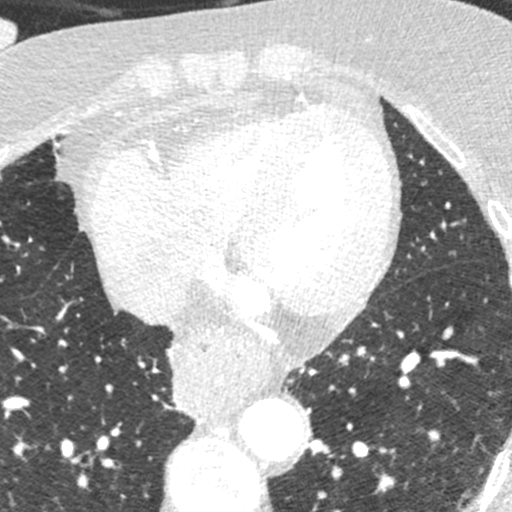
[im 229/343  lung]
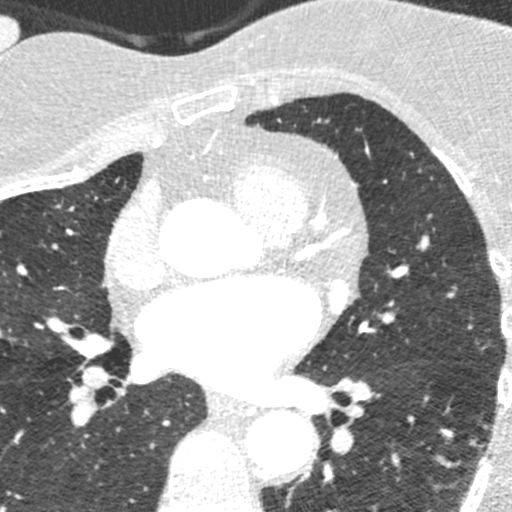

[8 of 20 positions shown; findings below may reference images not displayed]

FINDINGS: Vascular: Aortic atherosclerosis. No central pulmonary embolism, on
this non-dedicated study.

Mediastinum/Nodes: No imaged thoracic adenopathy. Moderate hiatal
hernia.

Lungs/Pleura: No pleural fluid.  Clear imaged lungs.

Upper Abdomen: Normal imaged portions of the liver, spleen.

Musculoskeletal: No acute osseous abnormality.
IMPRESSION: 1. No acute findings in the imaged extracardiac chest.
2. Moderate hiatal hernia.
3. Aortic Atherosclerosis (J1J2F-WG2.2).
FINDINGS: A 100 kV prospective scan was triggered in the descending thoracic
aorta at 111 HU's. Axial non-contrast 3 mm slices were carried out
through the heart. The data set was analyzed on a dedicated work
station and scored using the Agatson method. Gantry rotation speed
was 250 msecs and collimation was .6 mm. No beta blockade and 0.8 mg
of sl NTG was given. The 3D data set was reconstructed in 5%
intervals of the 67-82 % of the R-R cycle. Diastolic phases were
analyzed on a dedicated work station using MPR, MIP and VRT modes.
The patient received 80 cc of contrast.

Aorta:  Normal size.  No calcifications.  No dissection.

Aortic Valve:  Trileaflet.  No calcifications.

Coronary Arteries:  Normal coronary origin.  Right dominance.

RCA is a large dominant artery that gives rise to PDA and PLA. There
is a minimal (<24%) calcified plaque in the proximal RCA. The mid
and distal RCA with no plaques.

Left main is a large artery that gives rise to LAD, Intermedius
Ramus and LCX arteries.

LAD is a large vessel. There is minimal calcified plaque in the
proximal LAD. The mid and distal LAD with no plaques. D1 is a small
caliber vessel with no plaques.

Ramus is a medium sized vessel with no plaques.

LCX is a non-dominant artery that gives rise to one large OM1
branch. There is no plaque.

Other findings:

Normal pulmonary vein drainage into the left atrium.

Normal left atrial appendage without a thrombus.

Normal size of the pulmonary artery.
IMPRESSION: 1. Coronary calcium score of 54.6. This was 83 percentile for age
and sex matched control.

2. Normal coronary origin with right dominance.

3. Minimal CAD. CAD-RADS 1. Recommend Aggressive medical therapy for
primary prevention.

*** End of Addendum ***
EXAM:
OVER-READ INTERPRETATION  CT CHEST

The following report is an over-read performed by radiologist Dr.
Akihiru [REDACTED] on 12/05/2020. This over-read
does not include interpretation of cardiac or coronary anatomy or
pathology. The coronary CTA interpretation by the cardiologist is
attached.
FINDINGS: Vascular: Aortic atherosclerosis. No central pulmonary embolism, on
this non-dedicated study.

Mediastinum/Nodes: No imaged thoracic adenopathy. Moderate hiatal
hernia.

Lungs/Pleura: No pleural fluid.  Clear imaged lungs.

Upper Abdomen: Normal imaged portions of the liver, spleen.

Musculoskeletal: No acute osseous abnormality.
IMPRESSION: 1. No acute findings in the imaged extracardiac chest.
2. Moderate hiatal hernia.
3. Aortic Atherosclerosis (J1J2F-WG2.2).

## 2022-05-28 ENCOUNTER — Telehealth: Payer: Self-pay | Admitting: *Deleted

## 2022-05-28 ENCOUNTER — Telehealth: Payer: Self-pay | Admitting: Oncology

## 2022-05-28 NOTE — Telephone Encounter (Signed)
05/28/22 Spoke with patient and she stated to reschedule ct chest.Did not want to schedule mammogram.

## 2022-05-28 NOTE — Telephone Encounter (Signed)
05/28/22 [facility] scheduling attempted to reschedule ct chest-(patient was a no show x2)Patient refused to reschedule.

## 2022-06-05 ENCOUNTER — Encounter: Payer: Self-pay | Admitting: Oncology

## 2022-06-05 ENCOUNTER — Other Ambulatory Visit: Payer: Self-pay

## 2022-06-05 ENCOUNTER — Other Ambulatory Visit: Payer: Self-pay | Admitting: Oncology

## 2022-06-05 DIAGNOSIS — R911 Solitary pulmonary nodule: Secondary | ICD-10-CM | POA: Insufficient documentation

## 2022-06-05 DIAGNOSIS — C342 Malignant neoplasm of middle lobe, bronchus or lung: Secondary | ICD-10-CM

## 2022-06-13 ENCOUNTER — Other Ambulatory Visit: Payer: Self-pay | Admitting: Oncology

## 2022-06-13 ENCOUNTER — Encounter: Payer: Self-pay | Admitting: Oncology

## 2022-06-13 ENCOUNTER — Inpatient Hospital Stay: Payer: Medicare Other

## 2022-06-13 ENCOUNTER — Inpatient Hospital Stay: Payer: Medicare Other | Attending: Oncology | Admitting: Oncology

## 2022-06-13 VITALS — BP 151/73 | HR 58 | Temp 98.3°F | Resp 16 | Ht 68.0 in | Wt 171.3 lb

## 2022-06-13 DIAGNOSIS — J449 Chronic obstructive pulmonary disease, unspecified: Secondary | ICD-10-CM | POA: Insufficient documentation

## 2022-06-13 DIAGNOSIS — Z923 Personal history of irradiation: Secondary | ICD-10-CM | POA: Insufficient documentation

## 2022-06-13 DIAGNOSIS — R911 Solitary pulmonary nodule: Secondary | ICD-10-CM | POA: Diagnosis not present

## 2022-06-13 DIAGNOSIS — Z9221 Personal history of antineoplastic chemotherapy: Secondary | ICD-10-CM | POA: Insufficient documentation

## 2022-06-13 DIAGNOSIS — Z8673 Personal history of transient ischemic attack (TIA), and cerebral infarction without residual deficits: Secondary | ICD-10-CM | POA: Insufficient documentation

## 2022-06-13 DIAGNOSIS — F418 Other specified anxiety disorders: Secondary | ICD-10-CM | POA: Insufficient documentation

## 2022-06-13 DIAGNOSIS — C342 Malignant neoplasm of middle lobe, bronchus or lung: Secondary | ICD-10-CM

## 2022-06-13 DIAGNOSIS — Z87891 Personal history of nicotine dependence: Secondary | ICD-10-CM | POA: Insufficient documentation

## 2022-06-13 DIAGNOSIS — R0789 Other chest pain: Secondary | ICD-10-CM

## 2022-06-13 DIAGNOSIS — M159 Polyosteoarthritis, unspecified: Secondary | ICD-10-CM

## 2022-06-13 DIAGNOSIS — Z853 Personal history of malignant neoplasm of breast: Secondary | ICD-10-CM | POA: Insufficient documentation

## 2022-06-13 MED ORDER — TRAMADOL HCL 50 MG PO TABS: 50.0000 mg | ORAL_TABLET | Freq: Four times a day (QID) | ORAL | 3 refills | Status: DC | PRN

## 2022-06-13 NOTE — Progress Notes (Signed)
[facility]  [address] [place],  [place]  [address] [phone]  Clinic Day: 06/13/22   Referring physician: Gus Height, PA-C    CHIEF COMPLAINT:  CC: History of stage I breast cancer  Current Treatment:  Surveillance   HISTORY OF PRESENT ILLNESS:  Dana [name] is a 62 y.o. female with a history of stage I breast cancer diagnosed in August 1999.  This was a 1.1 cm invasive ductal carcinoma, treated with lumpectomy, CMF chemotherapy, and radiation.  She did not tolerate tamoxifen, but has never had evidence of recurrence.  We began seeing her in August 2004, when she moved to the area.  She is on yearly follow-up, but was last seen in August of 2016.  We did CT scans in April of 2016, and found no evidence of malignancy, but there was a 6 mm nodule in the posterior right upper lobe on the CT chest.  She was seen in July with CT chest to follow up on the lung nodule, and it had resolved.  Due to her personal history of breast cancer diagnosed at age 50, we have recommended genetic testing with the [facility] Hereditary Cancer Panel testing on July 11th, 2016 with [facility] and that result was negative.  She has had a total abdominal hysterectomy for dysfunctional uterine bleeding, but her ovaries are intact.  She states her last colonoscopy was in 2011, and she is due for this again. She had 2 polyps removed at that time, one of which was a tubular adenoma.  She also has atrial fibrillation, history of pneumonia, mild to moderate tricuspid regurgitation, and history of prior stroke in 2014.  I had seen her in October 2017 and we evaluated multiple issues that she was dealing with.  She is off the Prilosec and now on Protonix 40 mg daily and also on vitamin D2 1.25 mg weekly, in addition to her usual medications.  She had a squamous cell carcinoma removed from her right shoulder, and a squamous cell carcinoma in situ removed from the  left anterior chest.  CT imaging from September 2021 revealed stable, benign small pulmonary nodules.  She is on annual low-dose CT lung cancer screening due to his smoking history of 1 pack/day for 35 years.  She did quit 9 years ago.   She does have a history of peptic ulcer disease.  She has had some anxiety and depression.  INTERVAL HISTORY:  Dana [name] is here today to discuss recent PET scan and CT lung cancer screening. The lung cancer screening CT scan from 06/03/22 reveals a new large irregular subpleural solid pulmonary nodule of the right middle lobe measuring 15.9 mm in mean diameter, for a lung RADS 4B, suspicious.  She then had a PET scan on 06/12/22 which reveals a hypermetabolic RIGHT upper lobe pulmonary nodule most consistent with bronchogenic carcinoma. No evidence of metastatic adenopathy or distant metastatic disease. I have recommended that we resect this with surgery.  It is possible but unlikely that this represents a metastasis from her breast cancer since that was nearly 25 years ago.  We do have to refer her to a chest surgeon, along with a pulmonologist to schedule a bronchoscopy. She does have an appointment with Dr.Icard next week, hopefully she will be able to start scheduling these appointments soon. She notes that she has been having some back pain when she lays down. She does also complain of her left knee and right ankle hurting and  is requesting getting an xray for this along with her upper back.  I will get that scheduled.  She did not get her annual mammogram, so we will schedule one for her soon. She has requested to refill a larger amount of the tramadol 50 mg. Her  appetite is good, and her weight has remained stable since her last visit. She denies fever, chills or other signs of infection.  She denies nausea, vomiting, bowel issues, or abdominal pain.  She denies sore throat, cough, dyspnea, or chest pain.  REVIEW OF SYSTEMS:  Review of Systems  Constitutional: Negative.   Negative for appetite change, chills, fatigue, fever and unexpected weight change.  HENT:  Negative.    Eyes: Negative.   Respiratory: Negative.  Negative for chest tightness, cough, hemoptysis, shortness of breath and wheezing.   Cardiovascular: Negative.  Negative for chest pain, leg swelling and palpitations.  Gastrointestinal: Negative.  Negative for abdominal distention, abdominal pain, blood in stool, constipation, diarrhea, nausea and vomiting.  Endocrine: Negative.   Genitourinary: Negative.  Negative for difficulty urinating, dysuria, frequency and hematuria.   Musculoskeletal:  Positive for arthralgias (generalized) and myalgias (generalized). Negative for back pain, flank pain and gait problem.  Skin: Negative.   Neurological: Negative.  Negative for dizziness, extremity weakness, gait problem, headaches, light-headedness, numbness, seizures and speech difficulty.  Hematological: Negative.   Psychiatric/Behavioral:  Positive for sleep disturbance (due to generalized pain). Negative for depression. The patient is not nervous/anxious.      VITALS:  Blood pressure (!) 151/73, pulse (!) 58, temperature 98.3 F (36.8 C), temperature source Oral, resp. rate 16, height 5\' 8"  (1.727 m), weight 171 lb 4.8 oz (77.7 kg), SpO2 98 %.  Wt Readings from Last 3 Encounters:  06/13/22 171 lb 4.8 oz (77.7 kg)  05/02/22 175 lb 3.2 oz (79.5 kg)  01/18/22 175 lb 6.4 oz (79.6 kg)    Body mass index is 26.05 kg/m.  Performance status (ECOG): 1 - Symptomatic but completely ambulatory  PHYSICAL EXAM:  Physical Exam Constitutional:      General: She is not in acute distress.    Appearance: Normal appearance. She is normal weight. She is not ill-appearing.  HENT:     Head: Normocephalic and atraumatic.     Nose: Nose normal.     Mouth/Throat:     Mouth: Mucous membranes are moist.     Pharynx: Oropharynx is clear.  Eyes:     General: No scleral icterus.    Extraocular Movements: Extraocular  movements intact.     Conjunctiva/sclera: Conjunctivae normal.     Pupils: Pupils are equal, round, and reactive to light.  Cardiovascular:     Rate and Rhythm: Regular rhythm.     Pulses: Normal pulses.     Heart sounds: Normal heart sounds. No murmur heard.    No gallop.  Pulmonary:     Effort: Pulmonary effort is normal. No respiratory distress.     Breath sounds: Normal breath sounds.     Comments: Scars on her back from moles being removed, the upper right back. Chest:  Breasts:    Right: Normal.     Left: Normal.     Comments: Scar in the right breast at 3 o'clock which is barely visible. Scar in the right axilla which is healed. Faint scars in the upper left axilla consistent with healed hidradenitis.  Abdominal:     General: Bowel sounds are normal. There is no distension.     Palpations: Abdomen is  soft. There is no hepatomegaly, splenomegaly or mass.     Tenderness: There is no abdominal tenderness.  Musculoskeletal:        General: Normal range of motion.     Cervical back: Normal range of motion and neck supple.     Right lower leg: No edema.     Left lower leg: No edema.  Lymphadenopathy:     Cervical: No cervical adenopathy.  Skin:    General: Skin is warm and dry.  Neurological:     General: No focal deficit present.     Mental Status: She is alert and oriented to person, place, and time. Mental status is at baseline.  Psychiatric:        Mood and Affect: Mood normal.        Behavior: Behavior normal.        Thought Content: Thought content normal.        Judgment: Judgment normal.     LABS:      Latest Ref Rng & Units 04/06/2021   12:00 AM 04/06/2020    3:00 PM 10/27/2017   12:00 AM  CBC  WBC  6.8  8.1  8.8   Hemoglobin 12.0 - 16.0 12.7  11.7  12.5   Hematocrit 36 - 46 38  37.2  37.6   Platelets 150 - 399 188  237  210       Latest Ref Rng & Units 04/06/2021   12:00 AM 12/01/2020    2:12 PM  CMP  Glucose 65 - 99 mg/dL  566   BUN 4 - 21 12  14     Creatinine 0.5 - 1.1 0.7  0.79   Sodium 137 - 147 140  142   Potassium 3.4 - 5.3 3.5  3.9   Chloride 99 - 108 102  104   CO2 13 - 22 24  22    Calcium 8.7 - 10.7 9.3  9.4   Alkaline Phos 25 - 125 115    AST 13 - 35 20    ALT 7 - 35 16       Lab Results  Component Value Date   TIBC 467 (H) 04/06/2020   FERRITIN 12 04/06/2020   IRONPCTSAT 10 (L) 04/06/2020   Lab Results  Component Value Date   LDH 163 04/06/2020     STUDIES:  EXAM:06/12/22 PET SCAN IMPRESSION: Hypermetabolic RIGHT upper lobe pulmonary nodule most consistent with bronchogenic carcinoma. No evidence of metastatic adenopathy or distant metastatic disease.   EXAM:06/03/22 CT CHEST WITHOUT CONTRAST LOW DOSE FOR LUNG CANCER SCREENING IMPRESSION: New large irregular subpleural solid pulmonary nodule of the right middle lobe measuring 15.9 mm in mean diameter. Lung RADS 4B. Suspicious. Additional imaging evaluation or consultation with Pulmonology or Thoracic Surgery recommended.\\ Aortic Atherosclerosis and Emphysema.  EXAM: 03/30/21 CT CHEST WITHOUT CONTRAST  TECHNIQUE: Multidetector CT imaging of the chest was performed following the standard protocol without IV contrast.  COMPARISON: 02/15/2020  FINDINGS: Cardiovascular: The heart size is normal. No substantial pericardial effusion. Coronary artery calcification is evident. Mild atherosclerotic calcification is noted in the wall of the thoracic aorta. Mediastinum/Nodes: No mediastinal lymphadenopathy. No evidence for gross hilar lymphadenopathy although assessment is limited by the lack of intravenous contrast on the current study. Small hiatal hernia. The esophagus has normal imaging features. There is no axillary lymphadenopathy. Surgical clips noted right axilla. Lungs/Pleura: Centrilobular emphsyema noted. Biapical pleuroparenchymal scarring. Stable subpleural scarring right lower lung. 6 mm perifissural nodule right lower lobe seen previously  is stable on 43/4 today. Previously described 2 mm posterior left upper lobe nodule (48/4) is unchanged. 3 mm right middle lobe perifissural nodule seen medially on 71/4 is unchanged. There is no new suspicious pulmonary nodule or mass. No focal airspace consolidation. No pleural effusion. Upper Abdomen: Unremarkable Musculoskeletal: No worrisome lytic or sclerotic osseous  abnormality. IMPRESSION: 1. Stable exam. No new or progressive findings. 2. Scattered tiny bilateral pulmonary nodules are unchanged in the interval. Stability, nodules are considered benign with no further routine follow-up indicated. 3. Small hiatal hernia. 4. Aortic Atherosclerosis (ICD10-I70.0) and Emphysema (ICD10-J43.9).  Allergies: No Known Allergies  Current Medications: Current Outpatient Medications  Medication Sig Dispense Refill   apixaban (ELIQUIS) 5 MG TABS tablet Take 1 tablet (5 mg total) by mouth 2 (two) times daily. 180 tablet 3   atenolol (TENORMIN) 50 MG tablet Take 50 mg by mouth daily.     atorvastatin (LIPITOR) 80 MG tablet Take 1 tablet (80 mg total) by mouth daily. 90 tablet 3   diltiazem (CARDIZEM) 30 MG tablet Take 1 tablet (30 mg total) by mouth every 6 (six) hours as needed (palpitations). 30 tablet 1   hydrOXYzine (ATARAX/VISTARIL) 25 MG tablet Take 12.5-25 mg by mouth at bedtime.     montelukast (SINGULAIR) 10 MG tablet Take 10 mg by mouth daily.     Olopatadine HCl 0.2 % SOLN Place 1 drop into both eyes daily as needed. (Patient taking differently: Place 1 drop into both eyes daily as needed (itching).) 2.5 mL 5   pantoprazole (PROTONIX) 40 MG tablet Take 40 mg by mouth daily.     sertraline (ZOLOFT) 100 MG tablet Take 150 mg by mouth daily.      torsemide (DEMADEX) 10 MG tablet Take 1 tablet (10 mg total) by mouth daily. 90 tablet 2   traMADol (ULTRAM) 50 MG tablet Take 1 tablet (50 mg total) by mouth every 6 (six) hours as needed for moderate pain. 60 tablet 3   traZODone  (DESYREL) 50 MG tablet Take by mouth.     TRELEGY ELLIPTA 100-62.5-25 MCG/ACT AEPB Take 1 puff by mouth daily.     No current facility-administered medications for this visit.     ASSESSMENT & PLAN:   Assessment:   1.  Stage I breast cancer diagnosed in August 1999.  She remains without evidence of recurrence.  2.  Former smoker with COPD. It has been 9 years since she quit smoking, after 35 pack years.  We will continue annual low dose lung cancer screening CT scans until it has been 15 years.  We have now found a probable lung cancer and so this emphasizes the benefit of the screening CT scans.  3.  Mild fibrocystic changes of the left breast. I think the pain she feels may be aggravated by increased caffeine.  4.  New pulmonary nodule measuring 15.9 cm and positive on PET scan. I explained this is most likely a bronchogenic carcinoma of the lung, but I cannot rule out a metastasis from her breast cancer or other pathology. She will see Dr.Icard next week. I think she needs surgical excision and we will see if he recommends bronchoscopy first.     Plan:The lung cancer screening CT scan from 06/03/22 reveals new large irregular subpleural solid pulmonary nodule of the right middle lobe measuring 15.9 mm in mean diameter, Lung RADS 4B, and suspicious. She then had a PET scan on 06/12/22 which reveals a hypermetabolic RIGHT upper lobe pulmonary nodule most consistent  with bronchogenic carcinoma.  There was no evidence of metastatic adenopathy or distant metastatic disease. She does have an appointment with Dr.Icard next week, hopefully she will be able to start scheduling these appointments soon. We will get Xrays of her throacic spine and other painful areas, I have prescribed a larger supply of Tramadol 50 mg for her pain.  She is averaging just 2 daily.  I will see her back in 1 month to discuss her appointment with Dr.Icard and surgical pathology. She understands and agrees with this plan of  care.   I provided 30 minutes of face-to-face time during this this encounter and > 50% was spent counseling as documented under my assessment and plan.    Dellia Beckwith, MD [facility] [address] [place] [address] Dept: [phone] Dept Fax: [phone]    Avelina Laine as a scribe for Dellia Beckwith, MD.,have documented all relevant documentation on the behalf of Dellia Beckwith, MD,as directed by  Dellia Beckwith, MD while in the presence of Dellia Beckwith, MD.

## 2022-06-14 DIAGNOSIS — R911 Solitary pulmonary nodule: Secondary | ICD-10-CM | POA: Diagnosis present

## 2022-06-14 DIAGNOSIS — J449 Chronic obstructive pulmonary disease, unspecified: Secondary | ICD-10-CM | POA: Diagnosis not present

## 2022-06-14 DIAGNOSIS — Z87891 Personal history of nicotine dependence: Secondary | ICD-10-CM | POA: Diagnosis not present

## 2022-06-14 DIAGNOSIS — Z9221 Personal history of antineoplastic chemotherapy: Secondary | ICD-10-CM | POA: Diagnosis not present

## 2022-06-14 DIAGNOSIS — F418 Other specified anxiety disorders: Secondary | ICD-10-CM | POA: Diagnosis not present

## 2022-06-14 DIAGNOSIS — Z853 Personal history of malignant neoplasm of breast: Secondary | ICD-10-CM | POA: Diagnosis not present

## 2022-06-14 DIAGNOSIS — Z923 Personal history of irradiation: Secondary | ICD-10-CM | POA: Diagnosis not present

## 2022-06-14 DIAGNOSIS — Z8673 Personal history of transient ischemic attack (TIA), and cerebral infarction without residual deficits: Secondary | ICD-10-CM | POA: Diagnosis not present

## 2022-06-14 LAB — CMP (CANCER CENTER ONLY)
ALT: 15 U/L (ref 0–44)
AST: 23 U/L (ref 15–41)
Albumin: 4 g/dL (ref 3.5–5.0)
Alkaline Phosphatase: 98 U/L (ref 38–126)
Anion gap: 11 (ref 5–15)
BUN: 16 mg/dL (ref 8–23)
CO2: 24 mmol/L (ref 22–32)
Calcium: 8.9 mg/dL (ref 8.9–10.3)
Chloride: 105 mmol/L (ref 98–111)
Creatinine: 0.92 mg/dL (ref 0.44–1.00)
GFR, Estimated: >60 mL/min (ref >60)
Glucose, Bld: 111 mg/dL — ABNORMAL HIGH (ref 70–99)
Potassium: 5.1 mmol/L (ref 3.5–5.1)
Sodium: 140 mmol/L (ref 135–145)
Total Bilirubin: 0.7 mg/dL (ref 0.3–1.2)
Total Protein: 7.3 g/dL (ref 6.5–8.1)

## 2022-06-14 LAB — CBC WITH DIFFERENTIAL (CANCER CENTER ONLY)
Abs Immature Granulocytes: 0.02 10*3/uL (ref 0.00–0.07)
Basophils Absolute: 0 10*3/uL (ref 0.0–0.1)
Basophils Relative: 0 %
Eosinophils Absolute: 0.3 10*3/uL (ref 0.0–0.5)
Eosinophils Relative: 4 %
HCT: 36.9 % (ref 36.0–46.0)
Hemoglobin: 11.9 g/dL — ABNORMAL LOW (ref 12.0–15.0)
Immature Granulocytes: 0 %
Lymphocytes Relative: 19 %
Lymphs Abs: 1.5 10*3/uL (ref 0.7–4.0)
MCH: 28.1 pg (ref 26.0–34.0)
MCHC: 32.2 g/dL (ref 30.0–36.0)
MCV: 87 fL (ref 80.0–100.0)
Monocytes Absolute: 0.7 10*3/uL (ref 0.1–1.0)
Monocytes Relative: 9 %
Neutro Abs: 5.2 10*3/uL (ref 1.7–7.7)
Neutrophils Relative %: 68 %
Platelet Count: 213 10*3/uL (ref 150–400)
RBC: 4.24 MIL/uL (ref 3.87–5.11)
RDW: 13.2 % (ref 11.5–15.5)
WBC Count: 7.8 10*3/uL (ref 4.0–10.5)
nRBC: 0 % (ref 0.0–0.2)

## 2022-06-15 LAB — CEA: CEA: 2.8 ng/mL (ref 0.0–4.7)

## 2022-06-18 ENCOUNTER — Ambulatory Visit (INDEPENDENT_AMBULATORY_CARE_PROVIDER_SITE_OTHER): Payer: Medicare Other | Admitting: Pulmonary Disease

## 2022-06-18 ENCOUNTER — Encounter: Payer: Self-pay | Admitting: Pulmonary Disease

## 2022-06-18 ENCOUNTER — Telehealth: Payer: Self-pay

## 2022-06-18 VITALS — BP 120/80 | HR 50 | Ht 68.0 in | Wt 173.2 lb

## 2022-06-18 DIAGNOSIS — R911 Solitary pulmonary nodule: Secondary | ICD-10-CM | POA: Insufficient documentation

## 2022-06-18 DIAGNOSIS — R942 Abnormal results of pulmonary function studies: Secondary | ICD-10-CM

## 2022-06-18 NOTE — Telephone Encounter (Signed)
-----  Message from Dellia Beckwith, MD sent at 06/17/2022  8:24 PM EST ----- Regarding: call Tell her labs look good incl cancer test

## 2022-06-18 NOTE — Telephone Encounter (Signed)
Attempted to contact patient. No answer. 

## 2022-06-18 NOTE — H&P (View-Only) (Signed)
Synopsis: Referred in January 2024 for Lung nodule by Gus Height, PA-C  Subjective:   PATIENT ID: Dana [name] GENDER: female DOB: [birth year], MRN: [id number]  Chief Complaint  Patient presents with   Consult    Lung nodule    This is a 62 year old female, past medical history of breast cancer status postmastectomy approximately 25 years ago.  She had ER negative breast cancer.  She has paroxysmal atrial fibrillation on Eliquis, longstanding history of smoking quit 10 years ago enrolled in lung cancer screening program which revealed a new right upper lobe 1.7 cm nodule.  She was sent for nuclear medicine pet imaging that was completed which revealed hypermetabolism with an SUV of 7.4 concerning for malignancy.  Patient was referred here for consideration of biopsy.  She also has a history of melanoma "she believes that was removed approximately 10 years ago.  She is also had several other skin cancers.    Past Medical History:  Diagnosis Date   Asthma    Atypical chest pain 03/25/2016   Breast cancer (HCC)    COPD (chronic obstructive pulmonary disease) (HCC)    Dizziness 08/18/2017   Hx of adenomatous polyp of colon 08/09/2009   Hyperthyroidism 01/04/2016   Late effects of CVA (cerebrovascular accident) 03/25/2016   Lymphedema of right upper extremity 08/09/2020   Malignant neoplasm of central portion of breast in female, estrogen receptor negative (HCC) 03/25/2016   Obstructive sleep apnea syndrome 03/25/2016   Paroxysmal atrial fibrillation (HCC) 03/25/2016   Precordial chest pain 08/16/2020     Family History  Problem Relation Age of Onset   Hypertension Maternal Grandmother    Lung cancer Maternal Uncle    Hypertension Mother    Ovarian cancer Paternal Aunt    Ovarian cancer Paternal Grandfather      Past Surgical History:  Procedure Laterality Date   ABDOMINAL HYSTERECTOMY     APPENDECTOMY     BREAST BIOPSY     lymph node removal     PORTACATH PLACEMENT      portacath removal     TONSILLECTOMY      Social History   Socioeconomic History   Marital status: Married    Spouse name: Not on file   Number of children: Not on file   Years of education: Not on file   Highest education level: Not on file  Occupational History   Not on file  Tobacco Use   Smoking status: Former    Types: Cigarettes    Quit date: 02/24/2013    Years since quitting: 9.3   Smokeless tobacco: Never  Vaping Use   Vaping Use: Never used  Substance and Sexual Activity   Alcohol use: Yes    Comment: Once a year   Drug use: No   Sexual activity: Not on file  Other Topics Concern   Not on file  Social History Narrative   Not on file   Social Determinants of Health   Financial Resource Strain: Not on file  Food Insecurity: Not on file  Transportation Needs: Not on file  Physical Activity: Not on file  Stress: Not on file  Social Connections: Not on file  Intimate Partner Violence: Not on file     No Known Allergies   Outpatient Medications Prior to Visit  Medication Sig Dispense Refill   montelukast (SINGULAIR) 10 MG tablet Take 10 mg by mouth daily.     TRELEGY ELLIPTA 100-62.5-25 MCG/ACT AEPB Take 1 puff by mouth daily.  apixaban (ELIQUIS) 5 MG TABS tablet Take 1 tablet (5 mg total) by mouth 2 (two) times daily. 180 tablet 3   atenolol (TENORMIN) 50 MG tablet Take 50 mg by mouth daily.     atorvastatin (LIPITOR) 80 MG tablet Take 1 tablet (80 mg total) by mouth daily. 90 tablet 3   diltiazem (CARDIZEM) 30 MG tablet Take 1 tablet (30 mg total) by mouth every 6 (six) hours as needed (palpitations). 30 tablet 1   hydrOXYzine (ATARAX/VISTARIL) 25 MG tablet Take 12.5-25 mg by mouth at bedtime.     Olopatadine HCl 0.2 % SOLN Place 1 drop into both eyes daily as needed. (Patient taking differently: Place 1 drop into both eyes daily as needed (itching).) 2.5 mL 5   pantoprazole (PROTONIX) 40 MG tablet Take 40 mg by mouth daily.     sertraline (ZOLOFT)  100 MG tablet Take 150 mg by mouth daily.      torsemide (DEMADEX) 10 MG tablet Take 1 tablet (10 mg total) by mouth daily. 90 tablet 2   traMADol (ULTRAM) 50 MG tablet Take 1 tablet (50 mg total) by mouth every 6 (six) hours as needed for moderate pain. 60 tablet 3   traZODone (DESYREL) 50 MG tablet Take by mouth.     No facility-administered medications prior to visit.    Review of Systems  Constitutional:  Negative for chills, fever, malaise/fatigue and weight loss.  HENT:  Negative for hearing loss, sore throat and tinnitus.   Eyes:  Negative for blurred vision and double vision.  Respiratory:  Negative for cough, hemoptysis, sputum production, shortness of breath, wheezing and stridor.   Cardiovascular:  Negative for chest pain, palpitations, orthopnea, leg swelling and PND.  Gastrointestinal:  Negative for abdominal pain, constipation, diarrhea, heartburn, nausea and vomiting.  Genitourinary:  Negative for dysuria, hematuria and urgency.  Musculoskeletal:  Negative for joint pain and myalgias.  Skin:  Negative for itching and rash.  Neurological:  Negative for dizziness, tingling, weakness and headaches.  Endo/Heme/Allergies:  Negative for environmental allergies. Does not bruise/bleed easily.  Psychiatric/Behavioral:  Negative for depression. The patient is not nervous/anxious and does not have insomnia.   All other systems reviewed and are negative.    Objective:  Physical Exam Vitals reviewed.  Constitutional:      General: She is not in acute distress.    Appearance: She is well-developed.  HENT:     Head: Normocephalic and atraumatic.  Eyes:     General: No scleral icterus.    Conjunctiva/sclera: Conjunctivae normal.     Pupils: Pupils are equal, round, and reactive to light.  Neck:     Vascular: No JVD.     Trachea: No tracheal deviation.  Cardiovascular:     Rate and Rhythm: Normal rate and regular rhythm.     Heart sounds: Normal heart sounds. No murmur  heard. Pulmonary:     Effort: Pulmonary effort is normal. No tachypnea, accessory muscle usage or respiratory distress.     Breath sounds: No stridor. No wheezing, rhonchi or rales.  Abdominal:     General: There is no distension.     Palpations: Abdomen is soft.     Tenderness: There is no abdominal tenderness.  Musculoskeletal:        General: No tenderness.     Cervical back: Neck supple.  Lymphadenopathy:     Cervical: No cervical adenopathy.  Skin:    General: Skin is warm and dry.     Capillary Refill: Capillary  refill takes less than 2 seconds.     Findings: No rash.  Neurological:     Mental Status: She is alert and oriented to person, place, and time.  Psychiatric:        Behavior: Behavior normal.      Vitals:   06/18/22 1419  BP: 120/80  Pulse: (!) 50  SpO2: 98%  Weight: 173 lb 3.2 oz (78.6 kg)  Height: 5\' 8"  (1.727 m)   98% on RA BMI Readings from Last 3 Encounters:  06/18/22 26.33 kg/m  06/13/22 26.05 kg/m  05/02/22 26.64 kg/m   Wt Readings from Last 3 Encounters:  06/18/22 173 lb 3.2 oz (78.6 kg)  06/13/22 171 lb 4.8 oz (77.7 kg)  05/02/22 175 lb 3.2 oz (79.5 kg)     CBC    Component Value Date/Time   WBC 7.8 06/14/2022 0836   WBC 8.1 04/06/2020 1500   RBC 4.24 06/14/2022 0836   HGB 11.9 (L) 06/14/2022 0836   HGB 12.5 10/27/2017 0000   HCT 36.9 06/14/2022 0836   HCT 37.6 10/27/2017 0000   PLT 213 06/14/2022 0836   PLT 210 10/27/2017 0000   MCV 87.0 06/14/2022 0836   MCV 84 10/27/2017 0000   MCH 28.1 06/14/2022 0836   MCHC 32.2 06/14/2022 0836   RDW 13.2 06/14/2022 0836   RDW 14.2 10/27/2017 0000   LYMPHSABS 1.5 06/14/2022 0836   MONOABS 0.7 06/14/2022 0836   EOSABS 0.3 06/14/2022 0836   BASOSABS 0.0 06/14/2022 0836     Chest Imaging:  Lung cancer screening CT 06/05/2022: 1.5 cm right middle lobe pulmonary nodule against the periphery of the lung. The patient's images have been independently reviewed by me.    Nuclear  medicine pet imaging: [facility] and [facility]. Hypermetabolic 7.4 SUV uptake within the right middle lobe pulmonary nodule. The patient's images have been independently reviewed by me.      Pulmonary Functions Testing Results:     No data to display          FeNO:   Pathology:   Echocardiogram:   Heart Catheterization:     Assessment & Plan:     ICD-10-CM   1. Lung nodule  R91.1 Procedural/ Surgical Case Request: ROBOTIC ASSISTED NAVIGATIONAL BRONCHOSCOPY    Ambulatory referral to Pulmonology    2. Nodule of right lung  R91.1     3. Abnormal PET of right lung  R94.2       Discussion:  This is a 62 year old female, past medical history of tobacco use, quit 10 years ago, past medical history of breast cancer with mastectomy as well as history of melanoma?  That she states was removed approximately 10 years ago.  Her imaging is concerning for primary bronchogenic carcinoma within the right lung.  Plan: Today in the office we discussed the risk benefits alternatives of proceeding with robotic assisted navigation bronchoscopy tissue sampling. We talked about various dates and procedures also to include percutaneous needle biopsy. Patient has decided to move forward with bronchoscopy. Tentative bronchoscopy date will be on 07/02/2022. We appreciate PCC's help with scheduling. We talked about the risk of bleeding and pneumothorax.  We will attempt to schedule follow-up with our office approximately 1 week after procedure.  They live about an hour away and would like to do this virtually if possible.     Current Outpatient Medications:    montelukast (SINGULAIR) 10 MG tablet, Take 10 mg by mouth daily., Disp: , Rfl:  TRELEGY ELLIPTA 100-62.5-25 MCG/ACT AEPB, Take 1 puff by mouth daily., Disp: , Rfl:    apixaban (ELIQUIS) 5 MG TABS tablet, Take 1 tablet (5 mg total) by mouth 2 (two) times daily., Disp: 180 tablet, Rfl: 3   atenolol (TENORMIN) 50 MG  tablet, Take 50 mg by mouth daily., Disp: , Rfl:    atorvastatin (LIPITOR) 80 MG tablet, Take 1 tablet (80 mg total) by mouth daily., Disp: 90 tablet, Rfl: 3   diltiazem (CARDIZEM) 30 MG tablet, Take 1 tablet (30 mg total) by mouth every 6 (six) hours as needed (palpitations)., Disp: 30 tablet, Rfl: 1   hydrOXYzine (ATARAX/VISTARIL) 25 MG tablet, Take 12.5-25 mg by mouth at bedtime., Disp: , Rfl:    Olopatadine HCl 0.2 % SOLN, Place 1 drop into both eyes daily as needed. (Patient taking differently: Place 1 drop into both eyes daily as needed (itching).), Disp: 2.5 mL, Rfl: 5   pantoprazole (PROTONIX) 40 MG tablet, Take 40 mg by mouth daily., Disp: , Rfl:    sertraline (ZOLOFT) 100 MG tablet, Take 150 mg by mouth daily. , Disp: , Rfl:    torsemide (DEMADEX) 10 MG tablet, Take 1 tablet (10 mg total) by mouth daily., Disp: 90 tablet, Rfl: 2   traMADol (ULTRAM) 50 MG tablet, Take 1 tablet (50 mg total) by mouth every 6 (six) hours as needed for moderate pain., Disp: 60 tablet, Rfl: 3   traZODone (DESYREL) 50 MG tablet, Take by mouth., Disp: , Rfl:   I spent 62 minutes dedicated to the care of this patient on the date of this encounter to include pre-visit review of records, face-to-face time with the patient discussing conditions above, post visit ordering of testing, clinical documentation with the electronic health record, making appropriate referrals as documented, and communicating necessary findings to members of the patients care team.   Josephine Igo, DO [facility] 06/18/2022 2:45 PM

## 2022-06-18 NOTE — Progress Notes (Signed)
Synopsis: Referred in January 2024 for Lung nodule by Gus Height, PA-C  Subjective:   PATIENT ID: Dana [name] GENDER: female DOB: 16-Nov-1960, MRN: [id number]  Chief Complaint  Patient presents with   Consult    Lung nodule    This is a 62 year old female, past medical history of breast cancer status postmastectomy approximately 25 years ago.  She had ER negative breast cancer.  She has paroxysmal atrial fibrillation on Eliquis, longstanding history of smoking quit 10 years ago enrolled in lung cancer screening program which revealed a new right upper lobe 1.7 cm nodule.  She was sent for nuclear medicine pet imaging that was completed which revealed hypermetabolism with an SUV of 7.4 concerning for malignancy.  Patient was referred here for consideration of biopsy.  She also has a history of melanoma "she believes that was removed approximately 10 years ago.  She is also had several other skin cancers.    Past Medical History:  Diagnosis Date   Asthma    Atypical chest pain 03/25/2016   Breast cancer (HCC)    COPD (chronic obstructive pulmonary disease) (HCC)    Dizziness 08/18/2017   Hx of adenomatous polyp of colon 08/09/2009   Hyperthyroidism 01/04/2016   Late effects of CVA (cerebrovascular accident) 03/25/2016   Lymphedema of right upper extremity 08/09/2020   Malignant neoplasm of central portion of breast in female, estrogen receptor negative (HCC) 03/25/2016   Obstructive sleep apnea syndrome 03/25/2016   Paroxysmal atrial fibrillation (HCC) 03/25/2016   Precordial chest pain 08/16/2020     Family History  Problem Relation Age of Onset   Hypertension Maternal Grandmother    Lung cancer Maternal Uncle    Hypertension Mother    Ovarian cancer Paternal Aunt    Ovarian cancer Paternal Grandfather      Past Surgical History:  Procedure Laterality Date   ABDOMINAL HYSTERECTOMY     APPENDECTOMY     BREAST BIOPSY     lymph node removal     PORTACATH PLACEMENT      portacath removal     TONSILLECTOMY      Social History   Socioeconomic History   Marital status: Married    Spouse name: Not on file   Number of children: Not on file   Years of education: Not on file   Highest education level: Not on file  Occupational History   Not on file  Tobacco Use   Smoking status: Former    Types: Cigarettes    Quit date: 02/24/2013    Years since quitting: 9.3   Smokeless tobacco: Never  Vaping Use   Vaping Use: Never used  Substance and Sexual Activity   Alcohol use: Yes    Comment: Once a year   Drug use: No   Sexual activity: Not on file  Other Topics Concern   Not on file  Social History Narrative   Not on file   Social Determinants of Health   Financial Resource Strain: Not on file  Food Insecurity: Not on file  Transportation Needs: Not on file  Physical Activity: Not on file  Stress: Not on file  Social Connections: Not on file  Intimate Partner Violence: Not on file     No Known Allergies   Outpatient Medications Prior to Visit  Medication Sig Dispense Refill   montelukast (SINGULAIR) 10 MG tablet Take 10 mg by mouth daily.     TRELEGY ELLIPTA 100-62.5-25 MCG/ACT AEPB Take 1 puff by mouth daily.  apixaban (ELIQUIS) 5 MG TABS tablet Take 1 tablet (5 mg total) by mouth 2 (two) times daily. 180 tablet 3   atenolol (TENORMIN) 50 MG tablet Take 50 mg by mouth daily.     atorvastatin (LIPITOR) 80 MG tablet Take 1 tablet (80 mg total) by mouth daily. 90 tablet 3   diltiazem (CARDIZEM) 30 MG tablet Take 1 tablet (30 mg total) by mouth every 6 (six) hours as needed (palpitations). 30 tablet 1   hydrOXYzine (ATARAX/VISTARIL) 25 MG tablet Take 12.5-25 mg by mouth at bedtime.     Olopatadine HCl 0.2 % SOLN Place 1 drop into both eyes daily as needed. (Patient taking differently: Place 1 drop into both eyes daily as needed (itching).) 2.5 mL 5   pantoprazole (PROTONIX) 40 MG tablet Take 40 mg by mouth daily.     sertraline (ZOLOFT)  100 MG tablet Take 150 mg by mouth daily.      torsemide (DEMADEX) 10 MG tablet Take 1 tablet (10 mg total) by mouth daily. 90 tablet 2   traMADol (ULTRAM) 50 MG tablet Take 1 tablet (50 mg total) by mouth every 6 (six) hours as needed for moderate pain. 60 tablet 3   traZODone (DESYREL) 50 MG tablet Take by mouth.     No facility-administered medications prior to visit.    Review of Systems  Constitutional:  Negative for chills, fever, malaise/fatigue and weight loss.  HENT:  Negative for hearing loss, sore throat and tinnitus.   Eyes:  Negative for blurred vision and double vision.  Respiratory:  Negative for cough, hemoptysis, sputum production, shortness of breath, wheezing and stridor.   Cardiovascular:  Negative for chest pain, palpitations, orthopnea, leg swelling and PND.  Gastrointestinal:  Negative for abdominal pain, constipation, diarrhea, heartburn, nausea and vomiting.  Genitourinary:  Negative for dysuria, hematuria and urgency.  Musculoskeletal:  Negative for joint pain and myalgias.  Skin:  Negative for itching and rash.  Neurological:  Negative for dizziness, tingling, weakness and headaches.  Endo/Heme/Allergies:  Negative for environmental allergies. Does not bruise/bleed easily.  Psychiatric/Behavioral:  Negative for depression. The patient is not nervous/anxious and does not have insomnia.   All other systems reviewed and are negative.    Objective:  Physical Exam Vitals reviewed.  Constitutional:      General: She is not in acute distress.    Appearance: She is well-developed.  HENT:     Head: Normocephalic and atraumatic.  Eyes:     General: No scleral icterus.    Conjunctiva/sclera: Conjunctivae normal.     Pupils: Pupils are equal, round, and reactive to light.  Neck:     Vascular: No JVD.     Trachea: No tracheal deviation.  Cardiovascular:     Rate and Rhythm: Normal rate and regular rhythm.     Heart sounds: Normal heart sounds. No murmur  heard. Pulmonary:     Effort: Pulmonary effort is normal. No tachypnea, accessory muscle usage or respiratory distress.     Breath sounds: No stridor. No wheezing, rhonchi or rales.  Abdominal:     General: There is no distension.     Palpations: Abdomen is soft.     Tenderness: There is no abdominal tenderness.  Musculoskeletal:        General: No tenderness.     Cervical back: Neck supple.  Lymphadenopathy:     Cervical: No cervical adenopathy.  Skin:    General: Skin is warm and dry.     Capillary Refill: Capillary  refill takes less than 2 seconds.     Findings: No rash.  Neurological:     Mental Status: She is alert and oriented to person, place, and time.  Psychiatric:        Behavior: Behavior normal.      Vitals:   06/18/22 1419  BP: 120/80  Pulse: (!) 50  SpO2: 98%  Weight: 173 lb 3.2 oz (78.6 kg)  Height: 5\' 8"  (1.727 m)   98% on RA BMI Readings from Last 3 Encounters:  06/18/22 26.33 kg/m  06/13/22 26.05 kg/m  05/02/22 26.64 kg/m   Wt Readings from Last 3 Encounters:  06/18/22 173 lb 3.2 oz (78.6 kg)  06/13/22 171 lb 4.8 oz (77.7 kg)  05/02/22 175 lb 3.2 oz (79.5 kg)     CBC    Component Value Date/Time   WBC 7.8 06/14/2022 0836   WBC 8.1 04/06/2020 1500   RBC 4.24 06/14/2022 0836   HGB 11.9 (L) 06/14/2022 0836   HGB 12.5 10/27/2017 0000   HCT 36.9 06/14/2022 0836   HCT 37.6 10/27/2017 0000   PLT 213 06/14/2022 0836   PLT 210 10/27/2017 0000   MCV 87.0 06/14/2022 0836   MCV 84 10/27/2017 0000   MCH 28.1 06/14/2022 0836   MCHC 32.2 06/14/2022 0836   RDW 13.2 06/14/2022 0836   RDW 14.2 10/27/2017 0000   LYMPHSABS 1.5 06/14/2022 0836   MONOABS 0.7 06/14/2022 0836   EOSABS 0.3 06/14/2022 0836   BASOSABS 0.0 06/14/2022 0836     Chest Imaging:  Lung cancer screening CT 06/05/2022: 1.5 cm right middle lobe pulmonary nodule against the periphery of the lung. The patient's images have been independently reviewed by me.    Nuclear  medicine pet imaging: [facility] and [facility]. Hypermetabolic 7.4 SUV uptake within the right middle lobe pulmonary nodule. The patient's images have been independently reviewed by me.      Pulmonary Functions Testing Results:     No data to display          FeNO:   Pathology:   Echocardiogram:   Heart Catheterization:     Assessment & Plan:     ICD-10-CM   1. Lung nodule  R91.1 Procedural/ Surgical Case Request: ROBOTIC ASSISTED NAVIGATIONAL BRONCHOSCOPY    Ambulatory referral to Pulmonology    2. Nodule of right lung  R91.1     3. Abnormal PET of right lung  R94.2       Discussion:  This is a 62 year old female, past medical history of tobacco use, quit 10 years ago, past medical history of breast cancer with mastectomy as well as history of melanoma?  That she states was removed approximately 10 years ago.  Her imaging is concerning for primary bronchogenic carcinoma within the right lung.  Plan: Today in the office we discussed the risk benefits alternatives of proceeding with robotic assisted navigation bronchoscopy tissue sampling. We talked about various dates and procedures also to include percutaneous needle biopsy. Patient has decided to move forward with bronchoscopy. Tentative bronchoscopy date will be on 07/02/2022. We appreciate PCC's help with scheduling. We talked about the risk of bleeding and pneumothorax.  We will attempt to schedule follow-up with our office approximately 1 week after procedure.  They live about an hour away and would like to do this virtually if possible.     Current Outpatient Medications:    montelukast (SINGULAIR) 10 MG tablet, Take 10 mg by mouth daily., Disp: , Rfl:  TRELEGY ELLIPTA 100-62.5-25 MCG/ACT AEPB, Take 1 puff by mouth daily., Disp: , Rfl:    apixaban (ELIQUIS) 5 MG TABS tablet, Take 1 tablet (5 mg total) by mouth 2 (two) times daily., Disp: 180 tablet, Rfl: 3   atenolol (TENORMIN) 50 MG  tablet, Take 50 mg by mouth daily., Disp: , Rfl:    atorvastatin (LIPITOR) 80 MG tablet, Take 1 tablet (80 mg total) by mouth daily., Disp: 90 tablet, Rfl: 3   diltiazem (CARDIZEM) 30 MG tablet, Take 1 tablet (30 mg total) by mouth every 6 (six) hours as needed (palpitations)., Disp: 30 tablet, Rfl: 1   hydrOXYzine (ATARAX/VISTARIL) 25 MG tablet, Take 12.5-25 mg by mouth at bedtime., Disp: , Rfl:    Olopatadine HCl 0.2 % SOLN, Place 1 drop into both eyes daily as needed. (Patient taking differently: Place 1 drop into both eyes daily as needed (itching).), Disp: 2.5 mL, Rfl: 5   pantoprazole (PROTONIX) 40 MG tablet, Take 40 mg by mouth daily., Disp: , Rfl:    sertraline (ZOLOFT) 100 MG tablet, Take 150 mg by mouth daily. , Disp: , Rfl:    torsemide (DEMADEX) 10 MG tablet, Take 1 tablet (10 mg total) by mouth daily., Disp: 90 tablet, Rfl: 2   traMADol (ULTRAM) 50 MG tablet, Take 1 tablet (50 mg total) by mouth every 6 (six) hours as needed for moderate pain., Disp: 60 tablet, Rfl: 3   traZODone (DESYREL) 50 MG tablet, Take by mouth., Disp: , Rfl:   I spent 62 minutes dedicated to the care of this patient on the date of this encounter to include pre-visit review of records, face-to-face time with the patient discussing conditions above, post visit ordering of testing, clinical documentation with the electronic health record, making appropriate referrals as documented, and communicating necessary findings to members of the patients care team.   Josephine Igo, DO [facility] 06/18/2022 2:45 PM

## 2022-06-18 NOTE — Patient Instructions (Addendum)
Thank you for visiting Dr. Tonia Brooms at [facility]. Today we recommend the following:  Orders Placed This Encounter  Procedures   Procedural/ Surgical Case Request: ROBOTIC ASSISTED NAVIGATIONAL BRONCHOSCOPY   Ambulatory referral to Pulmonology   Bronchoscopy on 07/02/2022  Return in about 3 weeks (around 07/09/2022) for w/ Kandice Robinsons, NP . Virtual Appt if possible.     Please do your part to reduce the spread of COVID-19.

## 2022-06-21 ENCOUNTER — Telehealth: Payer: Self-pay

## 2022-06-21 NOTE — Telephone Encounter (Signed)
-----  Message from Derwood Kaplan, MD sent at 06/17/2022  8:24 PM EST ----- Regarding: call Tell her labs look good incl cancer test

## 2022-06-28 ENCOUNTER — Other Ambulatory Visit: Payer: Self-pay | Admitting: *Deleted

## 2022-06-28 ENCOUNTER — Ambulatory Visit (HOSPITAL_COMMUNITY)
Admission: RE | Admit: 2022-06-28 | Discharge: 2022-06-28 | Disposition: A | Discharge status: Home / Self Care | Payer: Medicare Other | Source: Ambulatory Visit | Attending: Pulmonary Disease | Admitting: Pulmonary Disease

## 2022-06-28 ENCOUNTER — Telehealth: Payer: Self-pay

## 2022-06-28 DIAGNOSIS — R911 Solitary pulmonary nodule: Secondary | ICD-10-CM | POA: Insufficient documentation

## 2022-06-28 NOTE — Telephone Encounter (Signed)
-----   Message from Derwood Kaplan, MD sent at 06/17/2022  8:24 PM EST ----- Regarding: call Tell her labs look good incl cancer test

## 2022-06-28 NOTE — Telephone Encounter (Signed)
Attempted to contact patient. No answer. 

## 2022-06-30 LAB — SPECIMEN STATUS REPORT

## 2022-06-30 LAB — NOVEL CORONAVIRUS, NAA: SARS-CoV-2, NAA: NOT DETECTED

## 2022-07-01 NOTE — Progress Notes (Signed)
Anesthesia Chart Review: Dana [name]  Case: [id number] Date/Time: 07/02/22 1345   Procedure: ROBOTIC ASSISTED NAVIGATIONAL BRONCHOSCOPY (Right) - ION w// CIOS   Anesthesia type: General   Diagnosis: Lung nodule [R91.1]   Pre-op diagnosis: lung nodule   Location: [facility] / [facility]   Surgeons: Garner Nash, DO       DISCUSSION: Patient is a 62 year old female scheduled for the above procedure. Recent lung cancer screening CT revealed a RML lung nodule.  History includes former smoker (quit 02/24/13), PAF (diagnosed ~ 2017), CVA (remote left basal ganglia infarct 10/13/12 CT), OSA (severe OSA 12/30/18), hyperthyroidism (2017), COPD, asthma, breast cancer (s/p lumpectomy, chemoradiation ~ 1999; RUE lymphedema), skin cancer (SCC excision).   Last cardiology visit was on 01/18/22 with Dr. Agustin Cree. History PAF with evidence of old infarct on prior imaging, so managed on Eliquis. 12/05/20 CCTA showed coronary calcium score of 54.6 minimal nonobstructive disease involving proximal LAD and medical therapy recommended. She was overall doing well at that time. Maintaining SR. Asymptomatic for CAD standpoint. On statin for HLD. Six month follow-up planned.   Per instructions by Dr. Valeta Harms, hold anticoagulant [Eliquis] after 2/23/ PM dose. She is a same day work-up. Anesthesia team to evaluate on the day of surgery.    VS:  BP Readings from Last 3 Encounters:  06/18/22 120/80  06/13/22 (!) 151/73  05/02/22 (!) 174/75   Pulse Readings from Last 3 Encounters:  06/18/22 (!) 50  06/13/22 (!) 58  05/02/22 (!) 55     PROVIDERS: Darrol Jump, PA-C is PCP  Jenne Campus, MD is cardiologist Shelva Majestic, MD is cardiologist (for OSA). Last visit 07/27/20.  Hosie Poisson, MD is HEM-ONC June Leap, DO is pulmonologist   LABS: Most recent lab results in [facility] include: Lab Results  Component Value Date   WBC 7.8 06/14/2022   HGB 11.9 (L) 06/14/2022   HCT 36.9  06/14/2022   PLT 213 06/14/2022   GLUCOSE 111 (H) 06/14/2022   CHOL 153 08/21/2021   TRIG 121 08/21/2021   HDL 44 08/21/2021   LDLCALC 87 08/21/2021   ALT 15 06/14/2022   AST 23 06/14/2022   NA 140 06/14/2022   K 5.1 06/14/2022   CL 105 06/14/2022   CREATININE 0.92 06/14/2022   BUN 16 06/14/2022   CO2 24 06/14/2022   TSH 1.10 04/06/2021    Home Sleep Study 12/30/18: IMPRESSIONS - Severe obstructive sleep apnea occurred during this study (AHI  30.1/h). There is a significant positional component with supine sleep AHI 32.8/h vs non-supine sleep AHI 17.07/h. - No significant central sleep apnea occurred during this study (CAI = 0.1/h). - Severe oxygen desaturation was noted during this study to a nadir of 80%. Time spent below 89% was 44.7 minutes. - Patient snored 7.5% during the sleep. RECOMMENDATIONS - In this patient with significant cardiovascular comorbidities and severe OSA recommend an in-lab CPAP titration study...   IMAGES: CT Super D chest 06/28/22: IMPRESSION: 1. 14 x 13 mm peripheral right middle lobe pulmonary nodule is similar to minimally increased compared to recent lung cancer screening CT of 06/03/2022. Imaging features remain concerning for primary bronchogenic neoplasm. 2. No evidence for metastatic disease in the chest. 3. Small hiatal hernia. 4.  Aortic Atherosclerosis (ICD10-I70.0).   PET Scan 06/12/22 ([facility]): IMPRESSION:  1. Hypermetabolic RIGHT upper lobe pulmonary nodule most consistent  with bronchogenic carcinoma.  2. No evidence of metastatic adenopathy or distant metastatic  disease.  EKG: 01/18/22: NSR   CV: CT Coronary 12/05/20: IMPRESSION: 1. Coronary calcium score of 54.6. This was 42 percentile for age and sex matched control. 2. Normal coronary origin with right dominance. 3. Minimal CAD. CAD-RADS 1. Recommend Aggressive medical therapy for primary prevention.  Echo 09/07/20: IMPRESSIONS   1. Left ventricular ejection  fraction, by estimation, is 55 to 60%. The  left ventricle has normal function. The left ventricle has no regional  wall motion abnormalities. There is mild concentric left ventricular  hypertrophy. Left ventricular diastolic  parameters are consistent with Grade II diastolic dysfunction  (pseudonormalization).   2. Right ventricular systolic function is normal. The right ventricular  size is normal. There is normal pulmonary artery systolic pressure.   3. Left atrial size was mildly dilated.   4. The mitral valve is normal in structure. Mild mitral valve  regurgitation. No evidence of mitral stenosis.   5. The aortic valve is normal in structure. Aortic valve regurgitation is  not visualized. No aortic stenosis is present.   6. Abdominal aorta is mildly dilated (2.3 cm).   7. The inferior vena cava is normal in size with greater than 50%  respiratory variability, suggesting right atrial pressure of 3 mmHg.   Cardiac event monitor 09/15/17 - 10/14/17: Indication: Paroxysmal atrial fibrillation - Baseline rhythm: Sinus rhythm - Atrial arrhythmia: 1.  Of atrial tachycardia total 7 beats at rate of 144.  He does not look like atrial fibrillation - Ventricular arrhythmia: None - Conduction abnormality: None - Symptoms: Multiple trigger events for different symptomatology every single time rhythm was normal sinus only once when she complained of fluttering her chest she did have atrial tachycardia Conclusion:  Narrow complex tachycardia most likely atrial tachycardia only 7 beats at rate of 144 bpm the patient felt as palpitations.  Multiple trigger events with normal sinus rhythm     Past Medical History:  Diagnosis Date   Asthma    Atypical chest pain 03/25/2016   Breast cancer ([place])    COPD (chronic obstructive pulmonary disease) ([place])    Dizziness 08/18/2017   Hx of adenomatous polyp of colon 08/09/2009   Hyperthyroidism 01/04/2016   Late effects of CVA (cerebrovascular accident)  03/25/2016   Lymphedema of right upper extremity 08/09/2020   Malignant neoplasm of central portion of breast in female, estrogen receptor negative ([place]) 03/25/2016   Obstructive sleep apnea syndrome 03/25/2016   Paroxysmal atrial fibrillation ([place]) 03/25/2016   Precordial chest pain 08/16/2020    Past Surgical History:  Procedure Laterality Date   ABDOMINAL HYSTERECTOMY     APPENDECTOMY     BREAST BIOPSY     lymph node removal     PORTACATH PLACEMENT     portacath removal     TONSILLECTOMY      MEDICATIONS: No current facility-administered medications for this encounter.    apixaban (ELIQUIS) 5 MG TABS tablet   atenolol (TENORMIN) 50 MG tablet   atorvastatin (LIPITOR) 80 MG tablet   diltiazem (CARDIZEM) 30 MG tablet   ibuprofen (ADVIL) 200 MG tablet   montelukast (SINGULAIR) 10 MG tablet   mupirocin ointment (BACTROBAN) 2 %   pantoprazole (PROTONIX) 40 MG tablet   sertraline (ZOLOFT) 100 MG tablet   torsemide (DEMADEX) 10 MG tablet   traMADol (ULTRAM) 50 MG tablet   TRELEGY ELLIPTA 100-62.5-25 MCG/ACT AEPB   Myra Gianotti, PA-C Surgical Short Stay/Anesthesiology [facility] [place] Phone [phone] [facility] Phone [phone] 07/01/2022 1:40 PM

## 2022-07-01 NOTE — Anesthesia Preprocedure Evaluation (Signed)
Anesthesia Evaluation  Patient identified by MRN, date of birth, ID band Patient awake    Reviewed: Allergy & Precautions, NPO status , Patient's Chart, lab work & pertinent test results  Airway Mallampati: II  TM Distance: >3 FB Neck ROM: Full    Dental  (+) Dental Advisory Given   Pulmonary asthma , sleep apnea , COPD,  COPD inhaler, former smoker   breath sounds clear to auscultation       Cardiovascular hypertension, Pt. on home beta blockers  Rhythm:Regular Rate:Normal     Neuro/Psych negative neurological ROS     GI/Hepatic negative GI ROS, Neg liver ROS,,,  Endo/Other    Renal/GU negative Renal ROS     Musculoskeletal   Abdominal   Peds  Hematology negative hematology ROS (+)   Anesthesia Other Findings   Reproductive/Obstetrics                             Anesthesia Physical Anesthesia Plan  ASA: 3  Anesthesia Plan: General   Post-op Pain Management: Tylenol PO (pre-op)*   Induction: Intravenous  PONV Risk Score and Plan: 3 and Dexamethasone, Ondansetron, Midazolam and Treatment may vary due to age or medical condition  Airway Management Planned: Oral ETT  Additional Equipment:   Intra-op Plan:   Post-operative Plan: Extubation in OR  Informed Consent: I have reviewed the patients History and Physical, chart, labs and discussed the procedure including the risks, benefits and alternatives for the proposed anesthesia with the patient or authorized representative who has indicated his/her understanding and acceptance.     Dental advisory given  Plan Discussed with: CRNA  Anesthesia Plan Comments: (PAT note written 07/01/2022 by Myra Gianotti, PA-C.  )       Anesthesia Quick Evaluation

## 2022-07-01 NOTE — Progress Notes (Signed)
INSTRUCTIONS ONLY  COVID TEST- Neg 06/28/21  Anesthesia review: Y  Patient verbally denies any shortness of breath, fever, cough and chest pain during phone call   -------------  SDW INSTRUCTIONS given:  Your procedure is scheduled on 07/02/22.  Report to [facility] Main Entrance "A" at 1115 A.M., and check in at the Admitting office.  Call this number if you have problems the morning of surgery:  [phone]   Remember:  Do not eat or drink after midnight the night before your surgery    Take these medicines the morning of surgery with A SIP OF WATER  atenolol (TENORMIN)  diltiazem (CARDIZEM)  pantoprazole (PROTONIX)  sertraline (ZOLOFT)  TRELEGY ELLIPTA   As of today, STOP taking any Aspirin (unless otherwise instructed by your surgeon) Aleve, Naproxen, Ibuprofen, Motrin, Advil, Goody's, BC's, all herbal medications, fish oil, and all vitamins.                      Do not wear jewelry, make up, or nail polish            Do not wear lotions, powders, perfumes/colognes, or deodorant.            Do not shave 48 hours prior to surgery.  Men may shave face and neck.            Do not bring valuables to the hospital.            [facility] is not responsible for any belongings or valuables.  Do NOT Smoke (Tobacco/Vaping) 24 hours prior to your procedure If you use a CPAP at night, you may bring all equipment for your overnight stay.   Contacts, glasses, dentures or bridgework may not be worn into surgery.      For patients admitted to the hospital, discharge time will be determined by your treatment team.   Patients discharged the day of surgery will not be allowed to drive home, and someone needs to stay with them for 24 hours.    Special instructions:   [place]- Preparing For Surgery  Before surgery, you can play an important role. Because skin is not sterile, your skin needs to be as free of germs as possible. You can reduce the number of germs on your skin by  washing with CHG (chlorahexidine gluconate) Soap before surgery.  CHG is an antiseptic cleaner which kills germs and bonds with the skin to continue killing germs even after washing.    Oral Hygiene is also important to reduce your risk of infection.  Remember - BRUSH YOUR TEETH THE MORNING OF SURGERY WITH YOUR REGULAR TOOTHPASTE  Please do not use if you have an allergy to CHG or antibacterial soaps. If your skin becomes reddened/irritated stop using the CHG.  Do not shave (including legs and underarms) for at least 48 hours prior to first CHG shower. It is OK to shave your face.  Please follow these instructions carefully.   Shower the NIGHT BEFORE SURGERY and the MORNING OF SURGERY with DIAL Soap.   Pat yourself dry with a CLEAN TOWEL.  Wear CLEAN PAJAMAS to bed the night before surgery  Place CLEAN SHEETS on your bed the night of your first shower and DO NOT SLEEP WITH PETS.   Day of Surgery: Please shower morning of surgery  Wear Clean/Comfortable clothing the morning of surgery Do not apply any deodorants/lotions.   Remember to brush your teeth WITH YOUR REGULAR TOOTHPASTE.   Questions were  answered. Patient verbalized understanding of instructions.

## 2022-07-02 ENCOUNTER — Ambulatory Visit (HOSPITAL_COMMUNITY): Payer: Medicare Other

## 2022-07-02 ENCOUNTER — Encounter (HOSPITAL_COMMUNITY): Payer: Self-pay | Admitting: Pulmonary Disease

## 2022-07-02 ENCOUNTER — Ambulatory Visit (HOSPITAL_COMMUNITY): Payer: Medicare Other | Admitting: Vascular Surgery

## 2022-07-02 ENCOUNTER — Ambulatory Visit (HOSPITAL_COMMUNITY)
Admission: RE | Admit: 2022-07-02 | Discharge: 2022-07-02 | Disposition: A | Discharge status: Home / Self Care | Payer: Medicare Other | Attending: Pulmonary Disease | Admitting: Pulmonary Disease

## 2022-07-02 ENCOUNTER — Ambulatory Visit (HOSPITAL_BASED_OUTPATIENT_CLINIC_OR_DEPARTMENT_OTHER): Payer: Medicare Other | Admitting: Vascular Surgery

## 2022-07-02 ENCOUNTER — Encounter (HOSPITAL_COMMUNITY)
Admission: RE | Disposition: A | Discharge status: Home / Self Care | Payer: Self-pay | Source: Home / Self Care | Attending: Pulmonary Disease

## 2022-07-02 DIAGNOSIS — Z8041 Family history of malignant neoplasm of ovary: Secondary | ICD-10-CM | POA: Insufficient documentation

## 2022-07-02 DIAGNOSIS — Z85828 Personal history of other malignant neoplasm of skin: Secondary | ICD-10-CM | POA: Insufficient documentation

## 2022-07-02 DIAGNOSIS — I10 Essential (primary) hypertension: Secondary | ICD-10-CM

## 2022-07-02 DIAGNOSIS — Z8673 Personal history of transient ischemic attack (TIA), and cerebral infarction without residual deficits: Secondary | ICD-10-CM | POA: Diagnosis not present

## 2022-07-02 DIAGNOSIS — Z853 Personal history of malignant neoplasm of breast: Secondary | ICD-10-CM | POA: Insufficient documentation

## 2022-07-02 DIAGNOSIS — R846 Abnormal cytological findings in specimens from respiratory organs and thorax: Secondary | ICD-10-CM | POA: Insufficient documentation

## 2022-07-02 DIAGNOSIS — I480 Paroxysmal atrial fibrillation: Secondary | ICD-10-CM | POA: Diagnosis not present

## 2022-07-02 DIAGNOSIS — G4733 Obstructive sleep apnea (adult) (pediatric): Secondary | ICD-10-CM | POA: Diagnosis not present

## 2022-07-02 DIAGNOSIS — R911 Solitary pulmonary nodule: Secondary | ICD-10-CM | POA: Diagnosis present

## 2022-07-02 DIAGNOSIS — J449 Chronic obstructive pulmonary disease, unspecified: Secondary | ICD-10-CM | POA: Diagnosis not present

## 2022-07-02 DIAGNOSIS — Z87891 Personal history of nicotine dependence: Secondary | ICD-10-CM | POA: Diagnosis not present

## 2022-07-02 DIAGNOSIS — E0590 Thyrotoxicosis, unspecified without thyrotoxic crisis or storm: Secondary | ICD-10-CM | POA: Insufficient documentation

## 2022-07-02 DIAGNOSIS — Z801 Family history of malignant neoplasm of trachea, bronchus and lung: Secondary | ICD-10-CM | POA: Insufficient documentation

## 2022-07-02 DIAGNOSIS — D381 Neoplasm of uncertain behavior of trachea, bronchus and lung: Secondary | ICD-10-CM

## 2022-07-02 HISTORY — PX: BRONCHIAL NEEDLE ASPIRATION BIOPSY: SHX5106

## 2022-07-02 HISTORY — PX: VIDEO BRONCHOSCOPY WITH ENDOBRONCHIAL ULTRASOUND: SHX6177

## 2022-07-02 HISTORY — PX: BRONCHIAL BIOPSY: SHX5109

## 2022-07-02 HISTORY — PX: FINE NEEDLE ASPIRATION: SHX5430

## 2022-07-02 LAB — NO BLOOD PRODUCTS

## 2022-07-02 SURGERY — BRONCHOSCOPY, WITH BIOPSY USING ELECTROMAGNETIC NAVIGATION
Anesthesia: General | Laterality: Right

## 2022-07-02 MED ORDER — NALOXONE HCL 0.4 MG/ML IJ SOLN
INTRAMUSCULAR | Status: DC | PRN
  Administered 2022-07-02: 40 ug via INTRAVENOUS

## 2022-07-02 MED ORDER — ONDANSETRON HCL 4 MG/2ML IJ SOLN
INTRAMUSCULAR | Status: DC | PRN
  Administered 2022-07-02: 4 mg via INTRAVENOUS

## 2022-07-02 MED ORDER — LACTATED RINGERS IV SOLN: INTRAVENOUS | Status: DC

## 2022-07-02 MED ORDER — ROCURONIUM BROMIDE 10 MG/ML (PF) SYRINGE
PREFILLED_SYRINGE | INTRAVENOUS | Status: DC | PRN
  Administered 2022-07-02: 50 mg via INTRAVENOUS

## 2022-07-02 MED ORDER — SUGAMMADEX SODIUM 200 MG/2ML IV SOLN
INTRAVENOUS | Status: DC | PRN
  Administered 2022-07-02: 200 mg via INTRAVENOUS
  Administered 2022-07-02: 100 mg via INTRAVENOUS

## 2022-07-02 MED ORDER — MIDAZOLAM HCL 2 MG/2ML IJ SOLN
INTRAMUSCULAR | Status: DC | PRN
  Administered 2022-07-02: 2 mg via INTRAVENOUS

## 2022-07-02 MED ORDER — PROPOFOL 10 MG/ML IV BOLUS
INTRAVENOUS | Status: DC | PRN
  Administered 2022-07-02: 150 mg via INTRAVENOUS
  Administered 2022-07-02: 150 ug/kg/min via INTRAVENOUS

## 2022-07-02 MED ORDER — FENTANYL CITRATE (PF) 250 MCG/5ML IJ SOLN
INTRAMUSCULAR | Status: DC | PRN
  Administered 2022-07-02 (×2): 50 ug via INTRAVENOUS

## 2022-07-02 MED ORDER — CHLORHEXIDINE GLUCONATE 0.12 % MT SOLN
15.0000 mL | OROMUCOSAL | Status: AC
  Administered 2022-07-02: 15 mL via OROMUCOSAL
  Filled 2022-07-02: qty 15

## 2022-07-02 MED ORDER — LIDOCAINE 2% (20 MG/ML) 5 ML SYRINGE
INTRAMUSCULAR | Status: DC | PRN
  Administered 2022-07-02: 60 mg via INTRAVENOUS

## 2022-07-02 NOTE — Discharge Instructions (Signed)

## 2022-07-02 NOTE — Transfer of Care (Signed)
Immediate Anesthesia Transfer of Care Note  Patient: Dana [name]  Procedure(s) Performed: ROBOTIC ASSISTED NAVIGATIONAL BRONCHOSCOPY (Right) BRONCHIAL BIOPSIES BRONCHIAL NEEDLE ASPIRATION BIOPSIES FINE NEEDLE ASPIRATION (FNA) LINEAR VIDEO BRONCHOSCOPY WITH ENDOBRONCHIAL ULTRASOUND  Patient Location: PACU  Anesthesia Type:General  Level of Consciousness: drowsy and patient cooperative  Airway & Oxygen Therapy: Patient connected to face mask oxygen  Post-op Assessment: Report given to RN and Post -op Vital signs reviewed and stable  Post vital signs: Reviewed and stable  Last Vitals:  Vitals Value Taken Time  BP 178/90 07/02/22 1332  Temp    Pulse 60 07/02/22 1336  Resp 18 07/02/22 1336  SpO2 99 % 07/02/22 1336  Vitals shown include unvalidated device data.  Last Pain:  Vitals:   07/02/22 1332  TempSrc: Temporal  PainSc: 0-No pain      Patients Stated Pain Goal: 0 (10/12/31 5825)  Complications: There were no known notable events for this encounter.

## 2022-07-02 NOTE — Op Note (Signed)
Video Bronchoscopy with Robotic Assisted Bronchoscopic Navigation  Video Bronchoscopy with Endobronchial Ultrasound Procedure Note  Date of Operation: 07/02/2022   Pre-op Diagnosis: Lung nodule   Post-op Diagnosis: Lung nodule   Surgeon: Garner Nash, DO   Assistants: None   Anesthesia: General endotracheal anesthesia  Operation: Flexible video fiberoptic bronchoscopy with robotic assistance and biopsies.  Estimated Blood Loss: Minimal  Complications: None  Indications and History: Dana [name] is a 62 y.o. female with history of lung nodule. The risks, benefits, complications, treatment options and expected outcomes were discussed with the patient.  The possibilities of pneumothorax, pneumonia, reaction to medication, pulmonary aspiration, perforation of a viscus, bleeding, failure to diagnose a condition and creating a complication requiring transfusion or operation were discussed with the patient who freely signed the consent.    Description of Procedure: The patient was seen in the Preoperative Area, was examined and was deemed appropriate to proceed.  The patient was taken to [facility] endoscopy room 3, identified as Dana [name] and the procedure verified as Flexible Video Fiberoptic Bronchoscopy.  A Time Out was held and the above information confirmed.   Prior to the date of the procedure a high-resolution CT scan of the chest was performed. Utilizing ION software program a virtual tracheobronchial tree was generated to allow the creation of distinct navigation pathways to the patient's parenchymal abnormalities. After being taken to the operating room general anesthesia was initiated and the patient  was orally intubated. The video fiberoptic bronchoscope was introduced via the endotracheal tube and a general inspection was performed which showed normal right and left lung anatomy, aspiration of the bilateral mainstems was completed to remove any remaining secretions.  Robotic catheter inserted into patient's endotracheal tube.   Target #1 RML nodule: The distinct navigation pathways prepared prior to this procedure were then utilized to navigate to patient's lesion identified on CT scan. The robotic catheter was secured into place and the vision probe was withdrawn.  Lesion location was approximated using fluoroscopy and 3D CBCT for peripheral targeting and CT guided needle placement. Under fluoroscopic guidance transbronchial needle brushings, transbronchial needle biopsies, and transbronchial forceps biopsies were performed to be sent for cytology and pathology.   Target #2 Station 7: The standard scope was then withdrawn and the endobronchial ultrasound was used to identify and characterize the peritracheal, hilar and bronchial lymph nodes. Inspection showed no endobronchial lesion. Using real-time ultrasound guidance Wang needle biopsies were take from Station 7 nodes and were sent for cytology. The patient tolerated the procedure well without apparent complications. There was no significant blood loss.   At the end of the procedure a general airway inspection was performed and there was no evidence of active bleeding. The bronchoscope was removed.  The patient tolerated the procedure well. There was no significant blood loss and there were no obvious complications. A post-procedural chest x-ray is pending.  Samples Target #1: 1. Transbronchial Wang needle biopsies from RML 2. Transbronchial forceps biopsies from RML  Samples: 1. Wang needle biopsies from Station 7 node  Plans:  The patient will be discharged from the PACU to home when recovered from anesthesia and after chest x-ray is reviewed. We will review the cytology, pathology results with the patient when they become available. Outpatient followup will be with Garner Nash, DO.  Garner Nash, DO [facility] 07/02/2022 1:06 PM

## 2022-07-02 NOTE — Interval H&P Note (Signed)
History and Physical Interval Note:  07/02/2022 11:52 AM  Dana [name]  has presented today for surgery, with the diagnosis of lung nodule.  The various methods of treatment have been discussed with the patient and family. After consideration of risks, benefits and other options for treatment, the patient has consented to  Procedure(s) with comments: ROBOTIC ASSISTED NAVIGATIONAL BRONCHOSCOPY (Right) - ION w// CIOS as a surgical intervention.  The patient's history has been reviewed, patient examined, no change in status, stable for surgery.  I have reviewed the patient's chart and labs.  Questions were answered to the patient's satisfaction.     [name]

## 2022-07-02 NOTE — Anesthesia Procedure Notes (Signed)
Procedure Name: Intubation Date/Time: 07/02/2022 12:22 PM  Performed by: Ester Rink, CRNAPre-anesthesia Checklist: Patient identified, Emergency Drugs available, Suction available and Patient being monitored Patient Re-evaluated:Patient Re-evaluated prior to induction Oxygen Delivery Method: Circle system utilized Preoxygenation: Pre-oxygenation with 100% oxygen Induction Type: IV induction Ventilation: Mask ventilation without difficulty Laryngoscope Size: Mac and 3 Grade View: Grade II Tube type: Oral Tube size: 8.5 mm Number of attempts: 1 Airway Equipment and Method: Stylet and Oral airway Placement Confirmation: ETT inserted through vocal cords under direct vision, positive ETCO2 and breath sounds checked- equal and bilateral Secured at: 22 cm Tube secured with: Tape Dental Injury: Teeth and Oropharynx as per pre-operative assessment

## 2022-07-02 NOTE — Anesthesia Postprocedure Evaluation (Signed)
Anesthesia Post Note  Patient: Dana [name]  Procedure(s) Performed: ROBOTIC ASSISTED NAVIGATIONAL BRONCHOSCOPY (Right) BRONCHIAL BIOPSIES BRONCHIAL NEEDLE ASPIRATION BIOPSIES FINE NEEDLE ASPIRATION (FNA) LINEAR VIDEO BRONCHOSCOPY WITH ENDOBRONCHIAL ULTRASOUND     Patient location during evaluation: PACU Anesthesia Type: General Level of consciousness: awake and alert Pain management: pain level controlled Vital Signs Assessment: post-procedure vital signs reviewed and stable Respiratory status: spontaneous breathing, nonlabored ventilation, respiratory function stable and patient connected to nasal cannula oxygen Cardiovascular status: blood pressure returned to baseline and stable Postop Assessment: no apparent nausea or vomiting Anesthetic complications: no  There were no known notable events for this encounter.  Last Vitals:  Vitals:   07/02/22 1441 07/02/22 1450  BP: (!) 144/64 (!) 155/65  Pulse: (!) 48 (!) 50  Resp: 15 13  Temp:    SpO2: 92% 92%    Last Pain:  Vitals:   07/02/22 1450  TempSrc:   PainSc: 0-No pain                 Tiajuana Amass

## 2022-07-04 LAB — CYTOLOGY - NON PAP

## 2022-07-05 ENCOUNTER — Encounter: Payer: Self-pay | Admitting: Oncology

## 2022-07-05 LAB — CYTOLOGY - NON PAP

## 2022-07-06 ENCOUNTER — Encounter (HOSPITAL_COMMUNITY): Payer: Self-pay | Admitting: Pulmonary Disease

## 2022-07-09 ENCOUNTER — Telehealth (INDEPENDENT_AMBULATORY_CARE_PROVIDER_SITE_OTHER): Payer: Medicare Other | Admitting: Acute Care

## 2022-07-09 ENCOUNTER — Encounter: Payer: Self-pay | Admitting: Acute Care

## 2022-07-09 ENCOUNTER — Other Ambulatory Visit: Payer: Self-pay | Admitting: Acute Care

## 2022-07-09 DIAGNOSIS — Z853 Personal history of malignant neoplasm of breast: Secondary | ICD-10-CM

## 2022-07-09 DIAGNOSIS — Z87891 Personal history of nicotine dependence: Secondary | ICD-10-CM

## 2022-07-09 DIAGNOSIS — C3491 Malignant neoplasm of unspecified part of right bronchus or lung: Secondary | ICD-10-CM | POA: Diagnosis not present

## 2022-07-09 DIAGNOSIS — C3490 Malignant neoplasm of unspecified part of unspecified bronchus or lung: Secondary | ICD-10-CM

## 2022-07-09 NOTE — Patient Instructions (Addendum)
It is good to see you today. Your lung biopsies were positive for squamous cell cancer. We will refer you to thoracic Surgery for evaluation . I have also ordered an MRI brain for staging and Pulmonary Function tests. You will get a call to have these scheduled.  Dana [name], the surgery team will take great care of you moving forward.  Please contact office for sooner follow up if symptoms do not improve or worsen or seek emergency care

## 2022-07-09 NOTE — Progress Notes (Signed)
Virtual Visit via Video Note  I connected with Dana [name] on 07/09/22 at 10:30 AM EST by a video enabled telemedicine application and verified that I am speaking with the correct person using two identifiers.  Location: Patient:  At home Provider:  [facility], [place], [place], [address]    I discussed the limitations of evaluation and management by telemedicine and the availability of in person appointments. The patient expressed understanding and agreed to proceed.  Synopsis This is a 62 year old female, past medical history of tobacco use, quit 10 years ago , past medical history of right sided breast cancer with mastectomy as well as history of melanoma?  She states was removed approximately 10 years ago from her scalp. She thinks this was a squamous cell cancer.  Her imaging is concerning for primary bronchogenic carcinoma within the right lung. She underwent Flexible video fiberoptic bronchoscopy with robotic assistance and biopsies on 07/02/2022 per Dr. Valeta Harms.   History of Present Illness: Pt. States she has been coughing up blood each day since procedure 1 week ago. She states there were some blood clots. She states today she has not coughed up any blood. She said she is coughing more than normal. She is not taking any cough suppressant. She has not called the office to let us know She denies any fever. She had a sore throat for 2 days after the procedure that has now resolved.   We will send in a cough suppressant . I have told her if she continues to have blood in her sputum she needs to call the office and let us know.   We reviewed her cytology which is positive for squamous cell cancer of the right middle love. The #7 node that was biopsied was negative for malignant cells.   Plan will be to have her evaluated by Thoracic surgery . I will order PFT's and MRI Brain.  She verbalized understanding of the above . She understands she will get a call to schedule her  consult and her PFT's.     Observations/Objective: Test Results: 07/02/2022 FINAL MICROSCOPIC DIAGNOSIS:  B. LYMPH NODE, STATION 7, FINE NEEDLE ASPIRATION:  No malignant cells identified    FINAL MICROSCOPIC DIAGNOSIS:  A. LUNG, RML, FINE NEEDLE ASPIRATION:  Non-small cell carcinoma  See comment  There is scant tumor cellularity in the cellblock and immunohistochemistry shows the tumor cells are positive with cytokeratin 5/6 and p40 and are negative with TTF-1 consistent with squamous cell carcinoma.  There is insufficient cellularity in the cellblock for molecular testing.   Lung cancer screening CT 06/05/2022: 1.5 cm right middle lobe pulmonary nodule against the periphery of the lung. The patient's images have been independently reviewed by me.   Nuclear medicine pet imaging: Images viewable from [facility] and PACS. Hypermetabolic 7.4 SUV uptake within the right middle lobe pulmonary nodule.     Assessment and Plan: New diagnosis Squamous carcinoma of the lung Former tobacco smoker History of  Right sided Breast cancer with mastectomy History of melanoma of the scalp Plan It is good to see you today. Your lung biopsies were positive for squamous cell cancer. We will refer you to thoracic Surgery for evaluation . I have also ordered an MRI brain for staging and Pulmonary Function tests. You will get a call to have these scheduled.  We will send in some medication to help suppress your cough Please call us if you continue to cough up blood.  Seek emergency care if there  is an increase in the blood in your sputum. Emmit Alexanders, the surgery team will take great care of you moving forward.  Please contact office for sooner follow up if symptoms do not improve or worsen or seek emergency care    Follow Up Instructions: Follow up with Dr. Kipp Brood 07/26/2022 as is scheduled PFT's as scheduled 07/12/2022 MRI Brain as scheduled 07/18/2022   I discussed the assessment and treatment  plan with the patient. The patient was provided an opportunity to ask questions and all were answered. The patient agreed with the plan and demonstrated an understanding of the instructions.   The patient was advised to call back or seek an in-person evaluation if the symptoms worsen or if the condition fails to improve as anticipated.  I provided 35 minutes of non-face-to-face time during this encounter.   Magdalen Spatz, NP

## 2022-07-10 NOTE — Progress Notes (Signed)
Path results reviewed with patient during recent office visit with SG. Surgery appt coming up with HL  Thanks,  BLI  Garner Nash, DO [facility] 07/10/2022 11:43 AM

## 2022-07-11 ENCOUNTER — Other Ambulatory Visit: Payer: Self-pay

## 2022-07-12 ENCOUNTER — Other Ambulatory Visit: Payer: Self-pay

## 2022-07-12 ENCOUNTER — Encounter (HOSPITAL_BASED_OUTPATIENT_CLINIC_OR_DEPARTMENT_OTHER): Payer: Medicare Other

## 2022-07-12 NOTE — Progress Notes (Signed)
The proposed treatment discussed in conference is for discussion purpose only and is not a binding recommendation.  The patients have not been physically examined, or presented with their treatment options.  Therefore, final treatment plans cannot be decided.  

## 2022-07-15 ENCOUNTER — Inpatient Hospital Stay: Payer: Medicare Other | Attending: Oncology | Admitting: Oncology

## 2022-07-15 ENCOUNTER — Other Ambulatory Visit: Payer: Self-pay | Admitting: Oncology

## 2022-07-15 DIAGNOSIS — J449 Chronic obstructive pulmonary disease, unspecified: Secondary | ICD-10-CM

## 2022-07-15 DIAGNOSIS — C3491 Malignant neoplasm of unspecified part of right bronchus or lung: Secondary | ICD-10-CM

## 2022-07-15 DIAGNOSIS — C3490 Malignant neoplasm of unspecified part of unspecified bronchus or lung: Secondary | ICD-10-CM

## 2022-07-15 NOTE — Progress Notes (Signed)
[facility]  [address] [place],  [name]  [address] [phone]  Clinic Day: 07/15/22   Referring physician: Darrol Jump, PA-C    CHIEF COMPLAINT:  CC: History of stage I breast cancer  Current Treatment:  Surveillance   HISTORY OF PRESENT ILLNESS:  Dana [name] is a 62 y.o. female with a history of stage I breast cancer diagnosed in August 1999.  This was a 1.1 cm invasive ductal carcinoma, treated with lumpectomy, CMF chemotherapy, and radiation.  She did not tolerate tamoxifen, but has never had evidence of recurrence.  We began seeing her in August 2004, when she moved to the area.  She is on yearly follow-up, but was last seen in August of 2016.  We did CT scans in April of 2016, and found no evidence of malignancy, but there was a 6 mm nodule in the posterior right upper lobe on the CT chest.  She was seen in July with CT chest to follow up on the lung nodule, and it had resolved.  Due to her personal history of breast cancer diagnosed at age 11, we have recommended genetic testing with the [facility] Hereditary Cancer Panel testing on July 11th, 2016 with [place] and that result was negative.  She has had a total abdominal hysterectomy for dysfunctional uterine bleeding, but her ovaries are intact.  She states her last colonoscopy was in 2011, and she is due for this again. She had 2 polyps removed at that time, one of which was a tubular adenoma.  She also has atrial fibrillation, history of pneumonia, mild to moderate tricuspid regurgitation, and history of prior stroke in 2014.  I had seen her in October 2017 and we evaluated multiple issues that she was dealing with.  She is off the Prilosec and now on Protonix 40 mg daily and also on vitamin D2 1.25 mg weekly, in addition to her usual medications.  She had a squamous cell carcinoma removed from her right shoulder, and a squamous cell carcinoma in situ removed from the  left anterior chest.  CT imaging from September 2021 revealed stable, benign small pulmonary nodules.  She is on annual low-dose CT lung cancer screening due to his smoking history of 1 pack/day for 35 years.  She did quit 9 years ago.   She does have a history of peptic ulcer disease.  She has had some anxiety and depression.  INTERVAL HISTORY:  Dana [name] is here with her husband for his visit and we discussed her recent developments. The lung cancer screening CT scan from 06/03/22 reveals a new large irregular subpleural solid pulmonary nodule of the right middle lobe measuring 15.9 mm in mean diameter, for a lung RADS 4B, suspicious. She then had a PET scan on 06/12/22 which reveals a hypermetabolic RIGHT upper lobe pulmonary nodule most consistent with bronchogenic carcinoma. There was no evidence of metastatic adenopathy or distant metastatic disease. She has now been seen by a pulmonologist, Dr. Valeta Harms, and her bronchoscopy revealed a squamous cell carcinoma. He made a referral to a chest surgeon but she has canceled the appointment until she spoke with me, I emphasized that the best course of action is surgical resection and she would like to go to a surgeon in [facility] so I will make that referral. I do agree that a MRI of the brain and pulmonary function test are appropriate before surgery and will schedule those, since she had canceled all appointments. We discussed  the diagnoses and likelihood that this is a stage I, so she is not likely to need further treatment after surgery. However, I will bring her back a few weeks after her surgery to review the final pathology and consider Foundation 1 or Guardant testing. Most likely we will put her on surveillance after that, but that would depend on the pathology result.   REVIEW OF SYSTEMS:  Review of Systems  Constitutional: Negative.  Negative for appetite change, chills, diaphoresis, fatigue, fever and unexpected weight change.  HENT:  Negative.   Negative for hearing loss, lump/mass, mouth sores, nosebleeds, sore throat, tinnitus, trouble swallowing and voice change.   Eyes: Negative.  Negative for eye problems and icterus.  Respiratory: Negative.  Negative for chest tightness, cough, hemoptysis, shortness of breath and wheezing.   Cardiovascular: Negative.  Negative for chest pain, leg swelling and palpitations.  Gastrointestinal: Negative.  Negative for abdominal distention, abdominal pain, blood in stool, constipation, diarrhea, nausea, rectal pain and vomiting.  Endocrine: Negative.   Genitourinary: Negative.  Negative for bladder incontinence, difficulty urinating, dyspareunia, dysuria, frequency, hematuria, menstrual problem, nocturia, pelvic pain, vaginal bleeding and vaginal discharge.   Musculoskeletal:  Positive for arthralgias (generalized) and myalgias (generalized). Negative for back pain, flank pain, gait problem, neck pain and neck stiffness.  Skin: Negative.  Negative for itching, rash and wound.  Neurological: Negative.  Negative for dizziness, extremity weakness, gait problem, headaches, light-headedness, numbness, seizures and speech difficulty.  Hematological: Negative.  Negative for adenopathy. Does not bruise/bleed easily.  Psychiatric/Behavioral:  Positive for sleep disturbance (due to generalized pain). Negative for confusion, decreased concentration, depression and suicidal ideas. The patient is not nervous/anxious.      VITALS:  There were no vitals taken for this visit.  Wt Readings from Last 3 Encounters:  07/02/22 173 lb 3.2 oz (78.6 kg)  06/18/22 173 lb 3.2 oz (78.6 kg)  06/13/22 171 lb 4.8 oz (77.7 kg)    There is no height or weight on file to calculate BMI.  Performance status (ECOG): 1 - Symptomatic but completely ambulatory  PHYSICAL EXAM:  Physical exam was deferred in order to discuss the pathology findings and answer her questions.  Physical Exam Vitals and nursing note reviewed. Exam  conducted with a chaperone present.  Constitutional:      General: She is not in acute distress.    Appearance: Normal appearance. She is normal weight. She is not ill-appearing, toxic-appearing or diaphoretic.  HENT:     Head: Normocephalic and atraumatic.     Right Ear: Tympanic membrane, ear canal and external ear normal. There is no impacted cerumen.     Left Ear: Tympanic membrane, ear canal and external ear normal. There is no impacted cerumen.     Nose: Nose normal. No congestion or rhinorrhea.     Mouth/Throat:     Mouth: Mucous membranes are moist.     Pharynx: Oropharynx is clear. No oropharyngeal exudate or posterior oropharyngeal erythema.  Eyes:     General: No scleral icterus.       Right eye: No discharge.        Left eye: No discharge.     Extraocular Movements: Extraocular movements intact.     Conjunctiva/sclera: Conjunctivae normal.     Pupils: Pupils are equal, round, and reactive to light.  Neck:     Vascular: No carotid bruit.  Cardiovascular:     Rate and Rhythm: Normal rate and regular rhythm.     Pulses: Normal pulses.  Heart sounds: Normal heart sounds. No murmur heard.    No friction rub. No gallop.  Pulmonary:     Effort: Pulmonary effort is normal. No respiratory distress.     Breath sounds: Normal breath sounds. No stridor. No wheezing, rhonchi or rales.     Comments: Scars on her back from moles being removed, the upper right back. Chest:     Chest wall: No tenderness.  Breasts:    Right: Normal.     Left: Normal.     Comments: Scar in the right breast at 3 o'clock which is barely visible. Scar in the right axilla which is healed. Faint scars in the upper left axilla consistent with healed hidradenitis.  Abdominal:     General: Bowel sounds are normal. There is no distension.     Palpations: Abdomen is soft. There is no hepatomegaly, splenomegaly or mass.     Tenderness: There is no abdominal tenderness. There is no right CVA tenderness, left  CVA tenderness, guarding or rebound.     Hernia: No hernia is present.  Musculoskeletal:        General: No swelling, tenderness or deformity. Normal range of motion.     Cervical back: Normal range of motion and neck supple. No rigidity or tenderness.     Right lower leg: No edema.     Left lower leg: No edema.  Lymphadenopathy:     Cervical: No cervical adenopathy.  Skin:    General: Skin is warm and dry.     Coloration: Skin is not jaundiced or pale.     Findings: No bruising, erythema, lesion or rash.  Neurological:     General: No focal deficit present.     Mental Status: She is alert and oriented to person, place, and time. Mental status is at baseline.     Cranial Nerves: No cranial nerve deficit.     Sensory: No sensory deficit.     Motor: No weakness.     Coordination: Coordination normal.     Gait: Gait normal.     Deep Tendon Reflexes: Reflexes normal.  Psychiatric:        Mood and Affect: Mood normal.        Behavior: Behavior normal.        Thought Content: Thought content normal.        Judgment: Judgment normal.     LABS:      Latest Ref Rng & Units 06/14/2022    8:36 AM 04/06/2021   12:00 AM 04/06/2020    3:00 PM  CBC  WBC 4.0 - 10.5 K/uL 7.8  6.8  8.1   Hemoglobin 12.0 - 15.0 g/dL 11.9  12.7  11.7   Hematocrit 36.0 - 46.0 % 36.9  38  37.2   Platelets 150 - 400 K/uL 213  188  237       Latest Ref Rng & Units 06/14/2022    8:36 AM 04/06/2021   12:00 AM 12/01/2020    2:12 PM  CMP  Glucose 70 - 99 mg/dL 111   120   BUN 8 - 23 mg/dL '16  12  14   '$ Creatinine 0.44 - 1.00 mg/dL 0.92  0.7  0.79   Sodium 135 - 145 mmol/L 140  140  142   Potassium 3.5 - 5.1 mmol/L 5.1  3.5  3.9   Chloride 98 - 111 mmol/L 105  102  104   CO2 22 - 32 mmol/L 24  24  22  Calcium 8.9 - 10.3 mg/dL 8.9  9.3  9.4   Total Protein 6.5 - 8.1 g/dL 7.3     Total Bilirubin 0.3 - 1.2 mg/dL 0.7     Alkaline Phos 38 - 126 U/L 98  115    AST 15 - 41 U/L 23  20    ALT 0 - 44 U/L 15  16      Component Ref Range & Units 06/14/2022  CEA 0.0 - 4.7 ng/mL 2.8     Lab Results  Component Value Date   TIBC 467 (H) 04/06/2020   FERRITIN 12 04/06/2020   IRONPCTSAT 10 (L) 04/06/2020   Lab Results  Component Value Date   LDH 163 04/06/2020     STUDIES:  EXAM: 07/02/2022 PORTABLE CHEST 1 VIEW Impression:  No pneumothorax following bronchoscopy.       EXAM: 06/28/2022 CT CHEST WITHOUT CONTRAST Impression:  1. 14 x 13 mm peripheral right middle lobe pulmonary nodule is similar to minimally increased compared to recent lung cancer screening CT of 06/03/2022. Imaging features remain concerning for primary bronchogenic neoplasm. 2. No evidence for metastatic disease in the chest. 3. Small hiatal hernia. 4.  Aortic Atherosclerosis (ICD10-I70.0).  EXAM:06/12/22 PET SCAN IMPRESSION: Hypermetabolic RIGHT upper lobe pulmonary nodule most consistent with bronchogenic carcinoma. No evidence of metastatic adenopathy or distant metastatic disease.   EXAM:06/03/22 CT CHEST WITHOUT CONTRAST LOW DOSE FOR LUNG CANCER SCREENING IMPRESSION: New large irregular subpleural solid pulmonary nodule of the right middle lobe measuring 15.9 mm in mean diameter. Lung RADS 4B. Suspicious. Additional imaging evaluation or consultation with Pulmonology or Thoracic Surgery recommended.\\ Aortic Atherosclerosis and Emphysema.    Allergies: No Known Allergies  Current Medications: Current Outpatient Medications  Medication Sig Dispense Refill   apixaban (ELIQUIS) 5 MG TABS tablet Take 1 tablet (5 mg total) by mouth 2 (two) times daily. 180 tablet 3   atenolol (TENORMIN) 50 MG tablet Take 50 mg by mouth daily.     atorvastatin (LIPITOR) 80 MG tablet Take 1 tablet (80 mg total) by mouth daily. (Patient taking differently: Take 80 mg by mouth at bedtime.) 90 tablet 3   diltiazem (CARDIZEM) 30 MG tablet Take 1 tablet (30 mg total) by mouth every 6 (six) hours as needed (palpitations). 30 tablet  1   ibuprofen (ADVIL) 200 MG tablet Take 400 mg by mouth every 6 (six) hours as needed for headache.     montelukast (SINGULAIR) 10 MG tablet Take 10 mg by mouth at bedtime.     mupirocin ointment (BACTROBAN) 2 % Apply 1 Application topically 2 (two) times daily.     pantoprazole (PROTONIX) 40 MG tablet Take 40 mg by mouth daily.     sertraline (ZOLOFT) 100 MG tablet Take 150 mg by mouth daily.      torsemide (DEMADEX) 10 MG tablet Take 1 tablet (10 mg total) by mouth daily. 90 tablet 2   traMADol (ULTRAM) 50 MG tablet Take 1 tablet (50 mg total) by mouth every 6 (six) hours as needed for moderate pain. (Patient taking differently: Take 50 mg by mouth at bedtime.) 60 tablet 3   TRELEGY ELLIPTA 100-62.5-25 MCG/ACT AEPB Take 1 puff by mouth daily.     No current facility-administered medications for this visit.     ASSESSMENT & PLAN:   Assessment:   1.  Stage I breast cancer diagnosed in August 1999.  She remains without evidence of recurrence.  2.  Former smoker with COPD. It has been 9 years  since she quit smoking, after 35 pack years.  The annual low dose lung cancer screening CT scan has now found a probable lung cancer and so this emphasizes the benefit of the screening CT scans.  3.  Mild fibrocystic changes of the left breast. I think the pain she feels may be aggravated by increased caffeine.  4.  New pulmonary nodule measuring 15.9 cm and positive on PET scan. I explained this is squamous cell carcinoma originating from the lung and so needs to be treated with surgical resection. She will have an MRI of the brain to complete her staging and pulmonary function test to prepare for surgery. At her request I will refer her to a thoracic surgeon in [facility]. I will plan to see her post-op.    Plan: The lung cancer screening CT scan from 06/03/22 reveals new large irregular subpleural solid pulmonary nodule of the right middle lobe measuring 15.9 mm in mean diameter, Lung RADS 4B, and  suspicious. She then had a PET scan on 06/12/22 which reveals a hypermetabolic RIGHT upper lobe pulmonary nodule most consistent with bronchogenic carcinoma. She has now had her bronchoscopy and it revealed a squamous cell carcinoma consistent with origin in the lungs. There was no evidence of metastatic adenopathy or distant metastatic disease.  She will be referred to a thoracic surgeon and has requested that it be in [facility]. I will schedule MRI of the brain and pulmonary function tests as soon as possible. I will see her back post-op. I have discussed this at length with her and her husband, and answered their questions. They understand and agree with this plan of care.   I provided 30 minutes of face-to-face time during this this encounter and > 50% was spent counseling as documented under my assessment and plan.    Derwood Kaplan, MD [facility] [address] [place] [address] Dept: [phone] Dept Fax: [phone]     Sumner Boast Lassiter,acting as a scribe for Derwood Kaplan, MD.,have documented all relevant documentation on the behalf of Derwood Kaplan, MD,as directed by  Derwood Kaplan, MD while in the presence of Derwood Kaplan, MD.

## 2022-07-16 ENCOUNTER — Telehealth: Payer: Self-pay

## 2022-07-16 DIAGNOSIS — C3490 Malignant neoplasm of unspecified part of unspecified bronchus or lung: Secondary | ICD-10-CM

## 2022-07-16 DIAGNOSIS — J449 Chronic obstructive pulmonary disease, unspecified: Secondary | ICD-10-CM

## 2022-07-16 DIAGNOSIS — R911 Solitary pulmonary nodule: Secondary | ICD-10-CM

## 2022-07-16 NOTE — Telephone Encounter (Signed)
-----   Message from Derwood Kaplan, MD sent at 07/15/2022  7:52 PM EST ----- Regarding: refer New lung cancer, does not want to have anything else done at [facility] in [place] (they had [place]) She needs referral to Thoracic surgeon and requests [facility], has had good experience with [facility].  I don't know the surgeons there, used to be some that operated at both hospitals... Let me know what you find, be sure to include the path report in referral papers

## 2022-07-16 NOTE — Telephone Encounter (Signed)
Referral sent to [facility] in [facility] on [address].

## 2022-07-18 ENCOUNTER — Ambulatory Visit (HOSPITAL_COMMUNITY): Payer: Medicare Other

## 2022-07-22 ENCOUNTER — Other Ambulatory Visit: Payer: Self-pay

## 2022-07-22 DIAGNOSIS — R911 Solitary pulmonary nodule: Secondary | ICD-10-CM

## 2022-07-22 DIAGNOSIS — Z171 Estrogen receptor negative status [ER-]: Secondary | ICD-10-CM

## 2022-07-22 DIAGNOSIS — R918 Other nonspecific abnormal finding of lung field: Secondary | ICD-10-CM

## 2022-07-26 ENCOUNTER — Encounter: Payer: Medicare Other | Admitting: Thoracic Surgery (Cardiothoracic Vascular Surgery)

## 2022-07-26 ENCOUNTER — Encounter: Payer: Self-pay | Admitting: Oncology

## 2022-07-26 DIAGNOSIS — C3491 Malignant neoplasm of unspecified part of right bronchus or lung: Secondary | ICD-10-CM | POA: Insufficient documentation

## 2022-07-29 ENCOUNTER — Ambulatory Visit: Payer: Medicare Other | Attending: Cardiology | Admitting: Cardiology

## 2022-07-29 ENCOUNTER — Encounter: Payer: Self-pay | Admitting: Cardiology

## 2022-07-29 VITALS — BP 130/72 | HR 50 | Ht 68.0 in | Wt 172.0 lb

## 2022-07-29 DIAGNOSIS — Z171 Estrogen receptor negative status [ER-]: Secondary | ICD-10-CM | POA: Diagnosis present

## 2022-07-29 DIAGNOSIS — I6990 Unspecified sequelae of unspecified cerebrovascular disease: Secondary | ICD-10-CM | POA: Insufficient documentation

## 2022-07-29 DIAGNOSIS — C3491 Malignant neoplasm of unspecified part of right bronchus or lung: Secondary | ICD-10-CM | POA: Diagnosis present

## 2022-07-29 DIAGNOSIS — C50111 Malignant neoplasm of central portion of right female breast: Secondary | ICD-10-CM | POA: Insufficient documentation

## 2022-07-29 DIAGNOSIS — I480 Paroxysmal atrial fibrillation: Secondary | ICD-10-CM | POA: Insufficient documentation

## 2022-07-29 NOTE — Progress Notes (Unsigned)
Cardiology Office Note:    Date:  07/29/2022   ID:  Dana [name], DOB 07-28-1960, MRN [id number]  PCP:  Darrol Jump, PA-C  Cardiologist:  Jenne Campus, MD    Referring MD: Darrol Jump, PA-C   Chief Complaint  Patient presents with   Follow-up    History of Present Illness:    Dana [name] is a 62 y.o. female with past medical history significant for breast cancer diagnosed in August 1999, recently diagnosed bronchogenic lung cancer on the right side, paroxysmal atrial fibrillation with CHA2DS2-VASc equals 3 which is for woman as well as history of CVA, she is anticoag with Eliquis.  She did have coronary CT angiogram in 2022 with calcium score of 54.6 with minimal nonobstructive disease involving proximal LAD, additional problem include dyslipidemia, history of smoking however she quit 9 years ago. He comes to my office for follow-up she describes 1 episode of atrial fibrillation since I seen her last time lasting for about an hour or so.  She took Cardizem for rate and and palpitations stopped.  She is being anticoagulated and takes this religiously.  Past Medical History:  Diagnosis Date   Asthma    Atypical chest pain 03/25/2016   Breast cancer ([place])    COPD (chronic obstructive pulmonary disease) ([place])    Dizziness 08/18/2017   Hx of adenomatous polyp of colon 08/09/2009   Hyperthyroidism 01/04/2016   Late effects of CVA (cerebrovascular accident) 03/25/2016   Lymphedema of right upper extremity 08/09/2020   Malignant neoplasm of central portion of breast in female, estrogen receptor negative ([place]) 03/25/2016   Obstructive sleep apnea syndrome 03/25/2016   Paroxysmal atrial fibrillation ([place]) 03/25/2016   Precordial chest pain 08/16/2020    Past Surgical History:  Procedure Laterality Date   ABDOMINAL HYSTERECTOMY     APPENDECTOMY     BREAST BIOPSY     BRONCHIAL BIOPSY  07/02/2022   Procedure: BRONCHIAL BIOPSIES;  Surgeon: Garner Nash, DO;   Location: [facility];  Service: Pulmonary;;   BRONCHIAL NEEDLE ASPIRATION BIOPSY  07/02/2022   Procedure: BRONCHIAL NEEDLE ASPIRATION BIOPSIES;  Surgeon: Garner Nash, DO;  Location: [facility];  Service: Pulmonary;;   FINE NEEDLE ASPIRATION  07/02/2022   Procedure: FINE NEEDLE ASPIRATION (FNA) LINEAR;  Surgeon: Garner Nash, DO;  Location: [facility];  Service: Pulmonary;;   lymph node removal     PORTACATH PLACEMENT     portacath removal     TONSILLECTOMY     VIDEO BRONCHOSCOPY WITH ENDOBRONCHIAL ULTRASOUND  07/02/2022   Procedure: VIDEO BRONCHOSCOPY WITH ENDOBRONCHIAL ULTRASOUND;  Surgeon: Garner Nash, DO;  Location: [facility];  Service: Pulmonary;;    Current Medications: Current Meds  Medication Sig   apixaban (ELIQUIS) 5 MG TABS tablet Take 1 tablet (5 mg total) by mouth 2 (two) times daily.   atenolol (TENORMIN) 50 MG tablet Take 50 mg by mouth daily.   atorvastatin (LIPITOR) 80 MG tablet Take 1 tablet (80 mg total) by mouth daily.   diltiazem (CARDIZEM) 30 MG tablet Take 1 tablet (30 mg total) by mouth every 6 (six) hours as needed (palpitations).   ibuprofen (ADVIL) 200 MG tablet Take 400 mg by mouth every 6 (six) hours as needed for headache.   montelukast (SINGULAIR) 10 MG tablet Take 10 mg by mouth at bedtime.   pantoprazole (PROTONIX) 40 MG tablet Take 40 mg by mouth daily.   sertraline (ZOLOFT) 100 MG tablet Take 150 mg by mouth daily.  torsemide (DEMADEX) 10 MG tablet Take 1 tablet (10 mg total) by mouth daily.   traMADol (ULTRAM) 50 MG tablet Take 1 tablet (50 mg total) by mouth every 6 (six) hours as needed for moderate pain.   TRELEGY ELLIPTA 100-62.5-25 MCG/ACT AEPB Take 1 puff by mouth daily.     Allergies:   Patient has no known allergies.   Social History   Socioeconomic History   Marital status: Married    Spouse name: Not on file   Number of children: Not on file   Years of education: Not on file   Highest education level: Not on file   Occupational History   Not on file  Tobacco Use   Smoking status: Former    Types: Cigarettes    Quit date: 02/24/2013    Years since quitting: 9.4   Smokeless tobacco: Never  Vaping Use   Vaping Use: Never used  Substance and Sexual Activity   Alcohol use: Yes    Comment: Once a year   Drug use: No   Sexual activity: Not on file  Other Topics Concern   Not on file  Social History Narrative   Not on file   Social Determinants of Health   Financial Resource Strain: Not on file  Food Insecurity: Not on file  Transportation Needs: Not on file  Physical Activity: Not on file  Stress: Not on file  Social Connections: Not on file     Family History: The patient's family history includes Hypertension in her maternal grandmother and mother; Lung cancer in her maternal uncle; Ovarian cancer in her paternal aunt and paternal grandfather. ROS:   Please see the history of present illness.    All 14 point review of systems negative except as described per history of present illness  EKGs/Labs/Other Studies Reviewed:      Recent Labs: 06/14/2022: ALT 15; BUN 16; Creatinine 0.92; Hemoglobin 11.9; Platelet Count 213; Potassium 5.1; Sodium 140  Recent Lipid Panel    Component Value Date/Time   CHOL 153 08/21/2021 1309   TRIG 121 08/21/2021 1309   HDL 44 08/21/2021 1309   CHOLHDL 3.5 08/21/2021 1309   LDLCALC 87 08/21/2021 1309    Physical Exam:    VS:  BP 130/72 (BP Location: Left Arm, Patient Position: Sitting, Cuff Size: Normal)   Pulse (!) 50   Ht '5\' 8"'$  (1.727 m)   Wt 172 lb (78 kg)   SpO2 97%   BMI 26.15 kg/m     Wt Readings from Last 3 Encounters:  07/29/22 172 lb (78 kg)  07/02/22 173 lb 3.2 oz (78.6 kg)  06/18/22 173 lb 3.2 oz (78.6 kg)     GEN:  Well nourished, well developed in no acute distress HEENT: Normal NECK: No JVD; No carotid bruits LYMPHATICS: No lymphadenopathy CARDIAC: RRR, no murmurs, no rubs, no gallops RESPIRATORY:  Clear to auscultation  without rales, wheezing or rhonchi  ABDOMEN: Soft, non-tender, non-distended MUSCULOSKELETAL:  No edema; No deformity  SKIN: Warm and dry LOWER EXTREMITIES: no swelling NEUROLOGIC:  Alert and oriented x 3 PSYCHIATRIC:  Normal affect   ASSESSMENT:    1. Paroxysmal atrial fibrillation (HCC)   2. Bronchogenic lung cancer, right ([place])   3. Late effects of CVA (cerebrovascular accident)   4. Malignant neoplasm of central portion of right breast in female, estrogen receptor negative ([place])    PLAN:    In order of problems listed above:  Paroxysmal atrial fibrillation very rare episodes.  Only AV  suppressant agent has been used as well as anticoagulation.  I told her if she will have more recurrences of this I recommend an antiarrhythmic therapy will be considered. Bronchogenic lung cancer which is a new diagnosis.  Look like it is stage I.  She most likely will require chest surgery no radiation or chemotherapy but everything depends on pathology that we will obtain obviously after surgery. Late effect of CVA.  Stable no new issues.  Anticoagulated. History of breast cancer.  Noted. I did review her MRI of the brain which shows some small vessel disease.  She is not on antiplatelet therapy because of anticoagulation, she is however on statin with K PN showing LDL of 65 HDL 51 will continue present management. From my point of view she should be a good candidate for thoracotomy with wedge resection of her lesion   Medication Adjustments/Labs and Tests Ordered: Current medicines are reviewed at length with the patient today.  Concerns regarding medicines are outlined above.  No orders of the defined types were placed in this encounter.  Medication changes: No orders of the defined types were placed in this encounter.   Signed, Park Liter, MD, [facility], [place] 07/29/2022 3:21 PM    [place]

## 2022-07-29 NOTE — Patient Instructions (Signed)

## 2022-08-07 NOTE — Progress Notes (Signed)
done

## 2022-08-08 ENCOUNTER — Encounter: Payer: Self-pay | Admitting: Oncology

## 2022-08-08 NOTE — Progress Notes (Signed)
[address]       [place] [address]             [phone]                    Ira P Voller  Medical Record [id number] Date of Birth: [birth year]  Referring: Magdalen Spatz, NP Primary Care: Darrol Jump, [name] Primary Cardiologist: None  Chief Complaint:    Chief Complaint  Patient presents with   Lung Cancer    New patient consultation PET 1/17, chest CT 1/10, 2/2, Bronch 2/6, PFTs 2/18    History of Present Illness:    Dana [name] 62 y.o. female presents for surgical evaluation of a biopsy-proven right middle lobe non-small cell lung cancer.  She has a previous history of smoking, as well as a history of breast cancer.  Cross-sectional imaging identified this nodule.  She denies any shortness of breath, coughing, chest pain, neurologic symptoms, or weight changes.  She does have a history of atrial fibrillation and has been on Eliquis.  She also has a history of multiple pulmonary embolisms.  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      Smoking Hx: Quit 9 years ago   Zubrod Score: At the time of surgery this patient's most appropriate activity status/level should be described as: [x]     0    Normal activity, no symptoms []     1    Restricted in physical strenuous activity but ambulatory, able to do out light work []     2    Ambulatory and capable of self care, unable to do work activities, up and about               >50 % of waking hours                              []     3    Only limited self care, in bed greater than 50% of waking hours []     4    Completely disabled, no self care, confined to bed or chair []     5    Moribund   Past Medical History:  Diagnosis Date   Asthma    Atypical chest pain 03/25/2016   Breast cancer ([place])    COPD (chronic obstructive pulmonary disease) ([place])    Dizziness 08/18/2017   Hx of adenomatous polyp of colon 08/09/2009   Hyperthyroidism 01/04/2016   Late effects of CVA (cerebrovascular  accident) 03/25/2016   Lymphedema of right upper extremity 08/09/2020   Malignant neoplasm of central portion of breast in female, estrogen receptor negative ([place]) 03/25/2016   Obstructive sleep apnea syndrome 03/25/2016   Paroxysmal atrial fibrillation ([place]) 03/25/2016   Precordial chest pain 08/16/2020    Past Surgical History:  Procedure Laterality Date   ABDOMINAL HYSTERECTOMY     APPENDECTOMY     BREAST BIOPSY     BRONCHIAL BIOPSY  07/02/2022   Procedure: BRONCHIAL BIOPSIES;  Surgeon: Garner Nash, DO;  Location: [facility];  Service: Pulmonary;;   BRONCHIAL NEEDLE ASPIRATION BIOPSY  07/02/2022   Procedure: BRONCHIAL NEEDLE ASPIRATION BIOPSIES;  Surgeon: Garner Nash, DO;  Location: [facility];  Service: Pulmonary;;   FINE NEEDLE ASPIRATION  07/02/2022   Procedure: FINE NEEDLE ASPIRATION (FNA) LINEAR;  Surgeon: Garner Nash, DO;  Location: [facility];  Service: Pulmonary;;   lymph  node removal     PORTACATH PLACEMENT     portacath removal     TONSILLECTOMY     VIDEO BRONCHOSCOPY WITH ENDOBRONCHIAL ULTRASOUND  07/02/2022   Procedure: VIDEO BRONCHOSCOPY WITH ENDOBRONCHIAL ULTRASOUND;  Surgeon: Garner Nash, DO;  Location: [facility];  Service: Pulmonary;;    Family History  Problem Relation Age of Onset   Hypertension Maternal Grandmother    Lung cancer Maternal Uncle    Hypertension Mother    Ovarian cancer Paternal Aunt    Ovarian cancer Paternal Grandfather      Social History   Tobacco Use  Smoking Status Former   Types: Cigarettes   Quit date: 02/24/2013   Years since quitting: 9.4  Smokeless Tobacco Never    Social History   Substance and Sexual Activity  Alcohol Use Yes   Comment: Once a year     No Known Allergies  Current Outpatient Medications  Medication Sig Dispense Refill   apixaban (ELIQUIS) 5 MG TABS tablet Take 1 tablet (5 mg total) by mouth 2 (two) times daily. 180 tablet 3   atenolol (TENORMIN) 50 MG tablet Take 50 mg by  mouth daily.     diltiazem (CARDIZEM) 30 MG tablet Take 1 tablet (30 mg total) by mouth every 6 (six) hours as needed (palpitations). 30 tablet 1   ibuprofen (ADVIL) 200 MG tablet Take 400 mg by mouth every 6 (six) hours as needed for headache.     montelukast (SINGULAIR) 10 MG tablet Take 10 mg by mouth at bedtime.     pantoprazole (PROTONIX) 40 MG tablet Take 40 mg by mouth daily.     sertraline (ZOLOFT) 100 MG tablet Take 150 mg by mouth daily.      torsemide (DEMADEX) 10 MG tablet Take 1 tablet (10 mg total) by mouth daily. 90 tablet 2   traMADol (ULTRAM) 50 MG tablet Take 1 tablet (50 mg total) by mouth every 6 (six) hours as needed for moderate pain. 60 tablet 3   TRELEGY ELLIPTA 100-62.5-25 MCG/ACT AEPB Take 1 puff by mouth daily.     atorvastatin (LIPITOR) 80 MG tablet Take 1 tablet (80 mg total) by mouth daily. 90 tablet 3   No current facility-administered medications for this visit.    Review of Systems  Constitutional:  Negative for malaise/fatigue and weight loss.  Respiratory:  Negative for cough and shortness of breath.   Cardiovascular:  Negative for chest pain.  Neurological:  Negative for dizziness.     PHYSICAL EXAMINATION: BP 137/70 (BP Location: Left Arm, Patient Position: Sitting, Cuff Size: Normal)   Pulse 60   Resp 20   Ht 5\' 8"  (1.727 m)   Wt 175 lb (79.4 kg)   SpO2 92%   BMI 26.61 kg/m  Physical Exam Constitutional:      Appearance: Normal appearance. She is normal weight. She is not ill-appearing.  Eyes:     Extraocular Movements: Extraocular movements intact.  Cardiovascular:     Rate and Rhythm: Normal rate.  Pulmonary:     Effort: Pulmonary effort is normal. No respiratory distress.  Abdominal:     General: Abdomen is flat.  Musculoskeletal:        General: Normal range of motion.     Cervical back: Normal range of motion.  Skin:    General: Skin is warm and dry.  Neurological:     General: No focal deficit present.     Mental Status: She  is alert and oriented to person,  place, and time.     Diagnostic Studies & Laboratory data:         I have independently reviewed the above radiology studies  and reviewed the findings with the patient.   Recent Lab Findings: Lab Results  Component Value Date   WBC 7.8 06/14/2022   HGB 11.9 (L) 06/14/2022   HCT 36.9 06/14/2022   PLT 213 06/14/2022   GLUCOSE 111 (H) 06/14/2022   CHOL 153 08/21/2021   TRIG 121 08/21/2021   HDL 44 08/21/2021   LDLCALC 87 08/21/2021   ALT 15 06/14/2022   AST 23 06/14/2022   NA 140 06/14/2022   K 5.1 06/14/2022   CL 105 06/14/2022   CREATININE 0.92 06/14/2022   BUN 16 06/14/2022   CO2 24 06/14/2022   TSH 1.10 04/06/2021     PFTs:  - FVC: 87% - FEV1: 75% -DLCO: 56%    FINAL MICROSCOPIC DIAGNOSIS:  A. LUNG, RML, FINE NEEDLE ASPIRATION:  Non-small cell carcinoma    Assessment / Plan:   62yo female with RML NSCLC.  Marginal PFTs based off of DLCO.  Risks and benefits discussed include possibility of supplemental O2 requirements.  She should be fine given that only the right middle lobe require resection.  She has had radiation to her right chest wall.  There does appear to be some changes noted on cross-sectional imaging.  She is hesitant to proceed with any surgery.  She would like to discuss options for radiation therapy.  Of note if she decides to go down the surgical path she is a Sales promotion account executive [employer], and refuses all blood transfusions.     I  spent 40 minutes with  the patient face to face in counseling and coordination of care.    Lajuana Matte 08/09/2022 3:27 PM

## 2022-08-09 ENCOUNTER — Encounter: Payer: Self-pay | Admitting: Thoracic Surgery (Cardiothoracic Vascular Surgery)

## 2022-08-09 ENCOUNTER — Institutional Professional Consult (permissible substitution) (INDEPENDENT_AMBULATORY_CARE_PROVIDER_SITE_OTHER): Payer: Medicare Other | Admitting: Thoracic Surgery (Cardiothoracic Vascular Surgery)

## 2022-08-09 ENCOUNTER — Telehealth: Payer: Self-pay

## 2022-08-09 ENCOUNTER — Encounter: Payer: Medicare Other | Admitting: Thoracic Surgery (Cardiothoracic Vascular Surgery)

## 2022-08-09 VITALS — BP 137/70 | HR 60 | Resp 20 | Ht 68.0 in | Wt 175.0 lb

## 2022-08-09 DIAGNOSIS — R911 Solitary pulmonary nodule: Secondary | ICD-10-CM

## 2022-08-09 NOTE — Telephone Encounter (Signed)
Patient called to let us know that she saw the thoracic surgeon and he gave her two choices.One of the choices was radiation. She wanted to know if we had the radiation for her near [place].

## 2022-08-13 ENCOUNTER — Other Ambulatory Visit: Payer: Self-pay

## 2022-08-13 ENCOUNTER — Encounter: Payer: Self-pay | Admitting: Oncology

## 2022-08-13 ENCOUNTER — Inpatient Hospital Stay: Payer: Medicare Other | Attending: Oncology | Admitting: Oncology

## 2022-08-13 DIAGNOSIS — C3491 Malignant neoplasm of unspecified part of right bronchus or lung: Secondary | ICD-10-CM

## 2022-08-13 NOTE — Progress Notes (Signed)
[facility]  [address] [place],  [place]  [address] [phone] Telehealth visit:  I connected with Nima Gassen on August 13, 2022 at 1245 by telephone and verified that I am speaking with the correct patient using 2 identifiers.  Location: The patient is at her home and accompanied by her husband, already [place].                   I am in my office at the [facility]  I discussed the limitations, risks, security and privacy concerns of performing an evaluation and management service by telemedicine and the availability of in person appointments.  I also discussed with the patient and her husband that there may be a patient responsible charge related to this service.  The patient expressed understanding and agreed to proceed.    Clinic Day:08/13/22    Referring physician: Darrol Jump, PA    CHIEF COMPLAINT:  CC: History of stage I breast cancer, new lung cancer 2024  Current Treatment:  Surveillance, Evaluation   HISTORY OF PRESENT ILLNESS:  MELAINIE [name] is a 62 y.o. female with a history of stage I breast cancer diagnosed in August 1999.  This was a 1.1 cm invasive ductal carcinoma, treated with lumpectomy, CMF chemotherapy, and radiation.  She did not tolerate tamoxifen, but has never had evidence of recurrence.  We began seeing her in August 2004, when she moved to the area.  She is on yearly follow-up, but was last seen in August of 2016.  We did CT scans in April of 2016, and found no evidence of malignancy, but there was a 6 mm nodule in the posterior right upper lobe on the CT chest.  She was seen in July with CT chest to follow up on the lung nodule, and it had resolved.  Due to her personal history of breast cancer diagnosed at age 64, we have recommended genetic testing with the [facility] Hereditary Cancer Panel testing on July 11th, 2016 with [place] and that result was negative.   She has had a total abdominal hysterectomy for dysfunctional uterine bleeding, but her ovaries are intact.  She states her last colonoscopy was in 2011, and she is due for this again. She had 2 polyps removed at that time, one of which was a tubular adenoma.  She also has atrial fibrillation, history of pneumonia, mild to moderate tricuspid regurgitation, and history of prior stroke in 2014.  I had seen her in October 2017 and we evaluated multiple issues that she was dealing with.  She is off the Prilosec and now on Protonix 40 mg daily and also on vitamin D2 1.25 mg weekly, in addition to her usual medications.  She had a squamous cell carcinoma removed from her right shoulder, and a squamous cell carcinoma in situ removed from the left anterior chest.  CT imaging from September 2021 revealed stable, benign small pulmonary nodules.  She is on annual low-dose CT lung cancer screening due to his smoking history of 1 pack/day for 35 years.  She did quit 9 years ago.   She does have a history of peptic ulcer disease.  She has had some anxiety and depression.  INTERVAL HISTORY:  Dana [name] was here with her husband for his visit recently, and we discussed her recent developments. The lung cancer screening CT scan from 06/03/22 reveals a new large irregular subpleural solid pulmonary nodule of the right middle lobe  measuring 15.9 mm in mean diameter, for a lung RADS 4B, suspicious. She then had a PET scan on 06/12/22 which reveals a hypermetabolic RIGHT upper lobe pulmonary nodule most consistent with bronchogenic carcinoma. There was no evidence of metastatic adenopathy or distant metastatic disease. She has now been seen by a pulmonologist, Dr. Valeta Harms, and her bronchoscopy revealed a squamous cell carcinoma. He made a referral to a chest surgeon but she has canceled the appointment until she spoke with me. I emphasized that the best course of action is surgical resection and she has now seen Dr. Kandice Hams in  consultation.  He discussed her options of surgery versus radiation and she wanted to know if the radiation could be done here in [place].  MRI of the brain was clear  Pulmonary function tests reveals a moderate degree of COPD with an FEV1 of 2.09 L, 75% of predicted.. We discussed the diagnoses and likelihood that this is a stage I, so she is not likely to need further treatment after surgery.  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  I do not think she would have a great deal of blood loss and her pulmonary function tests show she could tolerate a lobectomy but I understand her hesitation to undergo thoracic surgery.  She is also concerned because she is the sole caregiver for her elderly mother and might be unable to provide her care in the perioperative period.  I explained that if she wishes to avoid surgery, stereotactic radiation would be the smarter way to go and we cannot perform that here but I can refer her to radiation oncology at [facility], part of our [facility] system.  We will put her on surveillance after that.  I discussed the risks and benefits of all approaches and answered their questions.  REVIEW OF SYSTEMS:  Review of Systems  Constitutional: Negative.  Negative for appetite change, chills, diaphoresis, fatigue, fever and unexpected weight change.  HENT:  Negative.  Negative for hearing loss, lump/mass, mouth sores, nosebleeds, sore throat, tinnitus, trouble swallowing and voice change.   Eyes: Negative.  Negative for eye problems and icterus.  Respiratory: Negative.  Negative for chest tightness, cough, hemoptysis, shortness of breath and wheezing.   Cardiovascular: Negative.  Negative for chest pain, leg swelling and palpitations.  Gastrointestinal: Negative.  Negative for abdominal distention, abdominal pain, blood in stool, constipation,  diarrhea, nausea, rectal pain and vomiting.  Endocrine: Negative.   Genitourinary: Negative.  Negative for bladder incontinence, difficulty urinating, dyspareunia, dysuria, frequency, hematuria, menstrual problem, nocturia, pelvic pain, vaginal bleeding and vaginal discharge.   Musculoskeletal:  Positive for arthralgias (generalized) and myalgias (generalized). Negative for back pain, flank pain, gait problem, neck pain and neck stiffness.  Skin: Negative.  Negative for itching, rash and wound.  Neurological: Negative.  Negative for dizziness, extremity weakness, gait problem, headaches, light-headedness, numbness, seizures and speech difficulty.  Hematological: Negative.  Negative for adenopathy. Does not bruise/bleed easily.  Psychiatric/Behavioral:  Positive for sleep disturbance (due to generalized pain). Negative for confusion, decreased concentration, depression and suicidal ideas. The patient is not nervous/anxious.      VITALS:  There were no vitals taken for this visit.  Wt Readings from Last 3 Encounters:  08/09/22 175 lb (79.4 kg)  07/29/22 172 lb (78 kg)  07/02/22 173 lb 3.2 oz (78.6 kg)    There is no height  or weight on file to calculate BMI.  Performance status (ECOG): 1 - Symptomatic but completely ambulatory  PHYSICAL EXAM:  Physical exam was deferred in order to discuss the pathology findings and answer her questions.  Physical Exam Vitals and nursing note reviewed. Exam conducted with a chaperone present.  Constitutional:      General: She is not in acute distress.    Appearance: Normal appearance. She is normal weight. She is not ill-appearing, toxic-appearing or diaphoretic.  HENT:     Head: Normocephalic and atraumatic.     Right Ear: Tympanic membrane, ear canal and external ear normal. There is no impacted cerumen.     Left Ear: Tympanic membrane, ear canal and external ear normal. There is no impacted cerumen.     Nose: Nose normal. No congestion or  rhinorrhea.     Mouth/Throat:     Mouth: Mucous membranes are moist.     Pharynx: Oropharynx is clear. No oropharyngeal exudate or posterior oropharyngeal erythema.  Eyes:     General: No scleral icterus.       Right eye: No discharge.        Left eye: No discharge.     Extraocular Movements: Extraocular movements intact.     Conjunctiva/sclera: Conjunctivae normal.     Pupils: Pupils are equal, round, and reactive to light.  Neck:     Vascular: No carotid bruit.  Cardiovascular:     Rate and Rhythm: Normal rate and regular rhythm.     Pulses: Normal pulses.     Heart sounds: Normal heart sounds. No murmur heard.    No friction rub. No gallop.  Pulmonary:     Effort: Pulmonary effort is normal. No respiratory distress.     Breath sounds: Normal breath sounds. No stridor. No wheezing, rhonchi or rales.     Comments: Scars on her back from moles being removed, the upper right back. Chest:     Chest wall: No tenderness.  Breasts:    Right: Normal.     Left: Normal.     Comments: Scar in the right breast at 3 o'clock which is barely visible. Scar in the right axilla which is healed. Faint scars in the upper left axilla consistent with healed hidradenitis.  Abdominal:     General: Bowel sounds are normal. There is no distension.     Palpations: Abdomen is soft. There is no hepatomegaly, splenomegaly or mass.     Tenderness: There is no abdominal tenderness. There is no right CVA tenderness, left CVA tenderness, guarding or rebound.     Hernia: No hernia is present.  Musculoskeletal:        General: No swelling, tenderness or deformity. Normal range of motion.     Cervical back: Normal range of motion and neck supple. No rigidity or tenderness.     Right lower leg: No edema.     Left lower leg: No edema.  Lymphadenopathy:     Cervical: No cervical adenopathy.  Skin:    General: Skin is warm and dry.     Coloration: Skin is not jaundiced or pale.     Findings: No bruising,  erythema, lesion or rash.  Neurological:     General: No focal deficit present.     Mental Status: She is alert and oriented to person, place, and time. Mental status is at baseline.     Cranial Nerves: No cranial nerve deficit.     Sensory: No sensory deficit.     Motor:  No weakness.     Coordination: Coordination normal.     Gait: Gait normal.     Deep Tendon Reflexes: Reflexes normal.  Psychiatric:        Mood and Affect: Mood normal.        Behavior: Behavior normal.        Thought Content: Thought content normal.        Judgment: Judgment normal.     LABS:      Latest Ref Rng & Units 06/14/2022    8:36 AM 04/06/2021   12:00 AM 04/06/2020    3:00 PM  CBC  WBC 4.0 - 10.5 K/uL 7.8  6.8  8.1   Hemoglobin 12.0 - 15.0 g/dL 11.9  12.7  11.7   Hematocrit 36.0 - 46.0 % 36.9  38  37.2   Platelets 150 - 400 K/uL 213  188  237       Latest Ref Rng & Units 06/14/2022    8:36 AM 04/06/2021   12:00 AM 12/01/2020    2:12 PM  CMP  Glucose 70 - 99 mg/dL 111   120   BUN 8 - 23 mg/dL 16  12  14    Creatinine 0.44 - 1.00 mg/dL 0.92  0.7  0.79   Sodium 135 - 145 mmol/L 140  140  142   Potassium 3.5 - 5.1 mmol/L 5.1  3.5  3.9   Chloride 98 - 111 mmol/L 105  102  104   CO2 22 - 32 mmol/L 24  24  22    Calcium 8.9 - 10.3 mg/dL 8.9  9.3  9.4   Total Protein 6.5 - 8.1 g/dL 7.3     Total Bilirubin 0.3 - 1.2 mg/dL 0.7     Alkaline Phos 38 - 126 U/L 98  115    AST 15 - 41 U/L 23  20    ALT 0 - 44 U/L 15  16     Component Ref Range & Units 06/14/2022  CEA 0.0 - 4.7 ng/mL 2.8     Lab Results  Component Value Date   TIBC 467 (H) 04/06/2020   FERRITIN 12 04/06/2020   IRONPCTSAT 10 (L) 04/06/2020   Lab Results  Component Value Date   LDH 163 04/06/2020     STUDIES:  EXAM:07/25/22 DIGITAL SCREENING BILATERAL MAMMO WITH TOMO AND CAD IMPRESSION: No mammographic evidence of malignancy  EXAM: 07/22/22 MRI HEAD WITHOUT AND WITH CONTRAST IMPRESSION: No evidence of intracranial  metastases Mild chronic small vessel ischemic disease   EXAM: 07/02/2022 PORTABLE CHEST 1 VIEW Impression:  No pneumothorax following bronchoscopy.       EXAM: 06/28/2022 CT CHEST WITHOUT CONTRAST Impression:  1. 14 x 13 mm peripheral right middle lobe pulmonary nodule is similar to minimally increased compared to recent lung cancer screening CT of 06/03/2022. Imaging features remain concerning for primary bronchogenic neoplasm. 2. No evidence for metastatic disease in the chest. 3. Small hiatal hernia. 4.  Aortic Atherosclerosis (ICD10-I70.0).  EXAM:06/12/22 PET SCAN IMPRESSION: Hypermetabolic RIGHT upper lobe pulmonary nodule most consistent with bronchogenic carcinoma. No evidence of metastatic adenopathy or distant metastatic disease.   EXAM:06/03/22 CT CHEST WITHOUT CONTRAST LOW DOSE FOR LUNG CANCER SCREENING IMPRESSION: New large irregular subpleural solid pulmonary nodule of the right middle lobe measuring 15.9 mm in mean diameter. Lung RADS 4B. Suspicious. Additional imaging evaluation or consultation with Pulmonology or Thoracic Surgery recommended.\\ Aortic Atherosclerosis and Emphysema.    Allergies: No Known Allergies  Current Medications: Current Outpatient Medications  Medication Sig Dispense Refill   apixaban (ELIQUIS) 5 MG TABS tablet Take 1 tablet (5 mg total) by mouth 2 (two) times daily. 180 tablet 3   atenolol (TENORMIN) 50 MG tablet Take 50 mg by mouth daily.     atorvastatin (LIPITOR) 80 MG tablet Take 1 tablet (80 mg total) by mouth daily. 90 tablet 3   diltiazem (CARDIZEM) 30 MG tablet Take 1 tablet (30 mg total) by mouth every 6 (six) hours as needed (palpitations). 30 tablet 1   ibuprofen (ADVIL) 200 MG tablet Take 400 mg by mouth every 6 (six) hours as needed for headache.     montelukast (SINGULAIR) 10 MG tablet Take 10 mg by mouth at bedtime.     pantoprazole (PROTONIX) 40 MG tablet Take 40 mg by mouth daily.     sertraline (ZOLOFT) 100 MG  tablet Take 150 mg by mouth daily.      torsemide (DEMADEX) 10 MG tablet Take 1 tablet (10 mg total) by mouth daily. 90 tablet 2   traMADol (ULTRAM) 50 MG tablet Take 1 tablet (50 mg total) by mouth every 6 (six) hours as needed for moderate pain. 60 tablet 3   TRELEGY ELLIPTA 100-62.5-25 MCG/ACT AEPB Take 1 puff by mouth daily.     No current facility-administered medications for this visit.     ASSESSMENT & PLAN:   Assessment:   1.  Stage I breast cancer diagnosed in August 1999.  She remains without evidence of recurrence.  2.  Former smoker with COPD. It has been 9 years since she quit smoking, after 35 pack years.  The annual low dose lung cancer screening CT scan has now found a lung cancer and this appears to be a stage IA2 (T1b N0 M0) with no adenopathy and pathology reveals a squamous cell carcinoma.  Pulmonary function test revealed moderate obstruction with an FEV1 of 2.09 L, 75% of predicted.  3.  Non-small cell lung cancer measuring 15.9 mm and positive on PET but this appears to be a stage I lesion.  Pathology reveals a squamous cell carcinoma  We have just discussed the risks and benefits of surgery versus stereotactic radiation.  She would like consultation with radiation oncology to discuss this further.  I think she would be a good candidate for this.  She does have some degree of COPD, felt to be mild to moderate based on recent pulmonary function tests.     Plan: The lung cancer screening CT scan from 06/03/22 reveals new large irregular subpleural solid pulmonary nodule of the right middle lobe measuring 15.9 mm in mean diameter, Lung RADS 4B.  She then had a PET scan on 06/12/22 which reveals a hypermetabolic RIGHT upper lobe pulmonary nodule most consistent with bronchogenic carcinoma. She has now had her bronchoscopy and it revealed a squamous cell carcinoma consistent with origin in the lungs and consistent with stage IA2 disease (T1b N0 M0). There was no evidence of  metastatic adenopathy or distant metastatic disease.  She was referred to  Dr. Kipp Brood, a thoracic surgeon.  He is concerned about her pulmonary function tests.  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  I do not think she would have a great deal of blood loss and her pulmonary function tests show she could tolerate a lobectomy but I understand her hesitation to undergo thoracic surgery.  I explained that if she wishes to avoid surgery,  stereotactic radiation would be the smarter way to go and we cannot perform that here but I can refer her to radiation oncology at [facility], part of our [facility] system.  We will put her on surveillance after that.  I discussed the risks and benefits of all approaches and answered their questions.  I have discussed this at length with her and her husband, and they understand and agree with this plan of care.   I provided 25 minutes of time on the telephone during this this encounter.    Derwood Kaplan, MD [facility] [address] [place] [address] Dept: [phone] Dept Fax: [phone]

## 2022-08-14 ENCOUNTER — Telehealth: Payer: Self-pay

## 2022-08-14 ENCOUNTER — Inpatient Hospital Stay
Admission: RE | Admit: 2022-08-14 | Discharge: 2022-08-14 | Disposition: A | Discharge status: Home / Self Care | Payer: Self-pay | Source: Ambulatory Visit | Attending: Radiation Oncology | Admitting: Radiation Oncology

## 2022-08-14 ENCOUNTER — Other Ambulatory Visit: Payer: Self-pay | Admitting: Radiation Oncology

## 2022-08-14 DIAGNOSIS — C3490 Malignant neoplasm of unspecified part of unspecified bronchus or lung: Secondary | ICD-10-CM

## 2022-08-14 NOTE — Progress Notes (Signed)
Thoracic Location of Tumor / Histology: Right Middle Lobe Lung  Patient presented for annual low dose CT lung cancer screening due to 35 year smoking history.    MRI Brain:    Bronchoscopy 07/02/2022:  CT Super D Chest 06/28/2022:  14 x 13 mm peripheral right middle lobe pulmonary nodule is similar to minimally increased compared to recent lung cancer screening CT of 06/03/2022. Imaging features remain concerning for primary bronchogenic neoplasm.  No evidence for metastatic disease in the chest.  PET [id number]: Hypermetabolic right upper lobe pulmonary nodule most consistent with bronchogenic carcinoma.  No evidence of metastatic adenopathy or distant metastatic disease.  CT Chest 06/03/2022: New large irregular subpleural solid pulmonary nodule of the right middle lobe measuring 15.9 mm in mean diameter.   Biopsies of Right Middle Lobe and Lymph Node 07/02/2022    Tobacco/Marijuana/Snuff/ETOH use: Former Smoker, quit 2014.  Past/Anticipated interventions by cardiothoracic surgery, if any:  Dr. Kipp Brood 08/09/2022 -She should be fine given that only the right middle lobe require resection.   -She has had radiation to her right chest wall.  -She is hesitant to proceed with any surgery.  She would like to discuss options for radiation therapy.   -Of note if she decides to go down the surgical path she is a Sales promotion account executive [employer], and refuses all blood transfusions.      Past/Anticipated interventions by medical oncology, if any:  Dr. Hinton Rao 08/13/2022 -She will be referred to a thoracic surgeon and has requested that it be in [facility].  -I will schedule MRI of the brain and pulmonary function tests as soon as possible.  -I will see her back post-op.    Signs/Symptoms Weight changes, if any: No Respiratory complaints, if any: She reports feeling winded on occasion, with activity or working in cooler air. Hemoptysis, if any: She reports occasional productive cough with clear mucus.  Denies  hemoptysis. Pain issues, if any: She reports occasional chest pressure and tightness.  She was seen by cardiology and cleared.   SAFETY ISSUES: Prior radiation? 2000, Right Breast 30 fractions, [place] with Dr. Lidia Collum. Pacemaker/ICD? No  Possible current pregnancy? Hysterectomy Is the patient on methotrexate? No  Current Complaints / other details:   -History of Stage I breast cancer diagnosed in August 1999.  Treated with Lumpectomy, chemotherapy, radiation. - No evidence of recurrence.

## 2022-08-14 NOTE — Telephone Encounter (Signed)
RE: referral Received: 5 days ago Dana Spatz, NP  Marylen Ponto, LPN; [facility] Triage Pool She was a no show when it was scheduled 2/19. We will get her in. Triage, can we get patient scheduled for PFT's asap. Thanks       Previous Messages    ----- Message ----- From: Marylen Ponto, LPN Sent: [id number]   3:10 PM EDT To: Dana Spatz, NP Subject: referral                                      Hi Sarah, Dr Kipp Brood has canceled Dana [name]'s appt on Friday 08/09/22 due to in compete testing. She is needing a PFT prior to scheduling with him. Can your office set this test up for the patient and then I will get her rescheduled. Thanks for your help   Front desk please schedule pt for PFTs ASAP also see if pt is will to go to [facility] to be done sooner. Thank you

## 2022-08-14 NOTE — Telephone Encounter (Signed)
Pt did complete PFT on 08/09/22. Nothing further needed.   Routing to [employer] as Juluis Rainier

## 2022-08-14 NOTE — Telephone Encounter (Signed)
Patient advises she has already completed PFT its in her chart under media. She also advises she will not be having surgery done   Dana [name] Stable

## 2022-08-14 NOTE — Telephone Encounter (Signed)
Called pt to inform her of the appointment made and she stated she's already had her PFT done. Cancelled appointment. Please advise.

## 2022-08-15 ENCOUNTER — Ambulatory Visit
Admission: RE | Admit: 2022-08-15 | Discharge: 2022-08-15 | Disposition: A | Discharge status: Home / Self Care | Payer: Medicare Other | Source: Ambulatory Visit | Attending: Radiation Oncology | Admitting: Radiation Oncology

## 2022-08-15 ENCOUNTER — Encounter: Payer: Self-pay | Admitting: Radiation Oncology

## 2022-08-15 VITALS — BP 156/66 | HR 60 | Temp 97.9°F | Resp 20 | Ht 68.0 in | Wt 175.8 lb

## 2022-08-15 DIAGNOSIS — C342 Malignant neoplasm of middle lobe, bronchus or lung: Secondary | ICD-10-CM

## 2022-08-15 DIAGNOSIS — Z853 Personal history of malignant neoplasm of breast: Secondary | ICD-10-CM | POA: Insufficient documentation

## 2022-08-15 DIAGNOSIS — C3491 Malignant neoplasm of unspecified part of right bronchus or lung: Secondary | ICD-10-CM

## 2022-08-15 DIAGNOSIS — Z171 Estrogen receptor negative status [ER-]: Secondary | ICD-10-CM

## 2022-08-15 NOTE — Progress Notes (Signed)
Radiation Oncology         ([phone]) [phone] ________________________________  Name: Dana [name]        MRN: [id number]  Date of Service: 08/15/2022 DOB: 05-13-1961  [id number], [name], [name]  Derwood Kaplan, MD     REFERRING PHYSICIAN: Derwood Kaplan, MD   DIAGNOSIS: The primary encounter diagnosis was Malignant neoplasm of central portion of right breast in female, estrogen receptor negative ([place]). Diagnoses of Bronchogenic lung cancer, right ([place]) and Primary malignant neoplasm of right middle lobe of lung ([place]) were also pertinent to this visit.   HISTORY OF PRESENT ILLNESS: Dana [name] is a 62 y.o. female seen at the request of Dr. Hinton [name] for a newly diagnosed stage I lung cancer. She also has a history of Stage I breast cancer diagnosed in August 1999. This was a 1.1 cm invasive ductal carcinoma, treated with right lumpectomy, CMF chemotherapy, and radiation. She did not tolerate tamoxifen, but has never had evidence of recurrence.   The patient had a lung cancer screening CT scan on 06/03/22 that showed a new subpleural nodule in the RML measuring 1.59 cm. She had a PET scan on 06/12/22 that showed hypermetabolic activity in the nodule without evidence of adenopathy or metastasis. Super D CT on 06/28/22 showed the lesion to be 1.4 cm, and bronchoscopy on 07/02/22 showed a non small cell lung cancer, consistent with squamous cell carcinoma in the FNA and no disease in the sampled station 7 lymph node. While she has PFTs that are acceptable for lobectomy, she is a Sales promotion account executive [employer] and is concerned about blood loss from surgery as she would not accept blood products. Rather that surgery, she is interested in less invasive options and is seen to discuss radiotherapy.    PREVIOUS RADIATION THERAPY: Yes   1999: The right breast was treated with adjuvant radiation after lumpectomy in [place], [place] with Dr. Werner Lean. She believes she received 30 fractions, other details are  unavailable in her accessible medical records.   PAST MEDICAL HISTORY:  Past Medical History:  Diagnosis Date   Asthma    Atypical chest pain 03/25/2016   Breast cancer ([place])    COPD (chronic obstructive pulmonary disease) ([place])    Dizziness 08/18/2017   Hx of adenomatous polyp of colon 08/09/2009   Hyperthyroidism 01/04/2016   Late effects of CVA (cerebrovascular accident) 03/25/2016   Lymphedema of right upper extremity 08/09/2020   Malignant neoplasm of central portion of breast in female, estrogen receptor negative ([place]) 03/25/2016   Obstructive sleep apnea syndrome 03/25/2016   Paroxysmal atrial fibrillation ([place]) 03/25/2016   Precordial chest pain 08/16/2020       PAST SURGICAL HISTORY: Past Surgical History:  Procedure Laterality Date   ABDOMINAL HYSTERECTOMY     APPENDECTOMY     BREAST BIOPSY     BRONCHIAL BIOPSY  07/02/2022   Procedure: BRONCHIAL BIOPSIES;  Surgeon: Garner Nash, DO;  Location: [facility];  Service: Pulmonary;;   BRONCHIAL NEEDLE ASPIRATION BIOPSY  07/02/2022   Procedure: BRONCHIAL NEEDLE ASPIRATION BIOPSIES;  Surgeon: Garner Nash, DO;  Location: [facility];  Service: Pulmonary;;   FINE NEEDLE ASPIRATION  07/02/2022   Procedure: FINE NEEDLE ASPIRATION (FNA) LINEAR;  Surgeon: Garner Nash, DO;  Location: [facility];  Service: Pulmonary;;   lymph node removal     PORTACATH PLACEMENT     portacath removal     TONSILLECTOMY     VIDEO BRONCHOSCOPY WITH ENDOBRONCHIAL ULTRASOUND  07/02/2022   Procedure: VIDEO  BRONCHOSCOPY WITH ENDOBRONCHIAL ULTRASOUND;  Surgeon: Garner Nash, DO;  Location: [facility];  Service: Pulmonary;;     FAMILY HISTORY:  Family History  Problem Relation Age of Onset   Hypertension Maternal Grandmother    Lung cancer Maternal Uncle    Hypertension Mother    Ovarian cancer Paternal Aunt    Ovarian cancer Paternal Grandfather      SOCIAL HISTORY:  reports that she quit smoking about 9 years ago. Her smoking use  included cigarettes. She has never used smokeless tobacco. She reports current alcohol use. She reports that she does not use drugs. The patient is married and lives in [place], [place]. She cares for her elderly mother with Dementia and is concerned about taking any time away from her to recover from her own treatment or procedures.    ALLERGIES: Patient has no known allergies.   MEDICATIONS:  Current Outpatient Medications  Medication Sig Dispense Refill   apixaban (ELIQUIS) 5 MG TABS tablet Take 1 tablet (5 mg total) by mouth 2 (two) times daily. 180 tablet 3   atenolol (TENORMIN) 50 MG tablet Take 50 mg by mouth daily.     atorvastatin (LIPITOR) 80 MG tablet Take 1 tablet (80 mg total) by mouth daily. 90 tablet 3   diltiazem (CARDIZEM) 30 MG tablet Take 1 tablet (30 mg total) by mouth every 6 (six) hours as needed (palpitations). 30 tablet 1   ibuprofen (ADVIL) 200 MG tablet Take 400 mg by mouth every 6 (six) hours as needed for headache.     montelukast (SINGULAIR) 10 MG tablet Take 10 mg by mouth at bedtime.     pantoprazole (PROTONIX) 40 MG tablet Take 40 mg by mouth daily.     sertraline (ZOLOFT) 100 MG tablet Take 150 mg by mouth daily.      torsemide (DEMADEX) 10 MG tablet Take 1 tablet (10 mg total) by mouth daily. 90 tablet 2   traMADol (ULTRAM) 50 MG tablet Take 1 tablet (50 mg total) by mouth every 6 (six) hours as needed for moderate pain. 60 tablet 3   TRELEGY ELLIPTA 100-62.5-25 MCG/ACT AEPB Take 1 puff by mouth daily.     No current facility-administered medications for this encounter.     REVIEW OF SYSTEMS: On review of systems, the patient reports that she is doing fine and denies any chest pain, or shortness of breath at rest. She has had some chest tightness and was seen by cardiology regarding this.  She notes feeling tired and more winded when she's in a cooler temperature. She has an occasional productive cough with clear mucous but no hemoptysis.      PHYSICAL EXAM:   Wt Readings from Last 3 Encounters:  08/15/22 175 lb 12.8 oz (79.7 kg)  08/09/22 175 lb (79.4 kg)  07/29/22 172 lb (78 kg)   Temp Readings from Last 3 Encounters:  08/15/22 97.9 F (36.6 C)  07/02/22 (!) 97.5 F (36.4 C) (Temporal)  06/13/22 98.3 F (36.8 C) (Oral)   BP Readings from Last 3 Encounters:  08/15/22 (!) 156/66  08/09/22 137/70  07/29/22 130/72   Pulse Readings from Last 3 Encounters:  08/15/22 60  08/09/22 60  07/29/22 (!) 50   Pain Assessment Pain Score: 0-No pain/10  In general this is a well appearing caucasian in no acute distress. She's alert and oriented x4 and appropriate throughout the examination. Cardiopulmonary assessment is negative for acute distress and she exhibits normal effort.     ECOG =  1  0 - Asymptomatic (Fully active, able to carry on all predisease activities without restriction)  1 - Symptomatic but completely ambulatory (Restricted in physically strenuous activity but ambulatory and able to carry out work of a light or sedentary nature. For example, light housework, office work)  2 - Symptomatic, <50% in bed during the day (Ambulatory and capable of all self care but unable to carry out any work activities. Up and about more than 50% of waking hours)  3 - Symptomatic, >50% in bed, but not bedbound (Capable of only limited self-care, confined to bed or chair 50% or more of waking hours)  4 - Bedbound (Completely disabled. Cannot carry on any self-care. Totally confined to bed or chair)  5 - Death   Eustace Pen MM, Creech RH, Tormey DC, et al. [phone]). "Toxicity and response criteria of the [facility]". [place] Oncol. 5 (6): 649-55    LABORATORY DATA:  Lab Results  Component Value Date   WBC 7.8 06/14/2022   HGB 11.9 (L) 06/14/2022   HCT 36.9 06/14/2022   MCV 87.0 06/14/2022   PLT 213 06/14/2022   Lab Results  Component Value Date   NA 140 06/14/2022   K 5.1 06/14/2022   CL 105 06/14/2022   CO2 24  06/14/2022   Lab Results  Component Value Date   ALT 15 06/14/2022   AST 23 06/14/2022   ALKPHOS 98 06/14/2022   BILITOT 0.7 06/14/2022      RADIOGRAPHY: No results found.     IMPRESSION/PLAN: 1. Stage IA2, cT1bN0M0, NSCLC, squamous cell carcinoma of the RML. Dr. Lisbeth Renshaw discusses the pathology findings and reviews the nature of early stage lung disease. Dr. Lisbeth Renshaw reviews that the standard of care is for surgical resection. However for patients who are not medical candidates to undergo surgery, or who choose to forgo surgery, stereotactic body radiotherapy (SBRT) is an appropriate alternative. We discussed the risks, benefits, short, and long term effects of radiotherapy, as well as the curative intent. The patient is interested in proceeding after considering the time she feels she would have to be away from caring for her ailing mother if she chose surgery. Dr. Lisbeth Renshaw discusses the delivery and logistics of radiotherapy and anticipates a course of 3-5 fractions of SBRT. Written consent is obtained and placed in the chart, a copy was provided to the patient. The patient will be contacted to coordinate treatment planning by our simulation department.  She will continue with surveillance under the care of Dr. Hinton [name]. 2. Remote history of ER positive Stage I, grade 3 invasive ductal carcinoma of the right breast. She will follow in surveillance as she is followed by Dr. Hinton [name] for #1.    In a visit lasting 60 minutes, greater than 50% of the time was spent face to face discussing the patient's condition, in preparation for the discussion, and coordinating the patient's care.   The above documentation reflects my direct findings during this shared patient visit. Please see the separate note by Dr. Lisbeth Renshaw on this date for the remainder of the patient's plan of care.    Carola Rhine, [facility]   **Disclaimer: This note was dictated with voice recognition software. Similar sounding words can  inadvertently be transcribed and this note may contain transcription errors which may not have been corrected upon publication of note.**

## 2022-08-21 ENCOUNTER — Telehealth: Payer: Self-pay | Admitting: Oncology

## 2022-08-21 ENCOUNTER — Other Ambulatory Visit: Payer: Self-pay | Admitting: Oncology

## 2022-08-21 DIAGNOSIS — C3491 Malignant neoplasm of unspecified part of right bronchus or lung: Secondary | ICD-10-CM

## 2022-08-21 NOTE — Telephone Encounter (Signed)
Patient has been scheduled. Aware of appt date and time   Derwood Kaplan, MD sent to Belva Chimes, LPN; Georgette Shell, RN; Neva Seat; [place], [place] She will having radiation in [place] so let's schedule f/u with me in 1-2 months

## 2022-08-22 ENCOUNTER — Encounter (HOSPITAL_BASED_OUTPATIENT_CLINIC_OR_DEPARTMENT_OTHER): Payer: Medicare Other

## 2022-09-02 ENCOUNTER — Other Ambulatory Visit: Payer: Self-pay

## 2022-09-02 ENCOUNTER — Ambulatory Visit
Admission: RE | Admit: 2022-09-02 | Discharge: 2022-09-02 | Disposition: A | Discharge status: Home / Self Care | Payer: Medicare Other | Source: Ambulatory Visit | Attending: Radiation Oncology | Admitting: Radiation Oncology

## 2022-09-02 DIAGNOSIS — Z510 Encounter for antineoplastic radiation therapy: Secondary | ICD-10-CM | POA: Insufficient documentation

## 2022-09-02 DIAGNOSIS — C342 Malignant neoplasm of middle lobe, bronchus or lung: Secondary | ICD-10-CM | POA: Diagnosis present

## 2022-09-02 DIAGNOSIS — Z87891 Personal history of nicotine dependence: Secondary | ICD-10-CM | POA: Diagnosis not present

## 2022-09-07 DIAGNOSIS — Z510 Encounter for antineoplastic radiation therapy: Secondary | ICD-10-CM | POA: Diagnosis not present

## 2022-09-11 ENCOUNTER — Ambulatory Visit
Admission: RE | Admit: 2022-09-11 | Discharge: 2022-09-11 | Disposition: A | Discharge status: Home / Self Care | Payer: Medicare Other | Source: Ambulatory Visit | Attending: Radiation Oncology | Admitting: Radiation Oncology

## 2022-09-11 ENCOUNTER — Other Ambulatory Visit: Payer: Self-pay

## 2022-09-11 DIAGNOSIS — Z510 Encounter for antineoplastic radiation therapy: Secondary | ICD-10-CM | POA: Diagnosis not present

## 2022-09-11 LAB — RAD ONC ARIA SESSION SUMMARY
Course Elapsed Days: 0
Plan Fractions Treated to Date: 1
Plan Prescribed Dose Per Fraction: 12 Gy
Plan Total Fractions Prescribed: 5
Plan Total Prescribed Dose: 60 Gy
Reference Point Dosage Given to Date: 12 Gy
Reference Point Session Dosage Given: 12 Gy
Session Number: 1

## 2022-09-12 ENCOUNTER — Ambulatory Visit: Payer: Medicare Other | Admitting: Radiation Oncology

## 2022-09-13 ENCOUNTER — Other Ambulatory Visit: Payer: Self-pay

## 2022-09-13 ENCOUNTER — Ambulatory Visit
Admission: RE | Admit: 2022-09-13 | Discharge: 2022-09-13 | Disposition: A | Discharge status: Home / Self Care | Payer: Medicare Other | Source: Ambulatory Visit | Attending: Radiation Oncology | Admitting: Radiation Oncology

## 2022-09-13 DIAGNOSIS — Z510 Encounter for antineoplastic radiation therapy: Secondary | ICD-10-CM | POA: Diagnosis not present

## 2022-09-13 LAB — RAD ONC ARIA SESSION SUMMARY
Course Elapsed Days: 2
Plan Fractions Treated to Date: 2
Plan Prescribed Dose Per Fraction: 12 Gy
Plan Total Fractions Prescribed: 5
Plan Total Prescribed Dose: 60 Gy
Reference Point Dosage Given to Date: 24 Gy
Reference Point Session Dosage Given: 12 Gy
Session Number: 2

## 2022-09-16 ENCOUNTER — Other Ambulatory Visit: Payer: Self-pay

## 2022-09-16 ENCOUNTER — Ambulatory Visit
Admission: RE | Admit: 2022-09-16 | Discharge: 2022-09-16 | Disposition: A | Discharge status: Home / Self Care | Payer: Medicare Other | Source: Ambulatory Visit | Attending: Radiation Oncology | Admitting: Radiation Oncology

## 2022-09-16 DIAGNOSIS — Z510 Encounter for antineoplastic radiation therapy: Secondary | ICD-10-CM | POA: Diagnosis not present

## 2022-09-16 LAB — RAD ONC ARIA SESSION SUMMARY
Course Elapsed Days: 5
Plan Fractions Treated to Date: 3
Plan Prescribed Dose Per Fraction: 12 Gy
Plan Total Fractions Prescribed: 5
Plan Total Prescribed Dose: 60 Gy
Reference Point Dosage Given to Date: 36 Gy
Reference Point Session Dosage Given: 12 Gy
Session Number: 3

## 2022-09-17 ENCOUNTER — Ambulatory Visit: Payer: Medicare Other | Admitting: Radiation Oncology

## 2022-09-18 ENCOUNTER — Other Ambulatory Visit: Payer: Self-pay

## 2022-09-18 ENCOUNTER — Ambulatory Visit
Admission: RE | Admit: 2022-09-18 | Discharge: 2022-09-18 | Disposition: A | Discharge status: Home / Self Care | Payer: Medicare Other | Source: Ambulatory Visit | Attending: Radiation Oncology | Admitting: Radiation Oncology

## 2022-09-18 DIAGNOSIS — Z510 Encounter for antineoplastic radiation therapy: Secondary | ICD-10-CM | POA: Diagnosis not present

## 2022-09-18 LAB — RAD ONC ARIA SESSION SUMMARY
Course Elapsed Days: 7
Plan Fractions Treated to Date: 4
Plan Prescribed Dose Per Fraction: 12 Gy
Plan Total Fractions Prescribed: 5
Plan Total Prescribed Dose: 60 Gy
Reference Point Dosage Given to Date: 48 Gy
Reference Point Session Dosage Given: 12 Gy
Session Number: 4

## 2022-09-20 ENCOUNTER — Ambulatory Visit
Admission: RE | Admit: 2022-09-20 | Discharge: 2022-09-20 | Disposition: A | Discharge status: Home / Self Care | Payer: Medicare Other | Source: Ambulatory Visit | Attending: Radiation Oncology | Admitting: Radiation Oncology

## 2022-09-20 ENCOUNTER — Other Ambulatory Visit: Payer: Self-pay

## 2022-09-20 DIAGNOSIS — Z510 Encounter for antineoplastic radiation therapy: Secondary | ICD-10-CM | POA: Diagnosis not present

## 2022-09-20 LAB — RAD ONC ARIA SESSION SUMMARY
Course Elapsed Days: 9
Plan Fractions Treated to Date: 5
Plan Prescribed Dose Per Fraction: 12 Gy
Plan Total Fractions Prescribed: 5
Plan Total Prescribed Dose: 60 Gy
Reference Point Dosage Given to Date: 60 Gy
Reference Point Session Dosage Given: 12 Gy
Session Number: 5

## 2022-09-23 NOTE — Radiation Completion Notes (Addendum)
  Radiation Oncology         [phone]) [phone] ________________________________  Name: Dana [name] MRN: [id number]  Date of Service: 09/20/2022  DOB: May 25, 1961  End of Treatment Note   Diagnosis:  Stage IA2, cT1bN0M0, NSCLC, squamous cell carcinoma of the RML.    Intent: Curative     ==========DELIVERED PLANS==========  First Treatment Date: 2022-09-11 - Last Treatment Date: 2022-09-20   Plan Name: Lung_R_SBRT Site: Lung, Right Technique: SBRT/SRT-IMRT Mode: Photon Dose Per Fraction: 12 Gy Prescribed Dose (Delivered / Prescribed): 60 Gy / 60 Gy Prescribed Fxs (Delivered / Prescribed): 5 / 5     ==========ON TREATMENT VISIT DATES========== 2022-09-11, 2022-09-13, 2022-09-16, 2022-09-18, 2022-09-20, 2022-09-20     See weekly On Treatment Notes is Epic for details. The patient tolerated radiation. She developed fatigue and anticipated skin changes in the treatment field.   The patient will receive a call in about one month from the radiation oncology department. She will continue follow up with Dr. Gilman Buttner as well for surveillance scans.      Osker Mason, PAC

## 2022-09-24 ENCOUNTER — Inpatient Hospital Stay: Payer: Medicare Other | Admitting: Hematology and Oncology

## 2022-10-17 NOTE — Progress Notes (Signed)
[facility]  [address] [place],  [place]  [address] [phone]  Clinic Day:10/18/22  Referring physician: Gus Height, [place]    CHIEF COMPLAINT:  CC: History of stage I breast cancer, new lung cancer 2024  Current Treatment:  Surveillance  HISTORY OF PRESENT ILLNESS:  Dana [name] is a 62 y.o. female with a history of stage I breast cancer diagnosed in August 1999.  This was a 1.1 cm invasive ductal carcinoma, treated with lumpectomy, CMF chemotherapy, and radiation.  She did not tolerate tamoxifen, but has never had evidence of recurrence.  We began seeing her in August 2004, when she moved to the area.  She is on yearly follow-up, but was last seen in August of 2016.  We did CT scans in April of 2016, and found no evidence of malignancy, but there was a 6 mm nodule in the posterior right upper lobe on the CT chest.  She was seen in July with CT chest to follow up on the lung nodule, and it had resolved.  Due to her personal history of breast cancer diagnosed at age 65, we have recommended genetic testing with the [facility] Hereditary Cancer Panel testing on July 11th, 2016 with [facility] and that result was negative.  She has had a total abdominal hysterectomy for dysfunctional uterine bleeding, but her ovaries are intact.  She states her last colonoscopy was in 2011, and she is due for this again. She had 2 polyps removed at that time, one of which was a tubular adenoma.  She also has atrial fibrillation, history of pneumonia, mild to moderate tricuspid regurgitation, and history of prior stroke in 2014.  I had seen her in October 2017 and we evaluated multiple issues that she was dealing with.  She is off the Prilosec and now on Protonix 40 mg daily and also on vitamin D2 1.25 mg weekly, in addition to her usual medications.  She had a squamous cell carcinoma removed from her right shoulder, and a squamous cell carcinoma in situ  removed from the left anterior chest.  CT imaging from September 2021 revealed stable, benign small pulmonary nodules.  She is on annual low-dose CT lung cancer screening due to his smoking history of 1 pack/day for 35 years.  She did quit 9 years ago.   She does have a history of peptic ulcer disease.  She has had some anxiety and depression.  She had a newly diagnosed lung cancer in January, 2024. The lung cancer screening CT scan from 06/03/22 revealed a new large irregular subpleural solid pulmonary nodule of the right middle lobe measuring 15.9 mm in mean diameter, for a lung RADS 4B, suspicious. She then had a PET scan on 06/12/22 which revealed a hypermetabolic RIGHT upper lobe pulmonary nodule most consistent with bronchogenic carcinoma. There was no evidence of metastatic adenopathy or distant metastatic disease. She has now been seen by a pulmonologist, Dr. Tonia Brooms, and her bronchoscopy revealed a squamous cell carcinoma. MRI of the brain was clear.  Pulmonary function tests revealed a moderate degree of COPD with an FEV1 of 2.09 L, 75% of predicted. We discussed the diagnosis and the fact that this is a stage I. We discussed the options of surgical resection versus stereotactic radiation and she chose the latter. She has been treated by Dr. Mitzi Hansen at [facility] and did well. She was given 5 treatments and finished that on 09/20/22 without complication.    INTERVAL HISTORY:  Dana [name] was here for routine follow up for her history of stage I breast cancer and had a newly diagnosed lung cancer in January, 2024. The lung cancer screening CT scan from 06/03/22 revealed a new large irregular subpleural solid pulmonary nodule of the right middle lobe measuring 15.9 mm in mean diameter, for a lung RADS 4B, suspicious. She then had a PET scan on 06/12/22 which revealed a hypermetabolic RIGHT upper lobe pulmonary nodule most consistent with bronchogenic carcinoma. There was no evidence of metastatic adenopathy or  distant metastatic disease. She has now been seen by a pulmonologist, Dr. Tonia Brooms, and her bronchoscopy revealed a squamous cell carcinoma. MRI of the brain was clear.  Pulmonary function tests revealed a moderate degree of COPD with an FEV1 of 2.09 L, 75% of predicted. We discussed the diagnoses and the fact that this is a stage I. We discussed options of surgical resection versus stereotactic radiation and she chose the latter. She has been treated by Dr. Mitzi Hansen at [facility] and did well. She was given 5 treatments and finished that 4 weeks ago. Patient states that she feels ok and complains of fatigue, intermittent severe headaches, cough with clear/yelowish phlegm. She has now completed radiation on 09/20/2022, 5 treatments without complication. She will receive a follow-up telephone call later in the month. I recommended CT scans every 3 months for the first year and then every 6 months after the first. I will see her back in 2 months with CBC, CMP, CEA, and CT chest 2 days before her appointment with me. She denies signs of infection such as sore throat, sinus drainage, cough, or urinary symptoms.  She denies fevers or recurrent chills. She denies pain. She denies nausea, vomiting, chest pain, dyspnea or cough. Her appetite is ok but not completely back to normal and her weight has decreased 4 pounds over last 3 months . This patient is accompanied in the office by her  husband .   REVIEW OF SYSTEMS:  Review of Systems  Constitutional:  Positive for fatigue. Negative for appetite change, chills, diaphoresis, fever and unexpected weight change.  HENT:  Negative.  Negative for hearing loss, lump/mass, mouth sores, nosebleeds, sore throat, tinnitus, trouble swallowing and voice change.   Eyes: Negative.  Negative for eye problems and icterus.  Respiratory:  Positive for cough (clear/yelowish phlegm). Negative for chest tightness, hemoptysis, shortness of breath and wheezing.   Cardiovascular: Negative.   Negative for chest pain, leg swelling and palpitations.  Gastrointestinal: Negative.  Negative for abdominal distention, abdominal pain, blood in stool, constipation, diarrhea, nausea, rectal pain and vomiting.  Endocrine: Negative.   Genitourinary: Negative.  Negative for bladder incontinence, difficulty urinating, dyspareunia, dysuria, frequency, hematuria, menstrual problem, nocturia, pelvic pain, vaginal bleeding and vaginal discharge.   Musculoskeletal:  Positive for arthralgias (generalized) and myalgias (generalized). Negative for back pain, flank pain, gait problem, neck pain and neck stiffness.  Skin: Negative.  Negative for itching, rash and wound.  Neurological:  Positive for headaches (severe). Negative for dizziness, extremity weakness, gait problem, light-headedness, numbness, seizures and speech difficulty.  Hematological: Negative.  Negative for adenopathy. Does not bruise/bleed easily.  Psychiatric/Behavioral:  Positive for sleep disturbance (due to generalized pain). Negative for confusion, decreased concentration, depression and suicidal ideas. The patient is not nervous/anxious.      VITALS:  Blood pressure (!) 156/71, pulse 62, temperature 98.6 F (37 C), temperature source Oral, resp. rate 18, height 5\' 8"  (1.727 m), weight 171 lb 1.6 oz (77.6 kg), SpO2  94 %.  Wt Readings from Last 3 Encounters:  10/18/22 171 lb 1.6 oz (77.6 kg)  08/15/22 175 lb 12.8 oz (79.7 kg)  08/09/22 175 lb (79.4 kg)    Body mass index is 26.02 kg/m.  Performance status (ECOG): 1 - Symptomatic but completely ambulatory  PHYSICAL EXAM:  Physical Exam Vitals and nursing note reviewed. Exam conducted with a chaperone present.  Constitutional:      General: She is not in acute distress.    Appearance: Normal appearance. She is normal weight. She is not ill-appearing, toxic-appearing or diaphoretic.  HENT:     Head: Normocephalic and atraumatic.     Right Ear: Tympanic membrane, ear canal and  external ear normal. There is no impacted cerumen.     Left Ear: Tympanic membrane, ear canal and external ear normal. There is no impacted cerumen.     Nose: Nose normal. No congestion or rhinorrhea.     Mouth/Throat:     Mouth: Mucous membranes are moist.     Pharynx: Oropharynx is clear. No oropharyngeal exudate or posterior oropharyngeal erythema.  Eyes:     General: No scleral icterus.       Right eye: No discharge.        Left eye: No discharge.     Extraocular Movements: Extraocular movements intact.     Conjunctiva/sclera: Conjunctivae normal.     Pupils: Pupils are equal, round, and reactive to light.  Neck:     Vascular: No carotid bruit.  Cardiovascular:     Rate and Rhythm: Normal rate and regular rhythm.     Pulses: Normal pulses.     Heart sounds: Normal heart sounds. No murmur heard.    No friction rub. No gallop.  Pulmonary:     Effort: Pulmonary effort is normal. No respiratory distress.     Breath sounds: Normal breath sounds. No stridor. No wheezing, rhonchi or rales.  Chest:     Chest wall: No tenderness.  Breasts:    Right: Normal.     Left: Normal.     Comments: Scar in the right breast at 3 o'clock which is well healed and barely visible.  Scar in the right axilla which is healed.  No masses in either breast.  Abdominal:     General: Bowel sounds are normal. There is no distension.     Palpations: Abdomen is soft. There is no hepatomegaly, splenomegaly or mass.     Tenderness: There is no abdominal tenderness. There is no right CVA tenderness, left CVA tenderness, guarding or rebound.     Hernia: No hernia is present.  Musculoskeletal:        General: No swelling, tenderness or deformity. Normal range of motion.     Cervical back: Normal range of motion and neck supple. No rigidity or tenderness.     Right lower leg: No edema.     Left lower leg: No edema.  Lymphadenopathy:     Cervical: No cervical adenopathy.  Skin:    General: Skin is warm and  dry.     Coloration: Skin is not jaundiced or pale.     Findings: No bruising, erythema, lesion or rash.  Neurological:     General: No focal deficit present.     Mental Status: She is alert and oriented to person, place, and time. Mental status is at baseline.     Cranial Nerves: No cranial nerve deficit.     Sensory: No sensory deficit.     Motor:  No weakness.     Coordination: Coordination normal.     Gait: Gait normal.     Deep Tendon Reflexes: Reflexes normal.  Psychiatric:        Mood and Affect: Mood normal.        Behavior: Behavior normal.        Thought Content: Thought content normal.        Judgment: Judgment normal.     LABS:      Latest Ref Rng & Units 06/14/2022    8:36 AM 04/06/2021   12:00 AM 04/06/2020    3:00 PM  CBC  WBC 4.0 - 10.5 K/uL 7.8  6.8  8.1   Hemoglobin 12.0 - 15.0 g/dL 16.1  09.6  04.5   Hematocrit 36.0 - 46.0 % 36.9  38  37.2   Platelets 150 - 400 K/uL 213  188  237       Latest Ref Rng & Units 06/14/2022    8:36 AM 04/06/2021   12:00 AM 12/01/2020    2:12 PM  CMP  Glucose 70 - 99 mg/dL 409   811   BUN 8 - 23 mg/dL 16  12  14    Creatinine 0.44 - 1.00 mg/dL 9.14  0.7  7.82   Sodium 135 - 145 mmol/L 140  140  142   Potassium 3.5 - 5.1 mmol/L 5.1  3.5  3.9   Chloride 98 - 111 mmol/L 105  102  104   CO2 22 - 32 mmol/L 24  24  22    Calcium 8.9 - 10.3 mg/dL 8.9  9.3  9.4   Total Protein 6.5 - 8.1 g/dL 7.3     Total Bilirubin 0.3 - 1.2 mg/dL 0.7     Alkaline Phos 38 - 126 U/L 98  115    AST 15 - 41 U/L 23  20    ALT 0 - 44 U/L 15  16     Component Ref Range & Units 06/14/2022  CEA 0.0 - 4.7 ng/mL 2.8   Lab Results  Component Value Date   TIBC 467 (H) 04/06/2020   FERRITIN 12 04/06/2020   IRONPCTSAT 10 (L) 04/06/2020   Lab Results  Component Value Date   LDH 163 04/06/2020   STUDIES:  EXAM:07/25/22 DIGITAL SCREENING BILATERAL MAMMO WITH TOMO AND CAD IMPRESSION: No mammographic evidence of malignancy  EXAM: 07/22/22 MRI HEAD  WITHOUT AND WITH CONTRAST IMPRESSION: No evidence of intracranial metastases Mild chronic small vessel ischemic disease   EXAM: 07/02/2022 PORTABLE CHEST 1 VIEW Impression:  No pneumothorax following bronchoscopy.       EXAM: 06/28/2022 CT CHEST WITHOUT CONTRAST Impression:  1. 14 x 13 mm peripheral right middle lobe pulmonary nodule is similar to minimally increased compared to recent lung cancer screening CT of 06/03/2022. Imaging features remain concerning for primary bronchogenic neoplasm. 2. No evidence for metastatic disease in the chest. 3. Small hiatal hernia. 4.  Aortic Atherosclerosis (ICD10-I70.0).  EXAM:06/12/22 PET SCAN IMPRESSION: Hypermetabolic RIGHT upper lobe pulmonary nodule most consistent with bronchogenic carcinoma. No evidence of metastatic adenopathy or distant metastatic disease.   EXAM:06/03/22 CT CHEST WITHOUT CONTRAST LOW DOSE FOR LUNG CANCER SCREENING IMPRESSION: New large irregular subpleural solid pulmonary nodule of the right middle lobe measuring 15.9 mm in mean diameter. Lung RADS 4B. Suspicious. Additional imaging evaluation or consultation with Pulmonology or Thoracic Surgery recommended.\\ Aortic Atherosclerosis and Emphysema.    Allergies: No Known Allergies  Current Medications: Current Outpatient Medications  Medication Sig Dispense Refill  traZODone (DESYREL) 50 MG tablet Take 100 mg by mouth at bedtime as needed.     apixaban (ELIQUIS) 5 MG TABS tablet Take 1 tablet (5 mg total) by mouth 2 (two) times daily. 180 tablet 3   atenolol (TENORMIN) 50 MG tablet Take 50 mg by mouth daily.     atorvastatin (LIPITOR) 80 MG tablet Take 1 tablet (80 mg total) by mouth daily. 90 tablet 3   diltiazem (CARDIZEM) 30 MG tablet Take 1 tablet (30 mg total) by mouth every 6 (six) hours as needed (palpitations). 30 tablet 1   ibuprofen (ADVIL) 200 MG tablet Take 400 mg by mouth every 6 (six) hours as needed for headache.     montelukast  (SINGULAIR) 10 MG tablet Take 10 mg by mouth at bedtime.     pantoprazole (PROTONIX) 40 MG tablet Take 40 mg by mouth daily.     sertraline (ZOLOFT) 100 MG tablet Take 150 mg by mouth daily.      torsemide (DEMADEX) 10 MG tablet Take 1 tablet (10 mg total) by mouth daily. 90 tablet 2   traMADol (ULTRAM) 50 MG tablet Take 1 tablet (50 mg total) by mouth every 6 (six) hours as needed for moderate pain. 60 tablet 3   TRELEGY ELLIPTA 100-62.5-25 MCG/ACT AEPB Take 1 puff by mouth daily.     No current facility-administered medications for this visit.     ASSESSMENT & PLAN:  Assessment:   1.  Stage I breast cancer diagnosed in August 1999.  She remains without evidence of recurrence.  2.  Former smoker with COPD. It has been 9 years since she quit smoking, after 35 pack years.  The annual low dose lung cancer screening CT scan has now found a lung cancer and this appears to be a stage IA2 (T1b N0 M0) with no adenopathy and pathology reveals a squamous cell carcinoma.  Pulmonary function test revealed moderate obstruction with an FEV1 of 2.09 L, 75% of predicted.  3.  Non-small cell lung cancer measuring 15.9 mm and positive on PET but this appears to be a stage I lesion.  Pathology reveals a squamous cell carcinoma  We have discussed the risks and benefits of surgery versus stereotactic radiation. She does have some degree of COPD, felt to be mild to moderate based on recent pulmonary function tests. She was given 5 treatments and finished that 4 weeks ago with Dr. Mitzi Hansen, he is agreeable to her continuing her follow-up here.    Plan: The lung cancer screening CT scan from 06/03/22 revealed a new large irregular subpleural solid pulmonary nodule of the right middle lobe measuring 15.9 mm in mean diameter, for a lung RADS 4B, suspicious. She then had a PET scan on 06/12/22 which revealed a hypermetabolic RIGHT upper lobe pulmonary nodule most consistent with bronchogenic carcinoma. There was no evidence of  metastatic adenopathy or distant metastatic disease. She has now been seen by a pulmonologist, Dr. Tonia Brooms, and her bronchoscopy revealed a squamous cell carcinoma. MRI of the brain was clear.  Pulmonary function tests reveals a moderate degree of COPD with an FEV1 of 2.09 L, 75% of predicted. We discussed the diagnosis and likelihood that this is a stage IA2 (T1b N0 M0). We discussed options of surgical resection versus stereotactic radiation and she chose the latter. She has been treated by Dr. Mitzi [facility] at [facility] and did well. She was given 5 treatments and finished that 4 weeks ago, on 09/20/2022, without complication. She will receive a  follow-up telephone call later in the month. I recommended CT scans every 3 months for the first year and then every 6 months after the first year. I will see her back in 2 months with CBC, CMP, CEA, and CT chest 2 days before her appointment with me. I discussed the risks and benefits of all approaches and answered their questions.  I have discussed this at length with her and her husband, and they understand and agree with this plan of care.   I provided 20 minutes of time during this this encounter., with over 50% spent in face to face discussion and counseling.  Dellia Beckwith, MD [facility] [address] [place] [address] Dept: [phone] Dept Fax: [phone]    Rulon Sera Lassiter,acting as a scribe for Dellia Beckwith, MD.,have documented all relevant documentation on the behalf of Dellia Beckwith, MD,as directed by  Dellia Beckwith, MD while in the presence of Dellia Beckwith, MD.

## 2022-10-18 ENCOUNTER — Other Ambulatory Visit: Payer: Self-pay | Admitting: Oncology

## 2022-10-18 ENCOUNTER — Inpatient Hospital Stay: Payer: Medicare Other | Attending: Oncology | Admitting: Oncology

## 2022-10-18 ENCOUNTER — Encounter: Payer: Self-pay | Admitting: Oncology

## 2022-10-18 VITALS — BP 156/71 | HR 62 | Temp 98.6°F | Resp 18 | Ht 68.0 in | Wt 171.1 lb

## 2022-10-18 DIAGNOSIS — Z171 Estrogen receptor negative status [ER-]: Secondary | ICD-10-CM

## 2022-10-18 DIAGNOSIS — C3491 Malignant neoplasm of unspecified part of right bronchus or lung: Secondary | ICD-10-CM | POA: Diagnosis not present

## 2022-10-18 DIAGNOSIS — C50111 Malignant neoplasm of central portion of right female breast: Secondary | ICD-10-CM

## 2022-11-05 ENCOUNTER — Other Ambulatory Visit: Payer: Self-pay | Admitting: Cardiology

## 2022-11-05 NOTE — Telephone Encounter (Signed)
Rx sent to pharmacy   

## 2022-11-11 ENCOUNTER — Ambulatory Visit
Admission: RE | Admit: 2022-11-11 | Discharge: 2022-11-11 | Disposition: A | Discharge status: Home / Self Care | Payer: Medicare Other | Source: Ambulatory Visit | Attending: Radiation Oncology | Admitting: Radiation Oncology

## 2022-11-11 NOTE — Progress Notes (Addendum)
  Radiation Oncology         [phone]) [phone] ________________________________  Name: Dana [name] MRN: [id number]  Date of Service: 11/11/2022  DOB: 12-04-1960  Post Treatment Telephone Note  Diagnosis:  Stage IA2, cT1bN0M0, NSCLC, squamous cell carcinoma of the RML. (as documented in provider EOT note)   The patient was not available for call today. Voicemail left.  The patient has scheduled follow up with her medical oncologist Dr. Gilman Buttner for ongoing care, and was encouraged to call if she develops concerns or questions regarding radiation.    Ruel Favors, LPN

## 2022-12-16 LAB — COMPREHENSIVE METABOLIC PANEL WITH GFR
Albumin: 3.9 (ref 3.5–5.0)
Calcium: 9 (ref 8.7–10.7)

## 2022-12-16 LAB — HEPATIC FUNCTION PANEL
ALT: 20 U/L (ref 7–35)
AST: 34 (ref 13–35)
Alkaline Phosphatase: 111 (ref 25–125)
Bilirubin, Total: 0.7

## 2022-12-16 LAB — BASIC METABOLIC PANEL WITH GFR
BUN: 13 (ref 4–21)
CO2: 28 — AB (ref 13–22)
Chloride: 105 (ref 99–108)
Creatinine: 0.6 (ref 0.5–1.1)
Glucose: 105
Potassium: 3.7 meq/L (ref 3.5–5.1)
Sodium: 138 (ref 137–147)

## 2022-12-16 LAB — CBC AND DIFFERENTIAL
HCT: 37 (ref 36–46)
Hemoglobin: 12.4 (ref 12.0–16.0)
Neutrophils Absolute: 4.9
Platelets: 204 K/uL (ref 150–400)
WBC: 7.1

## 2022-12-16 LAB — CBC: RBC: 4.49 (ref 3.87–5.11)

## 2022-12-17 ENCOUNTER — Other Ambulatory Visit: Payer: Self-pay | Admitting: Oncology

## 2022-12-17 DIAGNOSIS — R0789 Other chest pain: Secondary | ICD-10-CM

## 2022-12-18 ENCOUNTER — Encounter: Payer: Self-pay | Admitting: Oncology

## 2022-12-18 ENCOUNTER — Inpatient Hospital Stay: Payer: Medicare Other | Attending: Oncology | Admitting: Oncology

## 2022-12-18 ENCOUNTER — Other Ambulatory Visit: Payer: Self-pay | Admitting: Oncology

## 2022-12-18 VITALS — BP 188/100 | HR 52 | Temp 98.1°F | Resp 20 | Ht 68.0 in | Wt 170.2 lb

## 2022-12-18 DIAGNOSIS — C3491 Malignant neoplasm of unspecified part of right bronchus or lung: Secondary | ICD-10-CM

## 2022-12-18 DIAGNOSIS — Z171 Estrogen receptor negative status [ER-]: Secondary | ICD-10-CM

## 2022-12-18 DIAGNOSIS — C50111 Malignant neoplasm of central portion of right female breast: Secondary | ICD-10-CM

## 2022-12-18 NOTE — Progress Notes (Signed)
[facility]  [address] [place],  [place]  [address] [phone]  Clinic Day: 12/18/2022  Referring physician: Gus Height, PA    CHIEF COMPLAINT:  CC: History of stage I breast cancer, new lung cancer 2024  Current Treatment: Stereotactic radiation to the lung  HISTORY OF PRESENT ILLNESS:  Dana [name] is a 62 y.o. female with a history of stage I breast cancer diagnosed in August 1999.  This was a 1.1 cm invasive ductal carcinoma, treated with lumpectomy, CMF chemotherapy, and radiation.  She did not tolerate tamoxifen, but has never had evidence of recurrence.  We began seeing her in August 2004, when she moved to the area.  She is on yearly follow-up, but was last seen in August of 2016.  We did CT scans in April of 2016, and found no evidence of malignancy, but there was a 6 mm nodule in the posterior right upper lobe on the CT chest.  She was seen in July with CT chest to follow up on the lung nodule, and it had resolved.  Due to her personal history of breast cancer diagnosed at age 43, we have recommended genetic testing with the [facility] Hereditary Cancer Panel testing on July 11th, 2016 with [facility] and that result was negative.  She has had a total abdominal hysterectomy for dysfunctional uterine bleeding, but her ovaries are intact.  She states her last colonoscopy was in 2011, and she is due for this again. She had 2 polyps removed at that time, one of which was a tubular adenoma.  She also has atrial fibrillation, history of pneumonia, mild to moderate tricuspid regurgitation, and history of prior stroke in 2014.  I had seen her in October 2017 and we evaluated multiple issues that she was dealing with.  She is off the Prilosec and now on Protonix 40 mg daily and also on vitamin D2 1.25 mg weekly, in addition to her usual medications.  She had a squamous cell carcinoma removed from her right shoulder, and a squamous cell  carcinoma in situ removed from the left anterior chest.  CT imaging from September 2021 revealed stable, benign small pulmonary nodules.  She is on annual low-dose CT lung cancer screening due to his smoking history of 1 pack/day for 35 years.  She did quit 9 years ago.   She does have a history of peptic ulcer disease.  She has had some anxiety and depression.  She had a newly diagnosed lung cancer in January, 2024. The lung cancer screening CT scan from 06/03/22 revealed a new large irregular subpleural solid pulmonary nodule of the right middle lobe measuring 15.9 mm in mean diameter, for a lung RADS 4B, suspicious. She then had a PET scan on 06/12/22 which revealed a hypermetabolic RIGHT upper lobe pulmonary nodule most consistent with bronchogenic carcinoma. There was no evidence of metastatic adenopathy or distant metastatic disease. She has now been seen by a pulmonologist, Dr. Tonia Brooms, and her bronchoscopy revealed a squamous cell carcinoma. MRI of the brain was clear.  Pulmonary function tests revealed a moderate degree of COPD with an FEV1 of 2.09 L, 75% of predicted. We discussed the diagnosis and the fact that this is a stage I. We discussed the options of surgical resection versus stereotactic radiation and she chose the latter. She has been treated by Dr. Mitzi Hansen at [facility] and did well. She was given 5 treatments and finished that on 09/20/22 without complication.  INTERVAL HISTORY:  Dana [name] was here for routine follow up for her history of stage I breast cancer and had a newly diagnosed lung cancer in January, 2024, discovered by the lung cancer screening CT scan from 06/03/22.  This revealed a new large irregular subpleural solid pulmonary nodule of the right middle lobe measuring 15.9 mm in mean diameter, for a lung RADS 4B, suspicious. She then had a PET scan on 06/12/22 which revealed a hypermetabolic RIGHT upper lobe pulmonary nodule most consistent with bronchogenic carcinoma. There was no  evidence of metastatic adenopathy or distant metastatic disease. She has now been seen by a pulmonologist, Dr. Tonia Brooms, and her bronchoscopy revealed a squamous cell carcinoma. MRI of the brain was clear.  Pulmonary function tests revealed a moderate degree of COPD with an FEV1 of 2.09 L, 75% of predicted. This is a stage I, and we discussed options of surgical resection versus stereotactic radiation and she chose the latter. She has been treated by Dr. Mitzi Hansen at [facility] and did well. She was given 5 treatments and finished that 4 weeks ago. She completed radiation on 09/20/2022, 5 treatments without complication. Patient states that She feels well but she has chronic back pain rated 10/10 which is worse in the mornings and chronic cough. She takes Xyzal once a day which helps with the coughing.  She has a CT scan of the chest on 12/16/22 which revealed  peripheral ground-glass and airspace consolidation within the anterior basal right upper lobe and right middle lobe, rounded mass like area of architectural distortion is also new, extending along the major fissure measuring 4.4 x 2.9 cm and this shows post radiation changes and the underlying subpleural nodule within the anterior superior right middle lobe is partially obscured by these changes. This measures 1.1 x 0.9 cm, compared with 1.4 x 1.3 cm previously.     I told her that the scans were as expected and there would not be any need for  a PET scan now. Her husband asks who she should see for her generalized body aches, sleep tremors, back aches which could be due to fibromyalgia.  She brought in a list of symptoms of Parkinson's disease and feels that she might have this.  I advised her to see a rheumatologist and she said she will think about it.  She is also caring for her elderly mother.  She says she has tremors, slow shuffling gait but I did not observe symptoms of Parkinsons.  Her hemoglobin is 12.4, WBC is 7.1, and platelet count is 204,000 on  12/16/2022. Her CMP is normal with a blood sugar of 105. Her CEA is 3.7 which is normal. I will see her  back in 3 months with CBC, CMP, CEA, and CT scan of the chest. She  denies signs of infections such as sore throat, sinus drainage, cough or urinary symptoms. She  denies fever or recurrent chills. She  also deny nausea, vomiting, chest pain, or dyspnea. Her  appetite is not the best and Her  weight has decreased 1 pounds over last 2 months  This patient is accompanied in the office by her  husband .    REVIEW OF SYSTEMS:  Review of Systems  Constitutional:  Positive for fatigue. Negative for appetite change, chills, diaphoresis, fever and unexpected weight change.  HENT:  Negative.  Negative for hearing loss, lump/mass, mouth sores, nosebleeds, sore throat, tinnitus, trouble swallowing and voice change.   Eyes: Negative.  Negative for eye problems and  icterus.  Respiratory:  Positive for cough (clear/yelowish phlegm). Negative for chest tightness, hemoptysis, shortness of breath and wheezing.   Cardiovascular: Negative.  Negative for chest pain, leg swelling and palpitations.  Gastrointestinal: Negative.  Negative for abdominal distention, abdominal pain, blood in stool, constipation, diarrhea, nausea, rectal pain and vomiting.  Endocrine: Negative.   Genitourinary: Negative.  Negative for bladder incontinence, difficulty urinating, dyspareunia, dysuria, frequency, hematuria, menstrual problem, nocturia, pelvic pain, vaginal bleeding and vaginal discharge.   Musculoskeletal:  Positive for arthralgias (generalized with back pain 10/10) and myalgias (generalized). Negative for back pain, flank pain, gait problem, neck pain and neck stiffness.  Skin: Negative.  Negative for itching, rash and wound.  Neurological:  Positive for headaches (severe). Negative for dizziness, extremity weakness, gait problem, light-headedness, numbness, seizures and speech difficulty.       She has a mild resting tremor  which appears benign. She is concerned about he possibility of parkinson's, I reviewed the sign sand symptom of that  Hematological: Negative.  Negative for adenopathy. Does not bruise/bleed easily.  Psychiatric/Behavioral:  Positive for sleep disturbance (due to generalized pain). Negative for confusion, decreased concentration, depression and suicidal ideas. The patient is not nervous/anxious.      VITALS:  Blood pressure (!) 188/100, pulse (!) 52, temperature 98.1 F (36.7 C), temperature source Oral, resp. rate 20, height 5\' 8"  (1.727 m), weight 170 lb 3.2 oz (77.2 kg), SpO2 97%.  Wt Readings from Last 3 Encounters:  12/18/22 170 lb 3.2 oz (77.2 kg)  10/18/22 171 lb 1.6 oz (77.6 kg)  08/15/22 175 lb 12.8 oz (79.7 kg)    Body mass index is 25.88 kg/m.  Performance status (ECOG): 1 - Symptomatic but completely ambulatory  PHYSICAL EXAM:  Physical Exam Vitals and nursing note reviewed. Exam conducted with a chaperone present.  Constitutional:      General: She is not in acute distress.    Appearance: Normal appearance. She is normal weight. She is not ill-appearing, toxic-appearing or diaphoretic.  HENT:     Head: Normocephalic and atraumatic.     Right Ear: Tympanic membrane, ear canal and external ear normal. There is no impacted cerumen.     Left Ear: Tympanic membrane, ear canal and external ear normal. There is no impacted cerumen.     Nose: Nose normal. No congestion or rhinorrhea.     Mouth/Throat:     Mouth: Mucous membranes are moist.     Pharynx: Oropharynx is clear. No oropharyngeal exudate or posterior oropharyngeal erythema.  Eyes:     General: No scleral icterus.       Right eye: No discharge.        Left eye: No discharge.     Extraocular Movements: Extraocular movements intact.     Conjunctiva/sclera: Conjunctivae normal.     Pupils: Pupils are equal, round, and reactive to light.  Neck:     Vascular: No carotid bruit.  Cardiovascular:     Rate and Rhythm:  Normal rate and regular rhythm.     Pulses: Normal pulses.     Heart sounds: Normal heart sounds. No murmur heard.    No friction rub. No gallop.  Pulmonary:     Effort: Pulmonary effort is normal. No respiratory distress.     Breath sounds: Normal breath sounds. No stridor. No wheezing, rhonchi or rales.     Comments: Mild decreased breath sounds in the left upper lobe Chest:     Chest wall: No tenderness.  Breasts:  Right: Normal.     Left: Normal.  Abdominal:     General: Bowel sounds are normal. There is no distension.     Palpations: Abdomen is soft. There is no hepatomegaly, splenomegaly or mass.     Tenderness: There is no abdominal tenderness. There is no right CVA tenderness, left CVA tenderness, guarding or rebound.     Hernia: No hernia is present.  Musculoskeletal:        General: No swelling, tenderness or deformity. Normal range of motion.     Cervical back: Normal range of motion and neck supple. No rigidity or tenderness.     Right lower leg: No edema.     Left lower leg: No edema.  Lymphadenopathy:     Cervical: No cervical adenopathy.  Skin:    General: Skin is warm and dry.     Coloration: Skin is not jaundiced or pale.     Findings: No bruising, erythema, lesion or rash.  Neurological:     General: No focal deficit present.     Mental Status: She is alert and oriented to person, place, and time. Mental status is at baseline.     Cranial Nerves: No cranial nerve deficit.     Sensory: No sensory deficit.     Motor: No weakness.     Coordination: Coordination normal.     Gait: Gait normal.     Deep Tendon Reflexes: Reflexes normal.  Psychiatric:        Mood and Affect: Mood normal.        Behavior: Behavior normal.        Thought Content: Thought content normal.        Judgment: Judgment normal.     LABS:      Latest Ref Rng & Units 12/16/2022   12:00 AM 06/14/2022    8:36 AM 04/06/2021   12:00 AM  CBC  WBC  7.1     7.8  6.8   Hemoglobin 12.0 -  16.0 12.4     11.9  12.7   Hematocrit 36 - 46 37     36.9  38   Platelets 150 - 400 K/uL 204     213  188      This result is from an external source.      Latest Ref Rng & Units 12/16/2022   12:00 AM 06/14/2022    8:36 AM 04/06/2021   12:00 AM  CMP  Glucose 70 - 99 mg/dL  875    BUN 4 - 21 13     16  12    Creatinine 0.5 - 1.1 0.6     0.92  0.7   Sodium 137 - 147 138     140  140   Potassium 3.5 - 5.1 mEq/L 3.7     5.1  3.5   Chloride 99 - 108 105     105  102   CO2 13 - 22 28     24  24    Calcium 8.7 - 10.7 9.0     8.9  9.3   Total Protein 6.5 - 8.1 g/dL  7.3    Total Bilirubin 0.3 - 1.2 mg/dL  0.7    Alkaline Phos 25 - 125 111     98  115   AST 13 - 35 34     23  20   ALT 7 - 35 U/L 20     15  16       This  result is from an external source.   Component Ref Range & Units 06/14/2022  CEA 0.0 - 4.7 ng/mL 2.8   Lab Results  Component Value Date   TIBC 467 (H) 04/06/2020   FERRITIN 12 04/06/2020   IRONPCTSAT 10 (L) 04/06/2020   Lab Results  Component Value Date   LDH 163 04/06/2020   STUDIES:  EXAM: 12/16/2022 CT CHEST WITH CONTRAST IMPRESSION Interval development of peripheral ground-glass and airspace consolidation within the anterior basal right upper lobe and right middle lobe. Rounded mass like area of architectural distortion is also new extending along the major fissure measuring 4.4 x 2.9 cm. Findings are favored to represent post radiation change. The underlying subpleural nodule within the anterior superior right middle lobe is partially obscured by these changes. Best estimate this measures 1.1 x 0.9 cm, compared with 1.4 x 1.3 cm previously.  Borderline enlarged right hilar lymph node and interval increase in size of right paratracheal and subcarinal lymph nodes. These findings may reflect reactive adenopathy given the inflammatory changes surrounding the right middle lobe lung nodule.  Moderate size hiatal hernia. Aortic Atherosclerosis.     EXAM:07/25/22 DIGITAL SCREENING BILATERAL MAMMO WITH TOMO AND CAD IMPRESSION: No mammographic evidence of malignancy  EXAM: 07/22/22 MRI HEAD WITHOUT AND WITH CONTRAST IMPRESSION: No evidence of intracranial metastases Mild chronic small vessel ischemic disease   EXAM: 07/02/2022 PORTABLE CHEST 1 VIEW Impression:  No pneumothorax following bronchoscopy.       EXAM: 06/28/2022 CT CHEST WITHOUT CONTRAST Impression:  1. 14 x 13 mm peripheral right middle lobe pulmonary nodule is similar to minimally increased compared to recent lung cancer screening CT of 06/03/2022. Imaging features remain concerning for primary bronchogenic neoplasm. 2. No evidence for metastatic disease in the chest. 3. Small hiatal hernia. 4.  Aortic Atherosclerosis (ICD10-I70.0).  EXAM:06/12/22 PET SCAN IMPRESSION: Hypermetabolic RIGHT upper lobe pulmonary nodule most consistent with bronchogenic carcinoma. No evidence of metastatic adenopathy or distant metastatic disease.   EXAM:06/03/22 CT CHEST WITHOUT CONTRAST LOW DOSE FOR LUNG CANCER SCREENING IMPRESSION: New large irregular subpleural solid pulmonary nodule of the right middle lobe measuring 15.9 mm in mean diameter. Lung RADS 4B. Suspicious. Additional imaging evaluation or consultation with Pulmonology or Thoracic Surgery recommended.\\ Aortic Atherosclerosis and Emphysema.    Allergies: No Known Allergies  Current Medications: Current Outpatient Medications  Medication Sig Dispense Refill   apixaban (ELIQUIS) 5 MG TABS tablet Take 1 tablet (5 mg total) by mouth 2 (two) times daily. 180 tablet 3   atenolol (TENORMIN) 50 MG tablet Take 50 mg by mouth daily.     atorvastatin (LIPITOR) 80 MG tablet TAKE ONE TABLET BY MOUTH DAILY 90 tablet 3   diltiazem (CARDIZEM) 30 MG tablet Take 1 tablet (30 mg total) by mouth every 6 (six) hours as needed (palpitations). 30 tablet 1   ibuprofen (ADVIL) 200 MG tablet Take 400 mg by mouth every 6 (six)  hours as needed for headache.     montelukast (SINGULAIR) 10 MG tablet Take 10 mg by mouth at bedtime.     pantoprazole (PROTONIX) 40 MG tablet Take 40 mg by mouth daily.     sertraline (ZOLOFT) 100 MG tablet Take 150 mg by mouth daily.      torsemide (DEMADEX) 10 MG tablet Take 1 tablet (10 mg total) by mouth daily. 90 tablet 2   traMADol (ULTRAM) 50 MG tablet Take 1 tablet (50 mg total) by mouth every 6 (six) hours as needed for moderate pain. 60 tablet 3  traZODone (DESYREL) 50 MG tablet Take 100 mg by mouth at bedtime as needed.     TRELEGY ELLIPTA 100-62.5-25 MCG/ACT AEPB Take 1 puff by mouth daily.     No current facility-administered medications for this visit.     ASSESSMENT & PLAN:  Assessment:   1.  Stage I breast cancer diagnosed in August 1999.  She remains without evidence of recurrence.  2.  Former smoker with COPD. It has been 9 years since she quit smoking, after 35 pack years.  The annual low dose lung cancer screening CT scan has now found a lung cancer and this appears to be a stage IA2 (T1b N0 M0) with no adenopathy and pathology reveals a squamous cell carcinoma.  Pulmonary function tests revealed moderate obstruction with an FEV1 of 2.09 L, 75% of predicted.  3.  Non-small cell lung cancer measuring 15.9 mm and positive on PET but this appears to be a stage I lesion.  Pathology reveals a squamous cell carcinoma  We have discussed the risks and benefits of surgery versus stereotactic radiation. She does have some degree of COPD, felt to be mild to moderate based on recent pulmonary function tests. She was given 5 treatments and finished that 4 weeks ago with Dr. Mitzi Hansen, he is agreeable to her continuing her follow-up here.    Plan:  She has a CT scan of the chest on 12/16/22 which revealed  peripheral ground-glass and airspace consolidation within the anterior basal right upper lobe and right middle lobe, rounded mass like area of architectural distortion is also new  extending along the major fissure measuring 4.4 x 2.9 cm and this shows post radiation changes and the underlying subpleural nodule within the anterior superior right middle lobe is partially obscured by these changes. This measures 1.1 x 0.9 cm, compared with 1.4 x 1.3 cm previously.     I told her that the scans were as expected and there would not be any need for  a PET scan now. Her husband asks who she should see for her generalized body aches, sleep tremors, back aches which could be due to fibromyalgia.  She is concerned that she might have Parkinson's disease and brings in a list of symptoms, many of which she has.  I advised her to see a rheumatologist and she said she will think about it. She says she has tremors, slow shuffling gait but I did not observe symptoms of Parkinsons.  Her hemoglobin is 12.4, WBC is 7.1, and platelet count is 204,000 on 12/16/2022. Her CMP is normal with a blood sugar of 105. Her CEA is 3.7 which is normal. I will see her  back in 3 months with CBC, CMP, CEA, and CT scan of the chest.  I discussed the risks and benefits of all approaches and answered their questions.  I have discussed this at length with her and her husband, and they understand and agree with this plan of care.   I provided 30 minutes of time during this this encounter., with over 50% spent in face to face discussion and counseling.  Dellia Beckwith, MD [facility] [address] [place] [address] Dept: [phone] Dept Fax: [phone]     I,Oluwatobi Asade,acting as a scribe for Dellia Beckwith, MD.,have documented all relevant documentation on the behalf of Dellia Beckwith, MD,as directed by  Dellia Beckwith, MD while in the presence of Dellia Beckwith, MD.

## 2022-12-19 LAB — CEA: CEA: 3.7

## 2023-01-09 ENCOUNTER — Encounter: Payer: Self-pay | Admitting: Oncology

## 2023-01-22 ENCOUNTER — Telehealth: Payer: Self-pay

## 2023-01-22 NOTE — Telephone Encounter (Signed)
Pt LVM concerning few problems that have occurred. She wants to know if she needs to see you or another physician? Last Sunday, 02/19/23 she started "having a lot of bleeding, like I was on my period. I'm 62 years old. Abdominal pain from lower left to right side. I also found a lump on my back. It isn't painful, it doesn't move. I don't know if these things are related or not". Please advise.

## 2023-02-10 ENCOUNTER — Telehealth: Payer: Self-pay | Admitting: Hematology and Oncology

## 2023-02-10 NOTE — Telephone Encounter (Signed)
02/10/23 Spoke with patient and rescheduled CT SCAN to 03/13/23@2pm .Arrive at 1pm.

## 2023-02-25 ENCOUNTER — Other Ambulatory Visit: Payer: Self-pay | Admitting: Cardiology

## 2023-03-13 LAB — BASIC METABOLIC PANEL
BUN: 14 (ref 4–21)
Creatinine: 0.7 (ref 0.5–1.1)

## 2023-03-13 LAB — COMPREHENSIVE METABOLIC PANEL: eGFR: >60

## 2023-03-19 NOTE — Progress Notes (Deleted)
[facility]  [address] [place],  [place]  [address] [phone]  Clinic Day:  03/19/2023  Referring physician: Gus Height, PA   CHIEF COMPLAINT:  CC: ***  Current Treatment:  ***  HISTORY OF PRESENT ILLNESS:  Dana [name] is a 62 y.o. female with a history of ***.  Oncology History  Malignant neoplasm of central portion of breast in female, estrogen receptor negative (HCC)  03/25/2016 Initial Diagnosis   Malignant neoplasm of central portion of breast in female, estrogen receptor negative (HCC)   Bronchogenic lung cancer, right (HCC)  07/26/2022 Initial Diagnosis   Bronchogenic lung cancer, right (HCC)   08/09/2022 Cancer Staging   Staging form: Lung, AJCC 8th Edition - Clinical stage from 08/09/2022: Stage IA2 (cT1b, cN0, cM0) - Signed by Dellia Beckwith, MD on 08/14/2022 Histopathologic type: Squamous cell carcinoma, NOS Stage prefix: Initial diagnosis Laterality: Right Tumor size (mm): 15.9 Lymph-vascular invasion (LVI): LVI not present (absent)/not identified Diagnostic confirmation: Positive histology Specimen type: Bronchial Biopsy Staged by: Managing physician Type of lung cancer: Resectable non-small cell lung cancer in inoperable patients treated with radiotherapy ECOG performance status: Grade 0 Symptoms: Absent Stage used in treatment planning: Yes National guidelines used in treatment planning: Yes Type of national guideline used in treatment planning: NCCN       INTERVAL HISTORY:  Dana [name] is here today for repeat clinical assessment. She denies fevers or chills. She denies pain. Her appetite is good. Her weight {Weight change:10426}.  REVIEW OF SYSTEMS:  Review of Systems - Oncology   VITALS:  There were no vitals taken for this visit.  Wt Readings from Last 3 Encounters:  12/18/22 170 lb 3.2 oz (77.2 kg)  10/18/22 171 lb 1.6 oz (77.6 kg)  08/15/22 175 lb 12.8 oz (79.7 kg)    There is no  height or weight on file to calculate BMI.  Performance status (ECOG): {CHL ONC [id number]  PHYSICAL EXAM:  Physical Exam  LABS:      Latest Ref Rng & Units 12/16/2022   12:00 AM 06/14/2022    8:36 AM 04/06/2021   12:00 AM  CBC  WBC  7.1     7.8  6.8   Hemoglobin 12.0 - 16.0 12.4     11.9  12.7   Hematocrit 36 - 46 37     36.9  38   Platelets 150 - 400 K/uL 204     213  188      This result is from an external source.      Latest Ref Rng & Units 03/13/2023   12:00 AM 12/16/2022   12:00 AM 06/14/2022    8:36 AM  CMP  Glucose 70 - 99 mg/dL   130   BUN 4 - 21 14     13     16    Creatinine 0.5 - 1.1 0.7     0.6     0.92   Sodium 137 - 147  138     140   Potassium 3.5 - 5.1 mEq/L  3.7     5.1   Chloride 99 - 108  105     105   CO2 13 - 22  28     24    Calcium 8.7 - 10.7  9.0     8.9   Total Protein 6.5 - 8.1 g/dL   7.3   Total Bilirubin 0.3 - 1.2 mg/dL   0.7   Alkaline Phos 25 -  125  111     98   AST 13 - 35  34     23   ALT 7 - 35 U/L  20     15      This result is from an external source.     Lab Results  Component Value Date   CEA1 2.8 06/14/2022   CEA 3.7 12/16/2022   /  CEA  Date Value Ref Range Status  12/16/2022 3.7  Final  06/14/2022 2.8 0.0 - 4.7 ng/mL Final    Comment:    (NOTE)                             Nonsmokers          <3.9                             Smokers             <5.6 [employer] Electrochemiluminescence Immunoassay (ECLIA) Values obtained with different assay methods or kits cannot be used interchangeably.  Results cannot be interpreted as absolute evidence of the presence or absence of malignant disease. Performed At: [facility] [address] [place], [place] [id number] Jolene Schimke MD [id number]    No results found for: "PSA1" No results found for: "CAN199" No results found for: "CAN125"  No results found for: "TOTALPROTELP", "ALBUMINELP", "A1GS", "A2GS", "BETS", "BETA2SER", "GAMS", "MSPIKE",  "SPEI" Lab Results  Component Value Date   TIBC 467 (H) 04/06/2020   FERRITIN 12 04/06/2020   IRONPCTSAT 10 (L) 04/06/2020   Lab Results  Component Value Date   LDH 163 04/06/2020    STUDIES:  No results found.    HISTORY:   Past Medical History:  Diagnosis Date   Asthma    Atypical chest pain 03/25/2016   Breast cancer (HCC)    COPD (chronic obstructive pulmonary disease) (HCC)    Dizziness 08/18/2017   Hx of adenomatous polyp of colon 08/09/2009   Hyperthyroidism 01/04/2016   Late effects of CVA (cerebrovascular accident) 03/25/2016   Lymphedema of right upper extremity 08/09/2020   Malignant neoplasm of central portion of breast in female, estrogen receptor negative (HCC) 03/25/2016   Obstructive sleep apnea syndrome 03/25/2016   Paroxysmal atrial fibrillation (HCC) 03/25/2016   Precordial chest pain 08/16/2020    Past Surgical History:  Procedure Laterality Date   ABDOMINAL HYSTERECTOMY     APPENDECTOMY     BREAST BIOPSY     BRONCHIAL BIOPSY  07/02/2022   Procedure: BRONCHIAL BIOPSIES;  Surgeon: Josephine Igo, DO;  Location: [facility];  Service: Pulmonary;;   BRONCHIAL NEEDLE ASPIRATION BIOPSY  07/02/2022   Procedure: BRONCHIAL NEEDLE ASPIRATION BIOPSIES;  Surgeon: Josephine Igo, DO;  Location: [facility];  Service: Pulmonary;;   FINE NEEDLE ASPIRATION  07/02/2022   Procedure: FINE NEEDLE ASPIRATION (FNA) LINEAR;  Surgeon: Josephine Igo, DO;  Location: [facility];  Service: Pulmonary;;   lymph node removal     PORTACATH PLACEMENT     portacath removal     TONSILLECTOMY     VIDEO BRONCHOSCOPY WITH ENDOBRONCHIAL ULTRASOUND  07/02/2022   Procedure: VIDEO BRONCHOSCOPY WITH ENDOBRONCHIAL ULTRASOUND;  Surgeon: Josephine Igo, DO;  Location: [facility];  Service: Pulmonary;;    Family History  Problem Relation Age of Onset   Hypertension Maternal Grandmother    Lung cancer Maternal Uncle    Hypertension  Mother    Ovarian cancer Paternal Aunt    Ovarian  cancer Paternal Grandfather     Social History:  reports that she quit smoking about 10 years ago. Her smoking use included cigarettes. She has never used smokeless tobacco. She reports current alcohol use. She reports that she does not use drugs.The patient is {Blank single:19197::"alone","accompanied by"} *** today.  Allergies: No Known Allergies  Current Medications: Current Outpatient Medications  Medication Sig Dispense Refill   apixaban (ELIQUIS) 5 MG TABS tablet Take 1 tablet (5 mg total) by mouth 2 (two) times daily. 180 tablet 3   atenolol (TENORMIN) 50 MG tablet Take 50 mg by mouth daily.     atorvastatin (LIPITOR) 80 MG tablet TAKE ONE TABLET BY MOUTH DAILY 90 tablet 3   diltiazem (CARDIZEM) 30 MG tablet Take 1 tablet (30 mg total) by mouth every 6 (six) hours as needed (palpitations). 30 tablet 1   ibuprofen (ADVIL) 200 MG tablet Take 400 mg by mouth every 6 (six) hours as needed for headache.     montelukast (SINGULAIR) 10 MG tablet Take 10 mg by mouth at bedtime.     pantoprazole (PROTONIX) 40 MG tablet Take 40 mg by mouth daily.     sertraline (ZOLOFT) 100 MG tablet Take 150 mg by mouth daily.      torsemide (DEMADEX) 10 MG tablet Take 1 tablet (10 mg total) by mouth daily. 90 tablet 0   traMADol (ULTRAM) 50 MG tablet Take 1 tablet (50 mg total) by mouth every 6 (six) hours as needed for moderate pain. 60 tablet 3   TRELEGY ELLIPTA 100-62.5-25 MCG/ACT AEPB Take 1 puff by mouth daily.     No current facility-administered medications for this visit.     ASSESSMENT & PLAN:   Assessment & Plan: Dana [name] is a 62 y.o. female with ***.  The patient understands the plans discussed today and is in agreement with them.  She knows to contact our office if she develops concerns prior to her next appointment.     I provided *** minutes of face-to-face time during this encounter and > 50% was spent counseling as documented under my assessment and plan.    Adah Perl,  PA-C  [facility] AT [facility] [address] [place] [address] Dept: [phone] Dept Fax: [phone]   No orders of the defined types were placed in this encounter.

## 2023-03-20 ENCOUNTER — Inpatient Hospital Stay: Payer: Medicare Other | Admitting: Hematology and Oncology

## 2023-03-24 ENCOUNTER — Telehealth: Payer: Self-pay

## 2023-03-24 ENCOUNTER — Inpatient Hospital Stay: Payer: Medicare Other | Admitting: Hematology and Oncology

## 2023-03-24 NOTE — Progress Notes (Deleted)
[facility]  [address] [place],  [place]  [address] [phone]  Clinic Day:  03/24/2023  Referring physician: Gus Height, PA   CHIEF COMPLAINT:  CC: ***  Current Treatment:  ***  HISTORY OF PRESENT ILLNESS:  Dana [name] is a 62 y.o. female with a history of ***.  Oncology History  Malignant neoplasm of central portion of breast in female, estrogen receptor negative (HCC)  03/25/2016 Initial Diagnosis   Malignant neoplasm of central portion of breast in female, estrogen receptor negative (HCC)   Bronchogenic lung cancer, right (HCC)  07/26/2022 Initial Diagnosis   Bronchogenic lung cancer, right (HCC)   08/09/2022 Cancer Staging   Staging form: Lung, AJCC 8th Edition - Clinical stage from 08/09/2022: Stage IA2 (cT1b, cN0, cM0) - Signed by Dellia Beckwith, MD on 08/14/2022 Histopathologic type: Squamous cell carcinoma, NOS Stage prefix: Initial diagnosis Laterality: Right Tumor size (mm): 15.9 Lymph-vascular invasion (LVI): LVI not present (absent)/not identified Diagnostic confirmation: Positive histology Specimen type: Bronchial Biopsy Staged by: Managing physician Type of lung cancer: Resectable non-small cell lung cancer in inoperable patients treated with radiotherapy ECOG performance status: Grade 0 Symptoms: Absent Stage used in treatment planning: Yes National guidelines used in treatment planning: Yes Type of national guideline used in treatment planning: NCCN       INTERVAL HISTORY:  Jadeyn is here today for repeat clinical assessment. She denies fevers or chills. She denies pain. Her appetite is good. Her weight {Weight change:10426}.  REVIEW OF SYSTEMS:  Review of Systems - Oncology   VITALS:  There were no vitals taken for this visit.  Wt Readings from Last 3 Encounters:  12/18/22 170 lb 3.2 oz (77.2 kg)  10/18/22 171 lb 1.6 oz (77.6 kg)  08/15/22 175 lb 12.8 oz (79.7 kg)    There is no  height or weight on file to calculate BMI.  Performance status (ECOG): {CHL ONC [id number]  PHYSICAL EXAM:  Physical Exam  LABS:      Latest Ref Rng & Units 12/16/2022   12:00 AM 06/14/2022    8:36 AM 04/06/2021   12:00 AM  CBC  WBC  7.1     7.8  6.8   Hemoglobin 12.0 - 16.0 12.4     11.9  12.7   Hematocrit 36 - 46 37     36.9  38   Platelets 150 - 400 K/uL 204     213  188      This result is from an external source.      Latest Ref Rng & Units 03/13/2023   12:00 AM 12/16/2022   12:00 AM 06/14/2022    8:36 AM  CMP  Glucose 70 - 99 mg/dL   914   BUN 4 - 21 14     13     16    Creatinine 0.5 - 1.1 0.7     0.6     0.92   Sodium 137 - 147  138     140   Potassium 3.5 - 5.1 mEq/L  3.7     5.1   Chloride 99 - 108  105     105   CO2 13 - 22  28     24    Calcium 8.7 - 10.7  9.0     8.9   Total Protein 6.5 - 8.1 g/dL   7.3   Total Bilirubin 0.3 - 1.2 mg/dL   0.7   Alkaline Phos 25 -  125  111     98   AST 13 - 35  34     23   ALT 7 - 35 U/L  20     15      This result is from an external source.     Lab Results  Component Value Date   CEA1 2.8 06/14/2022   CEA 3.7 12/16/2022   /  CEA  Date Value Ref Range Status  12/16/2022 3.7  Final  06/14/2022 2.8 0.0 - 4.7 ng/mL Final    Comment:    (NOTE)                             Nonsmokers          <3.9                             Smokers             <5.6 [employer] Electrochemiluminescence Immunoassay (ECLIA) Values obtained with different assay methods or kits cannot be used interchangeably.  Results cannot be interpreted as absolute evidence of the presence or absence of malignant disease. Performed At: [facility] [address] [place], [place] [id number] Jolene Schimke MD [id number]    No results found for: "PSA1" No results found for: "CAN199" No results found for: "CAN125"  No results found for: "TOTALPROTELP", "ALBUMINELP", "A1GS", "A2GS", "BETS", "BETA2SER", "GAMS", "MSPIKE",  "SPEI" Lab Results  Component Value Date   TIBC 467 (H) 04/06/2020   FERRITIN 12 04/06/2020   IRONPCTSAT 10 (L) 04/06/2020   Lab Results  Component Value Date   LDH 163 04/06/2020    STUDIES:  No results found.    HISTORY:   Past Medical History:  Diagnosis Date  . Asthma   . Atypical chest pain 03/25/2016  . Breast cancer (HCC)   . COPD (chronic obstructive pulmonary disease) (HCC)   . Dizziness 08/18/2017  . Hx of adenomatous polyp of colon 08/09/2009  . Hyperthyroidism 01/04/2016  . Late effects of CVA (cerebrovascular accident) 03/25/2016  . Lymphedema of right upper extremity 08/09/2020  . Malignant neoplasm of central portion of breast in female, estrogen receptor negative (HCC) 03/25/2016  . Obstructive sleep apnea syndrome 03/25/2016  . Paroxysmal atrial fibrillation (HCC) 03/25/2016  . Precordial chest pain 08/16/2020    Past Surgical History:  Procedure Laterality Date  . ABDOMINAL HYSTERECTOMY    . APPENDECTOMY    . BREAST BIOPSY    . BRONCHIAL BIOPSY  07/02/2022   Procedure: BRONCHIAL BIOPSIES;  Surgeon: Josephine Igo, DO;  Location: [facility];  Service: Pulmonary;;  . BRONCHIAL NEEDLE ASPIRATION BIOPSY  07/02/2022   Procedure: BRONCHIAL NEEDLE ASPIRATION BIOPSIES;  Surgeon: Josephine Igo, DO;  Location: [facility];  Service: Pulmonary;;  . FINE NEEDLE ASPIRATION  07/02/2022   Procedure: FINE NEEDLE ASPIRATION (FNA) LINEAR;  Surgeon: Josephine Igo, DO;  Location: [facility];  Service: Pulmonary;;  . lymph node removal    . PORTACATH PLACEMENT    . portacath removal    . TONSILLECTOMY    . VIDEO BRONCHOSCOPY WITH ENDOBRONCHIAL ULTRASOUND  07/02/2022   Procedure: VIDEO BRONCHOSCOPY WITH ENDOBRONCHIAL ULTRASOUND;  Surgeon: Josephine Igo, DO;  Location: [facility];  Service: Pulmonary;;    Family History  Problem Relation Age of Onset  . Hypertension Maternal Grandmother   . Lung cancer Maternal Uncle   . Hypertension  Mother   . Ovarian  cancer Paternal Aunt   . Ovarian cancer Paternal Grandfather     Social History:  reports that she quit smoking about 10 years ago. Her smoking use included cigarettes. She has never used smokeless tobacco. She reports current alcohol use. She reports that she does not use drugs.The patient is {Blank single:19197::"alone","accompanied by"} *** today.  Allergies: No Known Allergies  Current Medications: Current Outpatient Medications  Medication Sig Dispense Refill  . apixaban (ELIQUIS) 5 MG TABS tablet Take 1 tablet (5 mg total) by mouth 2 (two) times daily. 180 tablet 3  . atenolol (TENORMIN) 50 MG tablet Take 50 mg by mouth daily.    Marland Kitchen atorvastatin (LIPITOR) 80 MG tablet TAKE ONE TABLET BY MOUTH DAILY 90 tablet 3  . diltiazem (CARDIZEM) 30 MG tablet Take 1 tablet (30 mg total) by mouth every 6 (six) hours as needed (palpitations). 30 tablet 1  . ibuprofen (ADVIL) 200 MG tablet Take 400 mg by mouth every 6 (six) hours as needed for headache.    . montelukast (SINGULAIR) 10 MG tablet Take 10 mg by mouth at bedtime.    . pantoprazole (PROTONIX) 40 MG tablet Take 40 mg by mouth daily.    . sertraline (ZOLOFT) 100 MG tablet Take 150 mg by mouth daily.     Marland Kitchen torsemide (DEMADEX) 10 MG tablet Take 1 tablet (10 mg total) by mouth daily. 90 tablet 0  . traMADol (ULTRAM) 50 MG tablet Take 1 tablet (50 mg total) by mouth every 6 (six) hours as needed for moderate pain. 60 tablet 3  . TRELEGY ELLIPTA 100-62.5-25 MCG/ACT AEPB Take 1 puff by mouth daily.     No current facility-administered medications for this visit.     ASSESSMENT & PLAN:   Assessment & Plan: TANNISHA MENDES is a 62 y.o. female with ***.  The patient understands the plans discussed today and is in agreement with them.  She knows to contact our office if she develops concerns prior to her next appointment.     I provided *** minutes of face-to-face time during this encounter and > 50% was spent counseling as documented under  my assessment and plan.    Adah Perl, PA-C  [facility] AT [facility] [address] [place] [address] Dept: [phone] Dept Fax: [phone]   No orders of the defined types were placed in this encounter.

## 2023-03-24 NOTE — Telephone Encounter (Signed)
-----   Message from Adah Perl sent at 03/24/2023  8:57 AM EDT ----- I still don't have her CT chest report from 03/13/23 at [facility] if you would call radiology when you have a chance. Thanks

## 2023-03-24 NOTE — Telephone Encounter (Signed)
[facility] radiology called they will make it a priority and send results.

## 2023-03-27 ENCOUNTER — Inpatient Hospital Stay: Payer: Medicare Other | Attending: Hematology and Oncology | Admitting: Hematology and Oncology

## 2023-03-27 ENCOUNTER — Inpatient Hospital Stay: Payer: Medicare Other

## 2023-03-27 ENCOUNTER — Encounter: Payer: Self-pay | Admitting: Hematology and Oncology

## 2023-03-27 VITALS — BP 181/80 | HR 56 | Temp 98.2°F | Resp 18 | Ht 68.0 in | Wt 171.0 lb

## 2023-03-27 DIAGNOSIS — J449 Chronic obstructive pulmonary disease, unspecified: Secondary | ICD-10-CM | POA: Insufficient documentation

## 2023-03-27 DIAGNOSIS — Z1231 Encounter for screening mammogram for malignant neoplasm of breast: Secondary | ICD-10-CM

## 2023-03-27 DIAGNOSIS — Z8041 Family history of malignant neoplasm of ovary: Secondary | ICD-10-CM | POA: Diagnosis not present

## 2023-03-27 DIAGNOSIS — Z171 Estrogen receptor negative status [ER-]: Secondary | ICD-10-CM | POA: Diagnosis not present

## 2023-03-27 DIAGNOSIS — Z9221 Personal history of antineoplastic chemotherapy: Secondary | ICD-10-CM | POA: Insufficient documentation

## 2023-03-27 DIAGNOSIS — Z90710 Acquired absence of both cervix and uterus: Secondary | ICD-10-CM | POA: Insufficient documentation

## 2023-03-27 DIAGNOSIS — L989 Disorder of the skin and subcutaneous tissue, unspecified: Secondary | ICD-10-CM | POA: Diagnosis not present

## 2023-03-27 DIAGNOSIS — C3491 Malignant neoplasm of unspecified part of right bronchus or lung: Secondary | ICD-10-CM

## 2023-03-27 DIAGNOSIS — C3411 Malignant neoplasm of upper lobe, right bronchus or lung: Secondary | ICD-10-CM | POA: Insufficient documentation

## 2023-03-27 DIAGNOSIS — R079 Chest pain, unspecified: Secondary | ICD-10-CM | POA: Diagnosis not present

## 2023-03-27 DIAGNOSIS — Z801 Family history of malignant neoplasm of trachea, bronchus and lung: Secondary | ICD-10-CM | POA: Diagnosis not present

## 2023-03-27 DIAGNOSIS — C50111 Malignant neoplasm of central portion of right female breast: Secondary | ICD-10-CM | POA: Diagnosis not present

## 2023-03-27 DIAGNOSIS — M549 Dorsalgia, unspecified: Secondary | ICD-10-CM | POA: Insufficient documentation

## 2023-03-27 DIAGNOSIS — Z87891 Personal history of nicotine dependence: Secondary | ICD-10-CM | POA: Diagnosis not present

## 2023-03-27 DIAGNOSIS — Z923 Personal history of irradiation: Secondary | ICD-10-CM | POA: Insufficient documentation

## 2023-03-27 DIAGNOSIS — Z853 Personal history of malignant neoplasm of breast: Secondary | ICD-10-CM | POA: Insufficient documentation

## 2023-03-27 LAB — COMPREHENSIVE METABOLIC PANEL (ASHBORO CC SCANNED REPORT)

## 2023-03-27 NOTE — Progress Notes (Signed)
[facility]  [address] [place],  [place]  [address] [phone]  Clinic Day:  03/27/2023  Referring physician: Gus Height, PA   CHIEF COMPLAINT:  CC: Stage IA2 squamous cell carcinoma of the lung  Current Treatment: Observation after stereotactic radiation  HISTORY OF PRESENT ILLNESS:  Dana [name] is a 62 y.o. female with a history of stage I hormone receptor positive right breast cancer diagnosed in August 1999.  This was a 1.1 cm invasive ductal carcinoma, treated with lumpectomy, CMF chemotherapy, and radiation.  She did not tolerate tamoxifen, but has never had evidence of recurrence.  We began seeing her in August 2004, when she moved to the area.  She had been on yearly follow-up, but was lost to follow-up from April 2014 to April 2016.Marland Kitchen  CT chest, abdomen and pelvis in April 2016 did not reveal any evidence of malignancy, but there was a 6 mm nodule in the posterior right upper lobe on the CT chest.  CT chest and July 2016 revealed resolution of the 6 mm groundglass nodule in the right upper lobe.  Due to her personal history of breast cancer diagnosed at age 40, we have recommended genetic testing with the [facility] Hereditary Cancer Panel testing in July 2016.  This did not reveal any clinically significant mutations or variants of uncertain significance.    She underwent total abdominal hysterectomy for dysfunctional uterine bleeding, but her ovaries are intact.  She also has atrial fibrillation, history of pneumonia, mild to moderate tricuspid regurgitation, and history of prior stroke in 2014.  She had a squamous cell carcinoma removed from her right shoulder, and a squamous cell carcinoma in situ removed from the left anterior chest. She has a history of peptic ulcer disease.  She has had some anxiety and depression.  She undergoes basically annual CT chest for lung cancer screening due to her 35-pack-year history of smoking.   She quit smoking about 10 years ago.   She developed a newly diagnosed lung cancer in January 2024. The lung cancer screening CT scan on January 8 revealed a new large irregular subpleural solid pulmonary nodule of the right middle lobe measuring 15.9 mm in mean diameter, for a lung RADS 4B, suspicious.  PET scan revealed a hypermetabolic right upper lobe pulmonary nodule most consistent with bronchogenic carcinoma. There was no evidence of metastatic adenopathy or distant metastatic disease.  She underwent bronchoscopy and biopsies with Dr. Tonia Brooms.  Cytology revealed non-small cell lung cancer with negative lymph node.   MRI of the brain did not reveal any evidence of intracranial metastasis.  Pulmonary function tests revealed a moderate degree of COPD with an FEV1 of 2.09 L, 75% of predicted.  She therefore had a clinical stage I non-small cell lung cancer.  We discussed the options of surgical resection versus stereotactic radiation and she chose the latter. She was treated by Dr. Mitzi Hansen at [facility] and tolerated this well.  She completed stereotactic radiation in April.  CT chest in July revealed interval development of peripheral groundglass and airspace consolidation within the anterior basal right upper lobe and right middle lobe with a round ed dislike area of architectural distortion along the major fissure measuring 4.4 x 2.9 cm favored to represent postradiation changes.  The underlying subpleural nodule was obscured but decreased in size from 1.4 x 1.3 cm to 1.1 x 0.9 cm.  A borderline enlarged right lymph node interval increase in the right paratracheal and subcarinal  lymph nodes was felt to possibly be reactive.  Attention on follow-up was recommended   Oncology History  Malignant neoplasm of central portion of breast in female, estrogen receptor negative (HCC)  03/25/2016 Initial Diagnosis   Malignant neoplasm of central portion of breast in female, estrogen receptor negative (HCC)    Bronchogenic lung cancer, right (HCC)  07/26/2022 Initial Diagnosis   Bronchogenic lung cancer, right (HCC)   08/09/2022 Cancer Staging   Staging form: Lung, AJCC 8th Edition - Clinical stage from 08/09/2022: Stage IA2 (cT1b, cN0, cM0) - Signed by Dellia Beckwith, MD on 08/14/2022 Histopathologic type: Squamous cell carcinoma, NOS Stage prefix: Initial diagnosis Laterality: Right Tumor size (mm): 15.9 Lymph-vascular invasion (LVI): LVI not present (absent)/not identified Diagnostic confirmation: Positive histology Specimen type: Bronchial Biopsy Staged by: Managing physician Type of lung cancer: Resectable non-small cell lung cancer in inoperable patients treated with radiotherapy ECOG performance status: Grade 0 Symptoms: Absent Stage used in treatment planning: Yes National guidelines used in treatment planning: Yes Type of national guideline used in treatment planning: NCCN       INTERVAL HISTORY:  Dana [name] is here today for repeat clinical assessment.  She reports fatigue not relieved with rest, but states she does not sleep well.  She continues to struggle with anxiety and depression, for which medication is fairly effective.  She has been having pain in her upper back to anterior chest for 2 to 3 months.  She states mainly her upper back hurts when she lays down, but goes through to her chest.  This does not last for an extended period of time.  She denies current pain.  She is scheduled to see Dr. Bing Matter in the next few weeks.  She has chronic arthralgias, for which she takes tramadol 100 mg at bedtime.  She reports some new skin lesions on her neck and scalp and is scheduled to see dermatology. She denies fevers or chills. She denies pain. Her appetite is somewhat decreased, but she is drinking Slimfast and Carnation instant breakfast. Her weight has increased 1 pounds over last 3 months .  Prior to her visit today, she underwent repeat CT chest  REVIEW OF SYSTEMS:  Review  of Systems  Constitutional:  Positive for fatigue. Negative for appetite change, chills, fever and unexpected weight change.  HENT:   Negative for lump/mass, mouth sores and sore throat.   Respiratory:  Negative for chest tightness, cough and shortness of breath.   Cardiovascular:  Positive for chest pain (mid back to anterior chest). Negative for leg swelling and palpitations.  Gastrointestinal:  Negative for abdominal pain, constipation, diarrhea, nausea and vomiting.  Genitourinary:  Negative for difficulty urinating, dysuria, frequency and hematuria.   Musculoskeletal:  Positive for arthralgias and back pain (mid back to anterior chest). Negative for gait problem and myalgias.  Skin:  Negative for rash.  Neurological:  Negative for dizziness, gait problem, headaches and light-headedness.  Hematological:  Negative for adenopathy. Does not bruise/bleed easily.  Psychiatric/Behavioral:  Positive for depression and sleep disturbance. The patient is nervous/anxious.      VITALS:  Blood pressure (!) 181/80, pulse (!) 56, temperature 98.2 F (36.8 C), temperature source Oral, resp. rate 18, height 5\' 8"  (1.727 m), weight 171 lb (77.6 kg), SpO2 98%.  Wt Readings from Last 3 Encounters:  03/27/23 171 lb (77.6 kg)  12/18/22 170 lb 3.2 oz (77.2 kg)  10/18/22 171 lb 1.6 oz (77.6 kg)    Body mass index is 26 kg/m.  Performance  status (ECOG): 1 - Symptomatic but completely ambulatory  PHYSICAL EXAM:  Physical Exam Vitals and nursing note reviewed.  Constitutional:      General: She is not in acute distress.    Appearance: Normal appearance.  HENT:     Head: Normocephalic and atraumatic.     Mouth/Throat:     Mouth: Mucous membranes are moist.     Pharynx: Oropharynx is clear. No oropharyngeal exudate or posterior oropharyngeal erythema.  Eyes:     General: No scleral icterus.    Extraocular Movements: Extraocular movements intact.     Conjunctiva/sclera: Conjunctivae normal.      Pupils: Pupils are equal, round, and reactive to light.  Cardiovascular:     Rate and Rhythm: Normal rate and regular rhythm.     Heart sounds: Normal heart sounds. No murmur heard.    No friction rub. No gallop.  Pulmonary:     Effort: Pulmonary effort is normal.     Breath sounds: Normal breath sounds. No wheezing, rhonchi or rales.  Chest:     Chest wall: No mass or tenderness.  Breasts:    Right: Normal. No inverted nipple, mass, nipple discharge or skin change.     Left: Normal. No inverted nipple, mass, nipple discharge or skin change.  Abdominal:     General: There is no distension.     Palpations: Abdomen is soft. There is no hepatomegaly, splenomegaly or mass.     Tenderness: There is no abdominal tenderness.  Musculoskeletal:        General: Normal range of motion.     Cervical back: Normal range of motion and neck supple. No tenderness or bony tenderness.     Right lower leg: No edema.     Left lower leg: No edema.  Lymphadenopathy:     Cervical: No cervical adenopathy.     Upper Body:     Right upper body: No supraclavicular or axillary adenopathy.     Left upper body: No supraclavicular or axillary adenopathy.     Lower Body: No right inguinal adenopathy. No left inguinal adenopathy.  Skin:    General: Skin is warm and dry.     Coloration: Skin is not jaundiced.     Findings: Lesion present. No rash.     Comments: 1 cm raised, erythematous with overlying crusting the right upper back adjacent to the previous incision.  1 cm raised erythematous lesion of the anterior scalp which is tender.  2 small erythematous lesions of the right neck.  Neurological:     Mental Status: She is alert and oriented to person, place, and time.     Cranial Nerves: No cranial nerve deficit.  Psychiatric:        Mood and Affect: Mood normal.        Behavior: Behavior normal.        Thought Content: Thought content normal.     LABS:      Latest Ref Rng & Units 12/16/2022   12:00 AM  06/14/2022    8:36 AM 04/06/2021   12:00 AM  CBC  WBC  7.1     7.8  6.8   Hemoglobin 12.0 - 16.0 12.4     11.9  12.7   Hematocrit 36 - 46 37     36.9  38   Platelets 150 - 400 K/uL 204     213  188      This result is from an external source.  Latest Ref Rng & Units 03/13/2023   12:00 AM 12/16/2022   12:00 AM 06/14/2022    8:36 AM  CMP  Glucose 70 - 99 mg/dL   295   BUN 4 - 21 14     13     16    Creatinine 0.5 - 1.1 0.7     0.6     0.92   Sodium 137 - 147  138     140   Potassium 3.5 - 5.1 mEq/L  3.7     5.1   Chloride 99 - 108  105     105   CO2 13 - 22  28     24    Calcium 8.7 - 10.7  9.0     8.9   Total Protein 6.5 - 8.1 g/dL   7.3   Total Bilirubin 0.3 - 1.2 mg/dL   0.7   Alkaline Phos 25 - 125  111     98   AST 13 - 35  34     23   ALT 7 - 35 U/L  20     15      This result is from an external source.     Lab Results  Component Value Date   CEA1 2.8 06/14/2022   CEA 3.7 12/16/2022   /  CEA  Date Value Ref Range Status  12/16/2022 3.7  Final  06/14/2022 2.8 0.0 - 4.7 ng/mL Final    Comment:    (NOTE)                             Nonsmokers          <3.9                             Smokers             <5.6 [employer] Electrochemiluminescence Immunoassay (ECLIA) Values obtained with different assay methods or kits cannot be used interchangeably.  Results cannot be interpreted as absolute evidence of the presence or absence of malignant disease. Performed At: [facility] [address] [place], [place] [id number] Jolene Schimke MD NU:[id number]    No results found for: "PSA1" No results found for: "CAN199" No results found for: "CAN125"  No results found for: "TOTALPROTELP", "ALBUMINELP", "A1GS", "A2GS", "BETS", "BETA2SER", "GAMS", "MSPIKE", "SPEI" Lab Results  Component Value Date   TIBC 467 (H) 04/06/2020   FERRITIN 12 04/06/2020   IRONPCTSAT 10 (L) 04/06/2020   Lab Results  Component Value Date   LDH 163 04/06/2020     STUDIES:     Patient Name: Dana [name], Dana [name]  MRN: [id number] LOC: DI   DOB: 1960-07-03 AGE: 75  Order Date:03/11/23 Date of Service:03/13/23   Account # [id number]  Report # [id number]   Ord Physician: Gery Pray MD  Exam # [id number]      Exam(s): [id number] CT/CT CHEST W/ CM  CLINICAL DATA: Lung cancer restaging. Remote history of breast  cancer.  * Tracking Code: BO *  EXAM:  CT CHEST WITH CONTRAST  TECHNIQUE:  Multidetector CT imaging of the chest was performed during  intravenous contrast administration.  RADIATION DOSE REDUCTION: This exam was performed according to the  departmental dose-optimization program which includes automated  exposure control, adjustment of the mA and/or kV according to  patient size and/or use of iterative reconstruction technique.  CONTRAST: 60 cc Isovue  370  COMPARISON: 12/16/2022  FINDINGS:  Cardiovascular: Coronary, aortic arch, and branch vessel  atherosclerotic vascular disease. Linear and web-like chronic  residua from remote pulmonary embolus in the right lower lobe noted  for example on images 54 through 60 of series 2. No findings of  acute pulmonary embolus (although today's exam was not protocol  specifically to assess for pulmonary embolus).  Mediastinum/Nodes: Right lower paratracheal node 0.9 cm in short  axis on image 43 series 2, formerly 1.0 cm. Right hilar node 1.1 cm  in short axis on image 49 series 2, formerly 0.9 cm.  Small to moderate hiatal hernia. Prior right axillary dissection.  Lungs/Pleura: Biapical pleuroparenchymal scarring. Centrilobular  emphysema. Stable scarring or atelectasis in the posterior basal  segment right lower lobe.  Subpleural thickening and consolidation anterolaterally in the right  middle lobe. Some of this may be from prior radiation therapy in the  region. The previous more masslike region posteriorly along this  process has notably improved, previously 4.4 by 2.9 cm and  currently  about 1.5 by 1.9 cm on image 68 series 301. We have also been  following a small area of nodularity along the subpleural thickening  which currently measures about 1.0 by 0.8 cm, previously 1.1 by 0.9  cm.  Upper Abdomen: Unremarkable  Musculoskeletal: Right mastectomy.  IMPRESSION:  1. The previous more masslike region posteriorly along the  subpleural thickening in the right middle lobe has notably improved,  currently about 1.5 by 1.9 cm.  2. The small area of nodularity along the subpleural thickening in  the right middle lobe has also been following and is slightly  smaller.  3. Right lower paratracheal node 0.9 cm in short axis, formerly 1.0  cm. Right hilar node 1.1 cm in short axis (mildly enlarged),  formerly 0.9 cm.  4. Small to moderate hiatal hernia.  5. Coronary, aortic arch, and branch vessel atherosclerotic vascular  disease.  6. Linear and web-like chronic residua from remote pulmonary embolus  in the right lower lobe.  Aortic Atherosclerosis (ICD10-I70.0) and Emphysema (ICD10-J43.9).    HISTORY:   Past Medical History:  Diagnosis Date   Asthma    Atypical chest pain 03/25/2016   Breast cancer (HCC)    COPD (chronic obstructive pulmonary disease) (HCC)    Dizziness 08/18/2017   Hx of adenomatous polyp of colon 08/09/2009   Hyperthyroidism 01/04/2016   Late effects of CVA (cerebrovascular accident) 03/25/2016   Lymphedema of right upper extremity 08/09/2020   Malignant neoplasm of central portion of breast in female, estrogen receptor negative (HCC) 03/25/2016   Obstructive sleep apnea syndrome 03/25/2016   Paroxysmal atrial fibrillation (HCC) 03/25/2016   Precordial chest pain 08/16/2020    Past Surgical History:  Procedure Laterality Date   ABDOMINAL HYSTERECTOMY     APPENDECTOMY     BREAST BIOPSY     BRONCHIAL BIOPSY  07/02/2022   Procedure: BRONCHIAL BIOPSIES;  Surgeon: Josephine Igo, DO;  Location: [facility];  Service: Pulmonary;;    BRONCHIAL NEEDLE ASPIRATION BIOPSY  07/02/2022   Procedure: BRONCHIAL NEEDLE ASPIRATION BIOPSIES;  Surgeon: Josephine Igo, DO;  Location: [facility];  Service: Pulmonary;;   FINE NEEDLE ASPIRATION  07/02/2022   Procedure: FINE NEEDLE ASPIRATION (FNA) LINEAR;  Surgeon: Josephine Igo, DO;  Location: [facility];  Service: Pulmonary;;   lymph node removal     PORTACATH PLACEMENT     portacath removal     TONSILLECTOMY     VIDEO BRONCHOSCOPY WITH  ENDOBRONCHIAL ULTRASOUND  07/02/2022   Procedure: VIDEO BRONCHOSCOPY WITH ENDOBRONCHIAL ULTRASOUND;  Surgeon: Josephine Igo, DO;  Location: [facility];  Service: Pulmonary;;    Family History  Problem Relation Age of Onset   Hypertension Maternal Grandmother    Lung cancer Maternal Uncle    Hypertension Mother    Ovarian cancer Paternal Aunt    Ovarian cancer Paternal Grandfather     Social History:  reports that she quit smoking about 10 years ago. Her smoking use included cigarettes. She has never used smokeless tobacco. She reports current alcohol use. She reports that she does not use drugs.The patient is accompanied by her husband today.  Allergies: No Known Allergies  Current Medications: Current Outpatient Medications  Medication Sig Dispense Refill   methocarbamol (ROBAXIN) 500 MG tablet Take 500 mg by mouth 2 (two) times daily as needed.     apixaban (ELIQUIS) 5 MG TABS tablet Take 1 tablet (5 mg total) by mouth 2 (two) times daily. 180 tablet 3   atenolol (TENORMIN) 50 MG tablet Take 50 mg by mouth daily.     atorvastatin (LIPITOR) 80 MG tablet TAKE ONE TABLET BY MOUTH DAILY 90 tablet 3   diltiazem (CARDIZEM) 30 MG tablet Take 1 tablet (30 mg total) by mouth every 6 (six) hours as needed (palpitations). 30 tablet 1   ibuprofen (ADVIL) 200 MG tablet Take 400 mg by mouth every 6 (six) hours as needed for headache.     montelukast (SINGULAIR) 10 MG tablet Take 10 mg by mouth at bedtime.     pantoprazole (PROTONIX) 40 MG tablet  Take 40 mg by mouth daily.     sertraline (ZOLOFT) 100 MG tablet Take 150 mg by mouth daily.      torsemide (DEMADEX) 10 MG tablet Take 1 tablet (10 mg total) by mouth daily. 90 tablet 0   traMADol (ULTRAM) 50 MG tablet Take 1 tablet (50 mg total) by mouth every 6 (six) hours as needed for moderate pain. 60 tablet 3   TRELEGY ELLIPTA 100-62.5-25 MCG/ACT AEPB Take 1 puff by mouth daily.     No current facility-administered medications for this visit.     ASSESSMENT & PLAN:   Assessment & Plan: Dana [name] is a 62 y.o. female with  1.  Remote history of hormone receptor positive right breast cancer treated with lumpectomy and adjuvant chemotherapy radiation therapy.  She did not tolerate tamoxifen.  Genetic testing was negative.  She will be due for bilateral screening mammogram in February.  2.  Stage IA2 non-small cell lung cancer.  The postradiation changes in the right upper lobe nearly resolved.  There is a minimal increase in a paratracheal lymph node, of uncertain significance.  This is unlikely to be PET sensitive, so we will plan to repeat a CT chest in 4 months.  3.  Unusual back/chest pain for months. She has know degenerative disease.  She could take the tramadol 30 minutes to an hour prior to lying down to see if this helps.  I recommended she keep her appointment with Dr. Bing Matter.  4.  Skin lesions concerning for cancer of the right upper back and anterior scalp.  She we will see her dermatologist as scheduled.   Will plan to see her back in 4 months with a CBC, comprehensive metabolic panel, CEA, CT chest and an bilateral screening mammogram.   The patient and her husband understand the plans discussed today and is in agreement with them.  She knows  to contact our office if she develops concerns prior to her next appointment.     I provided 40 minutes of face-to-face time during this encounter and > 50% was spent counseling as documented under my assessment and  plan.    Adah Perl, PA-C  [facility] [address] [place] [address] Dept: [phone] Dept Fax: [phone]   Orders Placed This Encounter  Procedures   CT CHEST W CONTRAST    Standing Status:   Future    Standing Expiration Date:   03/26/2024    Order Specific Question:   If indicated for the ordered procedure, I authorize the administration of contrast media per Radiology protocol    Answer:   Yes    Order Specific Question:   Preferred imaging location?    Answer:   External    Order Specific Question:   Radiology Contrast Protocol - do NOT remove file path    Answer:   [email or web address]   MM DIGITAL SCREENING BILATERAL    Standing Status:   Future    Standing Expiration Date:   03/26/2024    Scheduling Instructions:     RH    Order Specific Question:   Reason for exam:    Answer:   screening for breast cancer, h/o right breast cancer    Order Specific Question:   Preferred imaging location?    Answer:   External   CBC with Differential (Cancer Center Only)    Standing Status:   Future    Standing Expiration Date:   03/26/2024   CMP (Cancer Center only)    Standing Status:   Future    Standing Expiration Date:   03/26/2024   CEA    Standing Status:   Future    Standing Expiration Date:   03/26/2024    For left

## 2023-03-28 ENCOUNTER — Telehealth: Payer: Self-pay

## 2023-03-28 LAB — CEA: CEA: 3.2 ng/mL (ref 0.0–4.7)

## 2023-03-28 NOTE — Telephone Encounter (Signed)
-----   Message from Dana [name] sent at 03/27/2023  4:30 PM EDT ----- Please let her know her labs look good except for blood sugar 182

## 2023-03-28 NOTE — Telephone Encounter (Signed)
Detailed message left for patient.

## 2023-04-07 ENCOUNTER — Encounter: Payer: Self-pay | Admitting: Oncology

## 2023-04-21 ENCOUNTER — Ambulatory Visit: Payer: Medicare Other | Attending: Cardiology | Admitting: Cardiology

## 2023-04-21 ENCOUNTER — Encounter: Payer: Self-pay | Admitting: Cardiology

## 2023-04-21 VITALS — BP 120/70 | HR 54 | Ht 67.5 in | Wt 172.6 lb

## 2023-04-21 DIAGNOSIS — C50111 Malignant neoplasm of central portion of right female breast: Secondary | ICD-10-CM | POA: Diagnosis present

## 2023-04-21 DIAGNOSIS — E0590 Thyrotoxicosis, unspecified without thyrotoxic crisis or storm: Secondary | ICD-10-CM | POA: Diagnosis not present

## 2023-04-21 DIAGNOSIS — R072 Precordial pain: Secondary | ICD-10-CM | POA: Insufficient documentation

## 2023-04-21 DIAGNOSIS — I480 Paroxysmal atrial fibrillation: Secondary | ICD-10-CM | POA: Diagnosis not present

## 2023-04-21 DIAGNOSIS — I6990 Unspecified sequelae of unspecified cerebrovascular disease: Secondary | ICD-10-CM | POA: Diagnosis present

## 2023-04-21 DIAGNOSIS — Z171 Estrogen receptor negative status [ER-]: Secondary | ICD-10-CM | POA: Diagnosis present

## 2023-04-21 DIAGNOSIS — C3491 Malignant neoplasm of unspecified part of right bronchus or lung: Secondary | ICD-10-CM | POA: Diagnosis present

## 2023-04-21 DIAGNOSIS — J410 Simple chronic bronchitis: Secondary | ICD-10-CM | POA: Diagnosis not present

## 2023-04-21 NOTE — Patient Instructions (Signed)

## 2023-04-21 NOTE — Progress Notes (Signed)
Cardiology Office Note:    Date:  04/21/2023   ID:  Dana [name], DOB 1961-03-11, MRN [id number]  PCP:  Gus Height, PA  Cardiologist:  Gypsy Balsam, MD    Referring MD: Gus Height, [place]   Chief Complaint  Patient presents with   Chest Pain    History of Present Illness:    Dana [name] is a 62 y.o. female past medical history significant for breast cancer diagnosed in August 1999 recently diagnosed bronchogenic lung cancer on the right side, also paroxysmal atrial fibrillation CHADS2 Vascor equals 3, history of CVA, she is anticoag with Eliquis.  Comes today to months for follow-up she described to have some chest pain but is very atypical it happens when she lays down at night does not happen when she walks to the point where she does thinks.  Worse when she moves a certain way.  Pain going to the back but she did have CT of her chest done recently did not show any significant vascular pathology.  Otherwise she is doing well and she is happy denies have any palpitations  Past Medical History:  Diagnosis Date   Asthma    Atypical chest pain 03/25/2016   Breast cancer (HCC)    COPD (chronic obstructive pulmonary disease) (HCC)    Dizziness 08/18/2017   Hx of adenomatous polyp of colon 08/09/2009   Hyperthyroidism 01/04/2016   Late effects of CVA (cerebrovascular accident) 03/25/2016   Lymphedema of right upper extremity 08/09/2020   Malignant neoplasm of central portion of breast in female, estrogen receptor negative (HCC) 03/25/2016   Obstructive sleep apnea syndrome 03/25/2016   Paroxysmal atrial fibrillation (HCC) 03/25/2016   Precordial chest pain 08/16/2020    Past Surgical History:  Procedure Laterality Date   ABDOMINAL HYSTERECTOMY     APPENDECTOMY     BREAST BIOPSY     BRONCHIAL BIOPSY  07/02/2022   Procedure: BRONCHIAL BIOPSIES;  Surgeon: Josephine Igo, DO;  Location: [facility];  Service: Pulmonary;;   BRONCHIAL NEEDLE ASPIRATION BIOPSY   07/02/2022   Procedure: BRONCHIAL NEEDLE ASPIRATION BIOPSIES;  Surgeon: Josephine Igo, DO;  Location: [facility];  Service: Pulmonary;;   FINE NEEDLE ASPIRATION  07/02/2022   Procedure: FINE NEEDLE ASPIRATION (FNA) LINEAR;  Surgeon: Josephine Igo, DO;  Location: [facility];  Service: Pulmonary;;   lymph node removal     PORTACATH PLACEMENT     portacath removal     TONSILLECTOMY     VIDEO BRONCHOSCOPY WITH ENDOBRONCHIAL ULTRASOUND  07/02/2022   Procedure: VIDEO BRONCHOSCOPY WITH ENDOBRONCHIAL ULTRASOUND;  Surgeon: Josephine Igo, DO;  Location: [facility];  Service: Pulmonary;;    Current Medications: Current Meds  Medication Sig   apixaban (ELIQUIS) 5 MG TABS tablet Take 1 tablet (5 mg total) by mouth 2 (two) times daily.   atenolol (TENORMIN) 50 MG tablet Take 50 mg by mouth daily.   atorvastatin (LIPITOR) 80 MG tablet TAKE ONE TABLET BY MOUTH DAILY   diltiazem (CARDIZEM) 30 MG tablet Take 1 tablet (30 mg total) by mouth every 6 (six) hours as needed (palpitations).   ibuprofen (ADVIL) 200 MG tablet Take 400 mg by mouth every 6 (six) hours as needed for headache.   montelukast (SINGULAIR) 10 MG tablet Take 10 mg by mouth at bedtime.   pantoprazole (PROTONIX) 40 MG tablet Take 40 mg by mouth daily.   sertraline (ZOLOFT) 100 MG tablet Take 150 mg by mouth daily.    torsemide (DEMADEX) 10 MG tablet  Take 1 tablet (10 mg total) by mouth daily.   traMADol (ULTRAM) 50 MG tablet Take 1 tablet (50 mg total) by mouth every 6 (six) hours as needed for moderate pain.   TRELEGY ELLIPTA 100-62.5-25 MCG/ACT AEPB Take 1 puff by mouth daily.   [DISCONTINUED] methocarbamol (ROBAXIN) 500 MG tablet Take 500 mg by mouth 2 (two) times daily as needed for muscle spasms.     Allergies:   Patient has no known allergies.   Social History   Socioeconomic History   Marital status: Married    Spouse name: Not on file   Number of children: Not on file   Years of education: Not on file   Highest  education level: Not on file  Occupational History   Not on file  Tobacco Use   Smoking status: Former    Current packs/day: 0.00    Types: Cigarettes    Quit date: 02/24/2013    Years since quitting: 10.1   Smokeless tobacco: Never  Vaping Use   Vaping status: Never Used  Substance and Sexual Activity   Alcohol use: Yes    Comment: Once a year   Drug use: No   Sexual activity: Not on file  Other Topics Concern   Not on file  Social History Narrative   Not on file   Social Determinants of Health   Financial Resource Strain: Not on file  Food Insecurity: Not on file  Transportation Needs: Not on file  Physical Activity: Not on file  Stress: Not on file  Social Connections: Not on file     Family History: The patient's family history includes Hypertension in her maternal grandmother and mother; Lung cancer in her maternal uncle; Ovarian cancer in her paternal aunt and paternal grandfather. ROS:   Please see the history of present illness.    All 14 point review of systems negative except as described per history of present illness  EKGs/Labs/Other Studies Reviewed:    EKG Interpretation Date/Time:  Monday April 21 2023 14:45:32 EST Ventricular Rate:  54 PR Interval:  146 QRS Duration:  86 QT Interval:  490 QTC Calculation: 464 R Axis:   -4  Text Interpretation: Sinus bradycardia Otherwise normal ECG No previous ECGs available Confirmed by Gypsy Balsam [id number]) on 04/21/2023 2:49:29 PM    Recent Labs: 12/16/2022: ALT 20; Hemoglobin 12.4; Platelets 204; Potassium 3.7; Sodium 138 03/13/2023: BUN 14; Creatinine 0.7  Recent Lipid Panel    Component Value Date/Time   CHOL 153 08/21/2021 1309   TRIG 121 08/21/2021 1309   HDL 44 08/21/2021 1309   CHOLHDL 3.5 08/21/2021 1309   LDLCALC 87 08/21/2021 1309    Physical Exam:    VS:  BP 120/70 (BP Location: Left Arm, Patient Position: Sitting)   Pulse (!) 54   Ht 5' 7.5" (1.715 m)   Wt 172 lb 9.6 oz (78.3  kg)   SpO2 93%   BMI 26.63 kg/m     Wt Readings from Last 3 Encounters:  04/21/23 172 lb 9.6 oz (78.3 kg)  03/27/23 171 lb (77.6 kg)  12/18/22 170 lb 3.2 oz (77.2 kg)     GEN:  Well nourished, well developed in no acute distress HEENT: Normal NECK: No JVD; No carotid bruits LYMPHATICS: No lymphadenopathy CARDIAC: RRR, no murmurs, no rubs, no gallops RESPIRATORY:  Clear to auscultation without rales, wheezing or rhonchi  ABDOMEN: Soft, non-tender, non-distended MUSCULOSKELETAL:  No edema; No deformity  SKIN: Warm and dry LOWER EXTREMITIES: no swelling NEUROLOGIC:  Alert and oriented x 3 PSYCHIATRIC:  Normal affect   ASSESSMENT:    1. Paroxysmal atrial fibrillation (HCC)   2. Bronchogenic lung cancer, right (HCC)   3. Simple chronic bronchitis (HCC)   4. Hyperthyroidism   5. Malignant neoplasm of central portion of right breast in female, estrogen receptor negative (HCC)   6. Late effects of CVA (cerebrovascular accident)   7. Precordial chest pain    PLAN:    In order of problems listed above:  Paroxysmal atrial fibrillation, maintained sinus rhythm, she is anticoag with Eliquis 5 show continue dose is appropriate to her age weight and kidney function. Lung cancer follow-up excellently like always by our oncology team. COPD.  Noted. Late effect of CVA.  Stable no new issues. Chest pain very atypical not related to exercise previously coronary CT angio showed only luminal disease.  Continue conservative approach   Medication Adjustments/Labs and Tests Ordered: Current medicines are reviewed at length with the patient today.  Concerns regarding medicines are outlined above.  Orders Placed This Encounter  Procedures   EKG 12-Lead   Medication changes: No orders of the defined types were placed in this encounter.   Signed, Georgeanna Lea, MD, [facility] 04/21/2023 3:03 PM    [facility]

## 2023-04-28 ENCOUNTER — Other Ambulatory Visit: Payer: Self-pay | Admitting: Oncology

## 2023-04-28 DIAGNOSIS — R0789 Other chest pain: Secondary | ICD-10-CM

## 2023-05-27 ENCOUNTER — Ambulatory Visit: Payer: Medicare Other | Admitting: Hematology and Oncology

## 2023-06-06 ENCOUNTER — Other Ambulatory Visit: Payer: Self-pay | Admitting: Cardiology

## 2023-06-06 NOTE — Telephone Encounter (Signed)
 Rx refill sent to pharmacy.

## 2023-06-30 ENCOUNTER — Other Ambulatory Visit: Payer: Self-pay

## 2023-06-30 DIAGNOSIS — C3491 Malignant neoplasm of unspecified part of right bronchus or lung: Secondary | ICD-10-CM

## 2023-07-02 ENCOUNTER — Other Ambulatory Visit: Payer: Self-pay | Admitting: Oncology

## 2023-07-02 DIAGNOSIS — C3491 Malignant neoplasm of unspecified part of right bronchus or lung: Secondary | ICD-10-CM

## 2023-07-03 ENCOUNTER — Ambulatory Visit (HOSPITAL_BASED_OUTPATIENT_CLINIC_OR_DEPARTMENT_OTHER)
Admission: RE | Admit: 2023-07-03 | Discharge: 2023-07-03 | Disposition: A | Discharge status: Home / Self Care | Payer: Medicare Other | Source: Ambulatory Visit | Attending: Hematology and Oncology | Admitting: Hematology and Oncology

## 2023-07-03 ENCOUNTER — Inpatient Hospital Stay: Payer: Medicare Other | Attending: Oncology

## 2023-07-03 DIAGNOSIS — Z85118 Personal history of other malignant neoplasm of bronchus and lung: Secondary | ICD-10-CM | POA: Diagnosis present

## 2023-07-03 DIAGNOSIS — Z853 Personal history of malignant neoplasm of breast: Secondary | ICD-10-CM | POA: Diagnosis not present

## 2023-07-03 DIAGNOSIS — C3491 Malignant neoplasm of unspecified part of right bronchus or lung: Secondary | ICD-10-CM | POA: Diagnosis not present

## 2023-07-03 LAB — CBC WITH DIFFERENTIAL (CANCER CENTER ONLY)
Abs Immature Granulocytes: 0.02 10*3/uL (ref 0.00–0.07)
Basophils Absolute: 0 10*3/uL (ref 0.0–0.1)
Basophils Relative: 0 %
Eosinophils Absolute: 0.3 10*3/uL (ref 0.0–0.5)
Eosinophils Relative: 4 %
HCT: 36.9 % (ref 36.0–46.0)
Hemoglobin: 12.6 g/dL (ref 12.0–15.0)
Immature Granulocytes: 0 %
Lymphocytes Relative: 21 %
Lymphs Abs: 1.6 10*3/uL (ref 0.7–4.0)
MCH: 28.4 pg (ref 26.0–34.0)
MCHC: 34.1 g/dL (ref 30.0–36.0)
MCV: 83.1 fL (ref 80.0–100.0)
Monocytes Absolute: 0.7 10*3/uL (ref 0.1–1.0)
Monocytes Relative: 9 %
Neutro Abs: 5 10*3/uL (ref 1.7–7.7)
Neutrophils Relative %: 66 %
Platelet Count: 232 10*3/uL (ref 150–400)
RBC: 4.44 MIL/uL (ref 3.87–5.11)
RDW: 13 % (ref 11.5–15.5)
WBC Count: 7.6 10*3/uL (ref 4.0–10.5)
nRBC: 0 % (ref 0.0–0.2)
nRBC: 0 /100{WBCs}

## 2023-07-03 LAB — CMP (CANCER CENTER ONLY)
ALT: 16 U/L (ref 0–44)
AST: 25 U/L (ref 15–41)
Albumin: 4 g/dL (ref 3.5–5.0)
Alkaline Phosphatase: 119 U/L (ref 38–126)
Anion gap: 9 (ref 5–15)
BUN: 16 mg/dL (ref 8–23)
CO2: 26 mmol/L (ref 22–32)
Calcium: 9.2 mg/dL (ref 8.9–10.3)
Chloride: 105 mmol/L (ref 98–111)
Creatinine: 0.81 mg/dL (ref 0.44–1.00)
GFR, Estimated: >60 mL/min (ref >60)
Glucose, Bld: 97 mg/dL (ref 70–99)
Potassium: 4.1 mmol/L (ref 3.5–5.1)
Sodium: 140 mmol/L (ref 135–145)
Total Bilirubin: 0.4 mg/dL (ref 0.0–1.2)
Total Protein: 7.1 g/dL (ref 6.5–8.1)

## 2023-07-03 LAB — CEA (ACCESS): CEA (CHCC): 2.43 ng/mL (ref 0.00–5.00)

## 2023-07-03 MED ORDER — IOHEXOL 300 MG/ML  SOLN
75.0000 mL | Freq: Once | INTRAMUSCULAR | Status: AC | PRN
  Administered 2023-07-03: 75 mL via INTRAVENOUS

## 2023-07-15 ENCOUNTER — Telehealth: Payer: Self-pay

## 2023-07-15 NOTE — Telephone Encounter (Signed)
Called patient and notified her of the message

## 2023-07-15 NOTE — Telephone Encounter (Signed)
-----   Message from Dellia Beckwith sent at 07/15/2023 10:20 AM EST ----- Regarding: call Tell her CT looks good, just a lot of scar tissue where she had the radiation

## 2023-07-16 ENCOUNTER — Inpatient Hospital Stay: Payer: Medicare Other | Admitting: Oncology

## 2023-07-21 ENCOUNTER — Telehealth: Payer: Self-pay | Admitting: Oncology

## 2023-07-21 NOTE — Telephone Encounter (Signed)
 07/21/23 Patient rescheduled appt-other commitment.

## 2023-07-23 ENCOUNTER — Inpatient Hospital Stay: Payer: Medicare Other | Admitting: Oncology

## 2023-07-29 ENCOUNTER — Ambulatory Visit: Payer: Medicare Other | Admitting: Oncology

## 2023-08-01 ENCOUNTER — Encounter: Payer: Self-pay | Admitting: Oncology

## 2023-08-01 ENCOUNTER — Inpatient Hospital Stay: Payer: Medicare Other | Attending: Oncology | Admitting: Oncology

## 2023-08-01 ENCOUNTER — Other Ambulatory Visit: Payer: Self-pay | Admitting: Oncology

## 2023-08-01 VITALS — BP 159/69 | HR 62 | Temp 97.8°F | Resp 18 | Ht 67.5 in | Wt 180.0 lb

## 2023-08-01 DIAGNOSIS — Z9221 Personal history of antineoplastic chemotherapy: Secondary | ICD-10-CM | POA: Diagnosis not present

## 2023-08-01 DIAGNOSIS — C3491 Malignant neoplasm of unspecified part of right bronchus or lung: Secondary | ICD-10-CM

## 2023-08-01 DIAGNOSIS — Z853 Personal history of malignant neoplasm of breast: Secondary | ICD-10-CM | POA: Insufficient documentation

## 2023-08-01 DIAGNOSIS — Z8041 Family history of malignant neoplasm of ovary: Secondary | ICD-10-CM | POA: Insufficient documentation

## 2023-08-01 DIAGNOSIS — Z923 Personal history of irradiation: Secondary | ICD-10-CM | POA: Diagnosis not present

## 2023-08-01 DIAGNOSIS — C342 Malignant neoplasm of middle lobe, bronchus or lung: Secondary | ICD-10-CM | POA: Insufficient documentation

## 2023-08-01 DIAGNOSIS — Z801 Family history of malignant neoplasm of trachea, bronchus and lung: Secondary | ICD-10-CM | POA: Insufficient documentation

## 2023-08-01 DIAGNOSIS — R200 Anesthesia of skin: Secondary | ICD-10-CM | POA: Diagnosis not present

## 2023-08-01 DIAGNOSIS — R918 Other nonspecific abnormal finding of lung field: Secondary | ICD-10-CM

## 2023-08-01 DIAGNOSIS — N644 Mastodynia: Secondary | ICD-10-CM | POA: Insufficient documentation

## 2023-08-01 DIAGNOSIS — L299 Pruritus, unspecified: Secondary | ICD-10-CM | POA: Insufficient documentation

## 2023-08-01 DIAGNOSIS — Z90710 Acquired absence of both cervix and uterus: Secondary | ICD-10-CM | POA: Insufficient documentation

## 2023-08-01 NOTE — Progress Notes (Signed)
 [facility]  [address] [place],  [place]  [address] [phone]  Clinic Day: 08/01/2023  Referring physician: Gus Height, PA   CHIEF COMPLAINT:  CC: Stage IA2 squamous cell carcinoma of the lung  Current Treatment: Observation after stereotactic radiation  HISTORY OF PRESENT ILLNESS:  Dana [name] is a 63 y.o. female with a history of stage I hormone receptor positive right breast cancer diagnosed in August 1999.  This was a 1.1 cm invasive ductal carcinoma, treated with lumpectomy, CMF chemotherapy, and radiation.  She did not tolerate tamoxifen, but has never had evidence of recurrence.  We began seeing her in August 2004, when she moved to the area.  She had been on yearly follow-up, but was lost to follow-up from April 2014 to April 2016.Marland Kitchen  CT chest, abdomen and pelvis in April 2016 did not reveal any evidence of malignancy, but there was a 6 mm nodule in the posterior right upper lobe on the CT chest.  CT chest and July 2016 revealed resolution of the 6 mm groundglass nodule in the right upper lobe. Due to her personal history of breast cancer diagnosed at age 34, we have recommended genetic testing with the [facility] [place] Hereditary Cancer Panel testing in July 2016.  This did not reveal any clinically significant mutations or variants of uncertain significance.  She underwent total abdominal hysterectomy for dysfunctional uterine bleeding, but her ovaries are intact.  She also has atrial fibrillation, history of pneumonia, mild to moderate tricuspid regurgitation, and history of prior stroke in 2014.  She had a squamous cell carcinoma removed from her right shoulder, and a squamous cell carcinoma in situ removed from the left anterior chest. She has a history of peptic ulcer disease.  She has had some anxiety and depression.  She undergoes basically annual CT chest for lung cancer screening due to her 35-pack-year history of smoking.  She quit smoking about 10 years  ago.   She developed a newly diagnosed lung cancer in January 2024. The lung cancer screening CT scan on January 8 revealed a new large irregular subpleural solid pulmonary nodule of the right middle lobe measuring 15.9 mm in mean diameter, for a lung RADS 4B, suspicious.  PET scan revealed a hypermetabolic right upper lobe pulmonary nodule most consistent with bronchogenic carcinoma. There was no evidence of metastatic adenopathy or distant metastatic disease.  She underwent bronchoscopy and biopsies with Dr. Tonia Brooms.  Cytology revealed non-small cell lung cancer with negative lymph node.   MRI of the brain did not reveal any evidence of intracranial metastasis.  Pulmonary function tests revealed a moderate degree of COPD with an FEV1 of 2.09 L, 75% of predicted.  She therefore had a clinical stage I non-small cell lung cancer.  We discussed the options of surgical resection versus stereotactic radiation and she chose the latter. She was treated by Dr. Mitzi Hansen at [facility] and tolerated this well.  She completed stereotactic radiation in April of 2024.  CT chest in July revealed interval development of peripheral groundglass and airspace consolidation within the anterior basal right upper lobe and right middle lobe with a round ed dislike area of architectural distortion along the major fissure measuring 4.4 x 2.9 cm favored to represent postradiation changes.  The underlying subpleural nodule was obscured but decreased in size from 1.4 x 1.3 cm to 1.1 x 0.9 cm.  A borderline enlarged right lymph node interval increase in the right paratracheal and subcarinal lymph nodes was felt  to possibly be reactive.  Attention on follow-up was recommended   Oncology History  Malignant neoplasm of central portion of breast in female, estrogen receptor negative (HCC)  03/25/2016 Initial Diagnosis   Malignant neoplasm of central portion of breast in female, estrogen receptor negative (HCC)   Bronchogenic lung cancer,  right (HCC)  07/26/2022 Initial Diagnosis   Bronchogenic lung cancer, right (HCC)   08/09/2022 Cancer Staging   Staging form: Lung, AJCC 8th Edition - Clinical stage from 08/09/2022: Stage IA2 (cT1b, cN0, cM0) - Signed by Dellia Beckwith, MD on 08/14/2022 Histopathologic type: Squamous cell carcinoma, NOS Stage prefix: Initial diagnosis Laterality: Right Tumor size (mm): 15.9 Lymph-vascular invasion (LVI): LVI not present (absent)/not identified Diagnostic confirmation: Positive histology Specimen type: Bronchial Biopsy Staged by: Managing physician Type of lung cancer: Resectable non-small cell lung cancer in inoperable patients treated with radiotherapy ECOG performance status: Grade 0 Symptoms: Absent Stage used in treatment planning: Yes National guidelines used in treatment planning: Yes Type of national guideline used in treatment planning: NCCN     INTERVAL HISTORY:  Dana [name] is here today for repeat clinical assessment for her stage IA2 squamous cell carcinoma of the lung found on lung cancer screening. Patient states that she feels ok and complains of constant headaches and intermittent pain of the right breast/arm/lung. She also complains of itching of her upper back and under right arm. She also informed me that she has been experiencing numbness from her sternum and right breast. She is scheduled for a screening mammogram and I will change this to diagnostic bilateral mammogram. She had a CT chest done on 07/14/2023 which revealed stable radiation changes involving the right middle lobe with extensive radiation fibrosis with no findings suspicious for residual or recurrent tumor. Stable borderline mediastinal and hilar lymph nodes with no new or progressive findings and no findings for metastatic disease involving the chest or upper abdomen was also found along with a stable moderate to large hiatal hernia. I will see her back in 3 months with CBC, CMP, and CEA. She denies signs  of infection such as sore throat, sinus drainage, cough, or urinary symptoms.  She denies fevers or recurrent chills. She denies nausea, vomiting, chest pain, dyspnea or cough. Her appetite is good and her weight has increased 8 pounds over last 3 months . She is accompanied by her husband at today's visit.    REVIEW OF SYSTEMS:  Review of Systems  Constitutional:  Positive for fatigue. Negative for appetite change, chills, diaphoresis, fever and unexpected weight change.  HENT:  Negative.  Negative for hearing loss, lump/mass, mouth sores, nosebleeds, sore throat, tinnitus, trouble swallowing and voice change.   Eyes: Negative.  Negative for eye problems and icterus.  Respiratory: Negative.  Negative for chest tightness, cough, hemoptysis, shortness of breath and wheezing.   Cardiovascular:  Positive for chest pain (intermittent right breast/arm/lung). Negative for leg swelling and palpitations.  Gastrointestinal: Negative.  Negative for abdominal distention, abdominal pain, blood in stool, constipation, diarrhea, nausea, rectal pain and vomiting.  Endocrine: Negative.   Genitourinary: Negative.  Negative for bladder incontinence, difficulty urinating, dyspareunia, dysuria, frequency, hematuria, menstrual problem, nocturia, pelvic pain, vaginal bleeding and vaginal discharge.   Musculoskeletal:  Positive for arthralgias and back pain (mid back to anterior chest). Negative for flank pain, gait problem, myalgias, neck pain and neck stiffness.  Skin:  Positive for itching (breast itching). Negative for rash and wound.  Neurological:  Positive for numbness (sternum and right breast). Negative for  dizziness, extremity weakness, gait problem, headaches, light-headedness, seizures and speech difficulty.  Hematological: Negative.  Negative for adenopathy. Does not bruise/bleed easily.  Psychiatric/Behavioral:  Positive for depression and sleep disturbance. Negative for confusion, decreased concentration and  suicidal ideas. The patient is nervous/anxious.     VITALS:  Blood pressure (!) 159/69, pulse 62, temperature 97.8 F (36.6 C), temperature source Oral, resp. rate 18, height 5' 7.5" (1.715 m), weight 180 lb (81.6 kg), SpO2 98%.  Wt Readings from Last 3 Encounters:  08/01/23 180 lb (81.6 kg)  04/21/23 172 lb 9.6 oz (78.3 kg)  03/27/23 171 lb (77.6 kg)    Body mass index is 27.78 kg/m.  Performance status (ECOG): 1 - Symptomatic but completely ambulatory  PHYSICAL EXAM:  Physical Exam Vitals and nursing note reviewed. Exam conducted with a chaperone present.  Constitutional:      General: She is not in acute distress.    Appearance: Normal appearance. She is not ill-appearing, toxic-appearing or diaphoretic.  HENT:     Head: Normocephalic and atraumatic.     Right Ear: Tympanic membrane, ear canal and external ear normal. There is no impacted cerumen.     Left Ear: Tympanic membrane, ear canal and external ear normal. There is no impacted cerumen.     Nose: Nose normal. No congestion or rhinorrhea.     Mouth/Throat:     Mouth: Mucous membranes are moist.     Pharynx: Oropharynx is clear. No oropharyngeal exudate or posterior oropharyngeal erythema.  Eyes:     General: No scleral icterus.       Right eye: No discharge.        Left eye: No discharge.     Extraocular Movements: Extraocular movements intact.     Conjunctiva/sclera: Conjunctivae normal.     Pupils: Pupils are equal, round, and reactive to light.  Neck:     Vascular: No carotid bruit.  Cardiovascular:     Rate and Rhythm: Normal rate and regular rhythm.     Pulses: Normal pulses.     Heart sounds: Normal heart sounds. No murmur heard.    No friction rub. No gallop.  Pulmonary:     Effort: Pulmonary effort is normal. No respiratory distress.     Breath sounds: Normal breath sounds. No stridor. No wheezing, rhonchi or rales.  Chest:     Chest wall: No mass or tenderness.  Breasts:    Right: Normal. No inverted  nipple, mass, nipple discharge or skin change.     Left: Normal. No inverted nipple, mass, nipple discharge or skin change.     Comments: Large scar in the posterior right shoulder Well healed right axillary scar Scar in the medial right breast Scar in the left upper chest from her port No masses in either breast Abdominal:     General: Bowel sounds are normal. There is no distension.     Palpations: Abdomen is soft. There is no hepatomegaly, splenomegaly or mass.     Tenderness: There is no abdominal tenderness. There is no right CVA tenderness, left CVA tenderness, guarding or rebound.     Hernia: No hernia is present.  Musculoskeletal:        General: No swelling, tenderness, deformity or signs of injury. Normal range of motion.     Cervical back: Normal range of motion and neck supple. No rigidity, tenderness or bony tenderness.     Right lower leg: No edema.     Left lower leg: No edema.  Lymphadenopathy:  Cervical: No cervical adenopathy.     Upper Body:     Right upper body: No supraclavicular or axillary adenopathy.     Left upper body: No supraclavicular or axillary adenopathy.     Lower Body: No right inguinal adenopathy. No left inguinal adenopathy.  Skin:    General: Skin is warm and dry.     Coloration: Skin is not jaundiced or pale.     Findings: No bruising, erythema, lesion or rash.  Neurological:     General: No focal deficit present.     Mental Status: She is alert and oriented to person, place, and time. Mental status is at baseline.     Cranial Nerves: No cranial nerve deficit.     Sensory: No sensory deficit.     Motor: No weakness.     Coordination: Coordination normal.     Gait: Gait normal.     Deep Tendon Reflexes: Reflexes normal.  Psychiatric:        Mood and Affect: Mood normal.        Behavior: Behavior normal.        Thought Content: Thought content normal.        Judgment: Judgment normal.    LABS:      Latest Ref Rng & Units 07/03/2023     2:17 PM 12/16/2022   12:00 AM 06/14/2022    8:36 AM  CBC  WBC 4.0 - 10.5 K/uL 7.6  7.1     7.8   Hemoglobin 12.0 - 15.0 g/dL 16.1  09.6     04.5   Hematocrit 36.0 - 46.0 % 36.9  37     36.9   Platelets 150 - 400 K/uL 232  204     213      This result is from an external source.      Latest Ref Rng & Units 07/03/2023    2:17 PM 03/13/2023   12:00 AM 12/16/2022   12:00 AM  CMP  Glucose 70 - 99 mg/dL 97     BUN 8 - 23 mg/dL 16  14     13       Creatinine 0.44 - 1.00 mg/dL 4.09  0.7     0.6      Sodium 135 - 145 mmol/L 140   138      Potassium 3.5 - 5.1 mmol/L 4.1   3.7      Chloride 98 - 111 mmol/L 105   105      CO2 22 - 32 mmol/L 26   28      Calcium 8.9 - 10.3 mg/dL 9.2   9.0      Total Protein 6.5 - 8.1 g/dL 7.1     Total Bilirubin 0.0 - 1.2 mg/dL 0.4     Alkaline Phos 38 - 126 U/L 119   111      AST 15 - 41 U/L 25   34      ALT 0 - 44 U/L 16   20         This result is from an external source.   Lab Results  Component Value Date   CEA1 3.2 03/27/2023   CEA 2.43 07/03/2023   /  CEA  Date Value Ref Range Status  03/27/2023 3.2 0.0 - 4.7 ng/mL Final    Comment:    (NOTE)  Nonsmokers          <3.9                             Smokers             <5.6 [employer] Electrochemiluminescence Immunoassay (ECLIA) Values obtained with different assay methods or kits cannot be used interchangeably.  Results cannot be interpreted as absolute evidence of the presence or absence of malignant disease. Performed At: [facility] [address] [place], [place] [id number] Jolene Schimke MD VZ:[id number]    CEA [facility])  Date Value Ref Range Status  07/03/2023 2.43 0.00 - 5.00 ng/mL Final    Comment:    (NOTE) This test was performed using Beckman Coulter's paramagnetic chemiluminescent immunoassay. Values obtained from different assay methods cannot be used interchangeably. Please note that up to 8% of patients who smoke may see values  5.1-10.0 ng/ml and 1% of patients who smoke may see CEA levels >10.0 ng/ml. Performed at [facility], [address], [place], [place] [address]    No results found for: "PSA1" No results found for: "[id number]" No results found for: "CAN125"  No results found for: "TOTALPROTELP", "ALBUMINELP", "A1GS", "A2GS", "BETS", "BETA2SER", "GAMS", "MSPIKE", "SPEI" Lab Results  Component Value Date   TIBC 467 (H) 04/06/2020   FERRITIN 12 04/06/2020   IRONPCTSAT 10 (L) 04/06/2020   Lab Results  Component Value Date   LDH 163 04/06/2020    STUDIES:   EXAM: 07/14/2023 CT CHEST WITH CONTRAST IMPRESSION: 1. Stable radiation changes involving the right middle lobe with extensive radiation fibrosis. No findings suspicious for residual or recurrent tumor. 2. Stable borderline mediastinal and hilar lymph nodes. No new or progressive findings. 3. No findings for metastatic disease involving the chest or upper abdomen. 4. Stable moderate to large hiatal hernia.   HISTORY:   Past Medical History:  Diagnosis Date   Asthma    Atypical chest pain 03/25/2016   Breast cancer (HCC)    COPD (chronic obstructive pulmonary disease) (HCC)    Dizziness 08/18/2017   Hx of adenomatous polyp of colon 08/09/2009   Hyperthyroidism 01/04/2016   Late effects of CVA (cerebrovascular accident) 03/25/2016   Lymphedema of right upper extremity 08/09/2020   Malignant neoplasm of central portion of breast in female, estrogen receptor negative (HCC) 03/25/2016   Obstructive sleep apnea syndrome 03/25/2016   Paroxysmal atrial fibrillation (HCC) 03/25/2016   Precordial chest pain 08/16/2020    Past Surgical History:  Procedure Laterality Date   ABDOMINAL HYSTERECTOMY     APPENDECTOMY     BREAST BIOPSY     BRONCHIAL BIOPSY  07/02/2022   Procedure: BRONCHIAL BIOPSIES;  Surgeon: Josephine Igo, DO;  Location: [facility];  Service: Pulmonary;;   BRONCHIAL NEEDLE ASPIRATION BIOPSY  07/02/2022    Procedure: BRONCHIAL NEEDLE ASPIRATION BIOPSIES;  Surgeon: Josephine Igo, DO;  Location: [facility];  Service: Pulmonary;;   FINE NEEDLE ASPIRATION  07/02/2022   Procedure: FINE NEEDLE ASPIRATION (FNA) LINEAR;  Surgeon: Josephine Igo, DO;  Location: [facility];  Service: Pulmonary;;   lymph node removal     PORTACATH PLACEMENT     portacath removal     TONSILLECTOMY     VIDEO BRONCHOSCOPY WITH ENDOBRONCHIAL ULTRASOUND  07/02/2022   Procedure: VIDEO BRONCHOSCOPY WITH ENDOBRONCHIAL ULTRASOUND;  Surgeon: Josephine Igo, DO;  Location: [facility];  Service: Pulmonary;;    Family History  Problem Relation Age of  Onset   Hypertension Maternal Grandmother    Lung cancer Maternal Uncle    Hypertension Mother    Ovarian cancer Paternal Aunt    Ovarian cancer Paternal Grandfather     Social History:  reports that she quit smoking about 10 years ago. Her smoking use included cigarettes. She has never used smokeless tobacco. She reports current alcohol use. She reports that she does not use drugs.The patient is accompanied by her husband today.  Allergies: No Known Allergies  Current Medications: Current Outpatient Medications  Medication Sig Dispense Refill   apixaban (ELIQUIS) 5 MG TABS tablet Take 1 tablet (5 mg total) by mouth 2 (two) times daily. 180 tablet 3   atenolol (TENORMIN) 50 MG tablet Take 50 mg by mouth daily.     atorvastatin (LIPITOR) 80 MG tablet TAKE ONE TABLET BY MOUTH DAILY 90 tablet 3   diltiazem (CARDIZEM) 30 MG tablet Take 1 tablet (30 mg total) by mouth every 6 (six) hours as needed (palpitations). 30 tablet 1   ibuprofen (ADVIL) 200 MG tablet Take 400 mg by mouth every 6 (six) hours as needed for headache.     pantoprazole (PROTONIX) 40 MG tablet Take 40 mg by mouth daily.     sertraline (ZOLOFT) 100 MG tablet Take 150 mg by mouth daily.      torsemide (DEMADEX) 10 MG tablet Take 1 tablet (10 mg total) by mouth daily. 90 tablet 2   traMADol (ULTRAM) 50 MG  tablet Take 1 tablet (50 mg total) by mouth every 6 (six) hours as needed for moderate pain. 60 tablet 3   TRELEGY ELLIPTA 100-62.5-25 MCG/ACT AEPB Take 1 puff by mouth daily.     No current facility-administered medications for this visit.   ASSESSMENT & PLAN:  Assessment: 1.  Remote history of hormone receptor positive right breast cancer treated with lumpectomy and adjuvant chemotherapy radiation therapy.  She did not tolerate tamoxifen.  Genetic testing was negative.  She is due for bilateral mammogram so I will get that scheduled, but schedule it as a diagnostic since she is having pain in the right breast and swelling.  2.  Stage IA2 non-small cell lung cancer.  The postradiation changes in the right upper lobe nearly resolved.  There is a minimal increase in a paratracheal lymph node, of uncertain significance.  This is unlikely to be PET sensitive, so we will plan to repeat a CT chest in 4 months.  3. Symptoms of the right chest and right breast which are likely related to her recent radiation of the right middle lobe lesion. This includes pain, itching, and numbness. I have mainly reassured her.  However, I think this warrants a diagnostic mammogram rather than screening and I will get that scheduled.  Plan:  She is scheduled for a screening mammogram and I will change this to diagnostic bilateral mammogram since she is having pain. She had a CT chest done on 07/14/2023 which revealed stable radiation changes involving the right middle lobe with extensive radiation fibrosis with no findings suspicious for residual or recurrent tumor. Stable borderline mediastinal and hilar lymph nodes with no new or progressive findings and no findings for metastatic disease involving the chest or upper abdomen was also found along with a stable moderate to large hiatal hernia. I will see her back in 3 months with CBC, CMP, and CEA. The patient and her husband understand the plans discussed today and is in  agreement with them.  She knows to contact our  office if she develops concerns prior to her next appointment.    I provided 11 minutes of face-to-face time during this encounter and > 50% was spent counseling as documented under my assessment and plan.   Dellia Beckwith, MD [facility] - A DEPT OF [facility] [address] [place] [address] Dept: [phone] Dept Fax: [phone]   No orders of the defined types were placed in this encounter.   I,Jasmine M Lassiter,acting as a scribe for Dellia Beckwith, MD.,have documented all relevant documentation on the behalf of Dellia Beckwith, MD,as directed by  Dellia Beckwith, MD while in the presence of Dellia Beckwith, MD.

## 2023-08-04 ENCOUNTER — Telehealth: Payer: Self-pay | Admitting: Oncology

## 2023-08-04 NOTE — Telephone Encounter (Signed)
 08/04/23 Spoke with patient and confirmed next appts.

## 2023-08-04 NOTE — Telephone Encounter (Signed)
 08/04/23 Spoke with patient and confirmed next appt.

## 2023-09-05 ENCOUNTER — Telehealth: Payer: Self-pay

## 2023-09-05 ENCOUNTER — Other Ambulatory Visit: Payer: Self-pay | Admitting: Oncology

## 2023-09-05 DIAGNOSIS — C3491 Malignant neoplasm of unspecified part of right bronchus or lung: Secondary | ICD-10-CM

## 2023-09-05 DIAGNOSIS — R0789 Other chest pain: Secondary | ICD-10-CM

## 2023-09-05 NOTE — Telephone Encounter (Signed)
 Dr Gilman Buttner: she really needs a CTA, which we can't do here. Can we get that scheduled at OP at [facility]? If not, would need to go to ER   Dr Gilman Buttner: called pt and describes pain worse with inspiration, has been going on x 2 weeks, not 6, she was confused. She says it is a 9/10 but better when she lays down. I told her the only way we can get a CTA is thru the ER. If she wants to wait until Monday, since she is already on Eliquis, so let's schedule for Monday and will need to call result to [facility]. If she gets worse over the weekend, will go to the ER

## 2023-09-05 NOTE — Telephone Encounter (Signed)
 Pt LVM on nurse line reporting, "I'm having a lot of pain where radiation was for the last 6 weeks. It's like 9.5 to 10, on scale of 1-10. Pain is worse when bending over. Pain is from bottom of ribs to right breast. It feels kinds like a blood clot I had before, but I don't think it is because it has been hurting for so long. I want to know if Dr Gilman Buttner thinks I need a cxray? If she doesn't think it is anything to worry about, then I wont worry about it. I called my primary care, but they are closed".  @[phone]  - I called pt back to get little more information. Pt reports her baseline SOB is a little worse. "I also can't take a full breath in when I use my inhaler". She is on Trelegy inhaler daily, & PRN albuterol inhaler. Afebrile. Occasional non-productive cough. Doesn't use oxygen. I'd like to have a cxray done before having to go to ER.

## 2023-09-08 ENCOUNTER — Telehealth: Payer: Self-pay

## 2023-09-08 LAB — COMPREHENSIVE METABOLIC PANEL WITH GFR: EGFR: 82

## 2023-09-08 NOTE — Telephone Encounter (Signed)
Detailed message left on home and cell phone.

## 2023-09-08 NOTE — Telephone Encounter (Signed)
-----   Message from Dana [name] sent at 09/08/2023  2:22 PM EDT ----- Please let her know no evidence of pulmonary embolism on CTA chest today, thanks

## 2023-09-08 NOTE — Telephone Encounter (Signed)
 Dr Almer Jacobson: tell her scan is all clear, no blood clots. I see scarring of the right middle lobe from her radiation and a small lymph node which is normal size and not changed from prior scan 6 months ago.

## 2023-09-12 ENCOUNTER — Encounter: Payer: Self-pay | Admitting: Oncology

## 2023-09-24 LAB — HM MAMMOGRAPHY

## 2023-10-01 ENCOUNTER — Other Ambulatory Visit: Payer: Self-pay | Admitting: Oncology

## 2023-10-01 DIAGNOSIS — R0789 Other chest pain: Secondary | ICD-10-CM

## 2023-10-03 ENCOUNTER — Encounter: Payer: Self-pay | Admitting: Oncology

## 2023-10-28 NOTE — Progress Notes (Signed)
 [facility]  [address] [place],  [place]  [address] [phone]  Clinic Day: 10/31/23  Referring physician: Vicci Odor, PA  CHIEF COMPLAINT:  CC: Stage IA2 squamous cell carcinoma of the lung  Current Treatment: Observation after stereotactic radiation  HISTORY OF PRESENT ILLNESS:  Dana [name] is a 63 y.o. female with a history of stage I hormone receptor positive right breast cancer diagnosed in August 1999.  This was a 1.1 cm invasive ductal carcinoma, treated with lumpectomy, CMF chemotherapy, and radiation.  She did not tolerate tamoxifen, but has never had evidence of recurrence.  We began seeing her in August 2004, when she moved to the area.  She had been on yearly follow-up, but was lost to follow-up from April 2014 to April 2016.[name]  CT chest, abdomen and pelvis in April 2016 did not reveal any evidence of malignancy, but there was a 6 mm nodule in the posterior right upper lobe on the CT chest.  CT chest and July 2016 revealed resolution of the 6 mm groundglass nodule in the right upper lobe. Due to her personal history of breast cancer diagnosed at age 42, we have recommended genetic testing with the [facility] Hereditary Cancer Panel testing in July 2016.  This did not reveal any clinically significant mutations or variants of uncertain significance.  She underwent total abdominal hysterectomy for dysfunctional uterine bleeding, but her ovaries are intact.  She also has atrial fibrillation, history of pneumonia, mild to moderate tricuspid regurgitation, and history of prior stroke in 2014.  She had a squamous cell carcinoma removed from her right shoulder, and a squamous cell carcinoma in situ removed from the left anterior chest. She has a history of peptic ulcer disease.  She has had some anxiety and depression.  She undergoes basically annual CT chest for lung cancer screening due to her 35-pack-year history of smoking.  She quit smoking about 10 years  ago.   She developed a newly diagnosed lung cancer in January 2024. The lung cancer screening CT scan on January 8 revealed a new large irregular subpleural solid pulmonary nodule of the right middle lobe measuring 15.9 mm in mean diameter, for a lung RADS 4B, suspicious.  PET scan revealed a hypermetabolic right upper lobe pulmonary nodule most consistent with bronchogenic carcinoma. There was no evidence of metastatic adenopathy or distant metastatic disease.  She underwent bronchoscopy and biopsies with Dr. Brenna.  Cytology revealed non-small cell lung cancer with negative lymph node.   MRI of the brain did not reveal any evidence of intracranial metastasis.  Pulmonary function tests revealed a moderate degree of COPD with an FEV1 of 2.09 L, 75% of predicted.  She therefore had a clinical stage I non-small cell lung cancer.  We discussed the options of surgical resection versus stereotactic radiation and she chose the latter. She was treated by Dr. Dewey at [facility] and tolerated this well.  She completed stereotactic radiation in April of 2024.  CT chest in July revealed interval development of peripheral groundglass and airspace consolidation within the anterior basal right upper lobe and right middle lobe with a round ed dislike area of architectural distortion along the major fissure measuring 4.4 x 2.9 cm favored to represent postradiation changes.  The underlying subpleural nodule was obscured but decreased in size from 1.4 x 1.3 cm to 1.1 x 0.9 cm.  A borderline enlarged right lymph node interval increase in the right paratracheal and subcarinal lymph nodes was felt to  possibly be reactive.  Attention on follow-up was recommended   Oncology History  Malignant neoplasm of central portion of breast in female, estrogen receptor negative (HCC)  03/25/2016 Initial Diagnosis   Malignant neoplasm of central portion of breast in female, estrogen receptor negative (HCC)   Bronchogenic lung cancer,  right (HCC)  07/26/2022 Initial Diagnosis   Bronchogenic lung cancer, right (HCC)   08/09/2022 Cancer Staging   Staging form: Lung, AJCC 8th Edition - Clinical stage from 08/09/2022: Stage IA2 (cT1b, cN0, cM0) - Signed by Cornelius Wanda DEL, MD on 08/14/2022 Histopathologic type: Squamous cell carcinoma, NOS Stage prefix: Initial diagnosis Laterality: Right Tumor size (mm): 15.9 Lymph-vascular invasion (LVI): LVI not present (absent)/not identified Diagnostic confirmation: Positive histology Specimen type: Bronchial Biopsy Staged by: Managing physician Type of lung cancer: Resectable non-small cell lung cancer in inoperable patients treated with radiotherapy ECOG performance status: Grade 0 Symptoms: Absent Stage used in treatment planning: Yes National guidelines used in treatment planning: Yes Type of national guideline used in treatment planning: NCCN     INTERVAL HISTORY:  Dana [name] is here today for repeat clinical assessment for her stage IA2 squamous cell carcinoma of the lung found on lung cancer screening, and remote history of breast cancer. She had a clear mammogram on 09/24/23 and US  just revealed dense fibroglandular tissue at the area of concern.  Patient states that she feels fair but has several complaints. She describes pain of the mid to right chest associated with paresthesias and gets partial relief with tramadol  50 mg, 2 at bedtime. She requests Hydoxyzine to use with this as it has helped her in the past. The pain is worse when she takes a deep breath and is dull, occurring for the last week.  She denies cough or sputum production, no fever, wheezing or chills. Her hemoglobin has dropped from 12.6 to 10.7 since February and she admits to black stool with decreased caliber. She does not take iron or pepto bismol but does take Hair, skin and Nails. She denies abdominal pain but does have some mild epigastric tenderness.  She has been under increased stress with her elderly  mother, and I think has anxiety about her recent lung cancer as well.  Her WBC's and platelets are normal and her MCV is 83.5.  CMP reveals a mildly low potassium of 3.4, non-fasting glucose of 166 and creatinine of 1.09, with the rest normal. I have recommended she increase potassium in her diet and will schedule a repeat CT of the chest.  I will order iron/TIBC, ferritin, B12 and folate levels as well as stool hemoccult x 3. I will have her increase her pantoprazole  to 40 mg BID and refill that.  I will also refill her tramadol  and hydroxyzine  50 mg prn.  I will refer her to Dr. Larene, gastroenterologist for further evaluation.  I will plan to see her back next month with repeat CBC and CMP.  She denies fever, chills, night sweats, or other signs of infection. She denies cardiorespiratory and gastrointestinal issues. She  denies pain. Her appetite is okay and Her weight has decreased 6.5 pounds over last 3 months.  REVIEW OF SYSTEMS:  Review of Systems  Constitutional:  Positive for fatigue. Negative for appetite change, chills, diaphoresis, fever and unexpected weight change.  HENT:  Negative.  Negative for hearing loss, lump/mass, mouth sores, nosebleeds, sore throat, tinnitus, trouble swallowing and voice change.   Eyes: Negative.  Negative for eye problems and icterus.  Respiratory: Negative.  Negative  for chest tightness, cough, hemoptysis, shortness of breath and wheezing.   Cardiovascular:  Positive for chest pain (intermittent right breast/arm/lung). Negative for leg swelling and palpitations.  Gastrointestinal: Negative.  Negative for abdominal distention, abdominal pain, blood in stool, constipation, diarrhea, nausea, rectal pain and vomiting.  Endocrine: Negative.   Genitourinary: Negative.  Negative for bladder incontinence, difficulty urinating, dyspareunia, dysuria, frequency, hematuria, menstrual problem, nocturia, pelvic pain, vaginal bleeding and vaginal discharge.    Musculoskeletal:  Positive for arthralgias and back pain (mid back to anterior chest). Negative for flank pain, gait problem, myalgias, neck pain and neck stiffness.  Skin:  Positive for itching (breast itching). Negative for rash and wound.  Neurological:  Positive for numbness (sternum and right breast). Negative for dizziness, extremity weakness, gait problem, headaches, light-headedness, seizures and speech difficulty.  Hematological: Negative.  Negative for adenopathy. Does not bruise/bleed easily.  Psychiatric/Behavioral:  Positive for depression and sleep disturbance. Negative for confusion, decreased concentration and suicidal ideas. The patient is nervous/anxious.     VITALS:  Blood pressure 128/67, pulse (!) 52, temperature 98.1 F (36.7 C), temperature source Oral, resp. rate 18, height 5' 7.5 (1.715 m), weight 173 lb 9 oz (78.7 kg), SpO2 97%.  Wt Readings from Last 3 Encounters:  10/31/23 173 lb 9 oz (78.7 kg)  08/01/23 180 lb (81.6 kg)  04/21/23 172 lb 9.6 oz (78.3 kg)    Body mass index is 26.78 kg/m.  Performance status (ECOG): 1 - Symptomatic but completely ambulatory  PHYSICAL EXAM:  Physical Exam Vitals and nursing note reviewed. Exam conducted with a chaperone present.  Constitutional:      General: She is not in acute distress.    Appearance: Normal appearance. She is not ill-appearing, toxic-appearing or diaphoretic.  HENT:     Head: Normocephalic and atraumatic.     Right Ear: Tympanic membrane, ear canal and external ear normal. There is no impacted cerumen.     Left Ear: Tympanic membrane, ear canal and external ear normal. There is no impacted cerumen.     Nose: Nose normal. No congestion or rhinorrhea.     Mouth/Throat:     Mouth: Mucous membranes are moist.     Pharynx: Oropharynx is clear. No oropharyngeal exudate or posterior oropharyngeal erythema.   Eyes:     General: No scleral icterus.       Right eye: No discharge.        Left eye: No  discharge.     Extraocular Movements: Extraocular movements intact.     Conjunctiva/sclera: Conjunctivae normal.     Pupils: Pupils are equal, round, and reactive to light.   Neck:     Vascular: No carotid bruit.   Cardiovascular:     Rate and Rhythm: Normal rate and regular rhythm.     Pulses: Normal pulses.     Heart sounds: Normal heart sounds. No murmur heard.    No friction rub. No gallop.  Pulmonary:     Effort: Pulmonary effort is normal. No respiratory distress.     Breath sounds: Normal breath sounds. No stridor. No wheezing, rhonchi or rales.  Chest:     Chest wall: No mass or tenderness.  Breasts:    Right: Normal. No inverted nipple, mass, nipple discharge or skin change.     Left: Normal. No inverted nipple, mass, nipple discharge or skin change.     Comments: Large scar in the posterior right shoulder Well healed right axillary scar Scar in the medial right breast Scar  in the left upper chest from her port No masses in either breast Abdominal:     General: Bowel sounds are normal. There is no distension.     Palpations: Abdomen is soft. There is no hepatomegaly, splenomegaly or mass.     Tenderness: There is no abdominal tenderness. There is no right CVA tenderness, left CVA tenderness, guarding or rebound.     Hernia: No hernia is present.   Musculoskeletal:        General: No swelling, tenderness, deformity or signs of injury. Normal range of motion.     Cervical back: Normal range of motion and neck supple. No rigidity, tenderness or bony tenderness.     Right lower leg: No edema.     Left lower leg: No edema.  Lymphadenopathy:     Cervical: No cervical adenopathy.     Upper Body:     Right upper body: No supraclavicular or axillary adenopathy.     Left upper body: No supraclavicular or axillary adenopathy.     Lower Body: No right inguinal adenopathy. No left inguinal adenopathy.   Skin:    General: Skin is warm and dry.     Coloration: Skin is not  jaundiced or pale.     Findings: No bruising, erythema, lesion or rash.   Neurological:     General: No focal deficit present.     Mental Status: She is alert and oriented to person, place, and time. Mental status is at baseline.     Cranial Nerves: No cranial nerve deficit.     Sensory: No sensory deficit.     Motor: No weakness.     Coordination: Coordination normal.     Gait: Gait normal.     Deep Tendon Reflexes: Reflexes normal.   Psychiatric:        Mood and Affect: Mood normal.        Behavior: Behavior normal.        Thought Content: Thought content normal.        Judgment: Judgment normal.    LABS:      Latest Ref Rng & Units 10/31/2023    3:50 PM 07/03/2023    2:17 PM 12/16/2022   12:00 AM  CBC  WBC 4.0 - 10.5 K/uL 6.9  7.6  7.1      Hemoglobin 12.0 - 15.0 g/dL 89.2  87.3  87.5      Hematocrit 36.0 - 46.0 % 33.0  36.9  37      Platelets 150 - 400 K/uL 226  232  204         This result is from an external source.      Latest Ref Rng & Units 10/31/2023    3:50 PM 07/03/2023    2:17 PM 03/13/2023   12:00 AM  CMP  Glucose 70 - 99 mg/dL 833  97    BUN 8 - 23 mg/dL 19  16  14       Creatinine 0.44 - 1.00 mg/dL 8.90  9.18  0.7      Sodium 135 - 145 mmol/L 139  140    Potassium 3.5 - 5.1 mmol/L 3.4  4.1    Chloride 98 - 111 mmol/L 105  105    CO2 22 - 32 mmol/L 22  26    Calcium  8.9 - 10.3 mg/dL 9.4  9.2    Total Protein 6.5 - 8.1 g/dL 6.5  7.1    Total Bilirubin 0.0 - 1.2 mg/dL 0.4  0.4    Alkaline Phos 38 - 126 U/L 119  119    AST 15 - 41 U/L 24  25    ALT 0 - 44 U/L 14  16       This result is from an external source.   Lab Results  Component Value Date   CEA1 3.2 03/27/2023   CEA 2.52 10/31/2023   /  CEA  Date Value Ref Range Status  03/27/2023 3.2 0.0 - 4.7 ng/mL Final    Comment:    (NOTE)                             Nonsmokers          <3.9                             Smokers             <5.6 [employer] Electrochemiluminescence  Immunoassay (ECLIA) Values obtained with different assay methods or kits cannot be used interchangeably.  Results cannot be interpreted as absolute evidence of the presence or absence of malignant disease. Performed At: [facility] [address] [place], [place] [id number] Jennette Shorter MD Ey:[phone]    CEA ([facility])  Date Value Ref Range Status  10/31/2023 2.52 0.00 - 5.00 ng/mL Final    Comment:    (NOTE) This test was performed using [employer] Coul[employer]'s paramagnetic chemiluminescent immunoassay. Values obtained from different assay methods cannot be used interchangeably. Please note that up to 8% of patients who smoke may see values 5.1-10.0 ng/ml and 1% of patients who smoke may see CEA levels >10.0 ng/ml. Performed at [facility], [address] [place], [place] [address]    No results found for: PSA1 No results found for: CAN199 No results found for: CAN125  No results found for: TOTALPROTELP, ALBUMINELP, A1GS, A2GS, BETS, BETA2SER, GAMS, MSPIKE, SPEI Lab Results  Component Value Date   TIBC 467 (H) 04/06/2020   FERRITIN 12 04/06/2020   IRONPCTSAT 10 (L) 04/06/2020   Lab Results  Component Value Date   LDH 163 04/06/2020    STUDIES:     EXAM: 07/14/2023 CT CHEST WITH CONTRAST IMPRESSION: 1. Stable radiation changes involving the right middle lobe with extensive radiation fibrosis. No findings suspicious for residual or recurrent tumor. 2. Stable borderline mediastinal and hilar lymph nodes. No new or progressive findings. 3. No findings for metastatic disease involving the chest or upper abdomen. 4. Stable moderate to large hiatal hernia.   HISTORY:   Past Medical History:  Diagnosis Date   Asthma    Atypical chest pain 03/25/2016   Breast cancer (HCC)    COPD (chronic obstructive pulmonary disease) (HCC)    Dizziness 08/18/2017   Hx of adenomatous polyp of colon 08/09/2009   Hyperthyroidism  01/04/2016   Late effects of CVA (cerebrovascular accident) 03/25/2016   Lymphedema of right upper extremity 08/09/2020   Malignant neoplasm of central portion of breast in female, estrogen receptor negative (HCC) 03/25/2016   Obstructive sleep apnea syndrome 03/25/2016   Paroxysmal atrial fibrillation (HCC) 03/25/2016   Precordial chest pain 08/16/2020    Past Surgical History:  Procedure Laterality Date   ABDOMINAL HYSTERECTOMY     APPENDECTOMY     BREAST BIOPSY     BRONCHIAL BIOPSY  07/02/2022   Procedure: BRONCHIAL BIOPSIES;  Surgeon: Brenna Adine CROME, DO;  Location: [facility];  Service: Pulmonary;;   BRONCHIAL NEEDLE ASPIRATION BIOPSY  07/02/2022   Procedure: BRONCHIAL NEEDLE ASPIRATION BIOPSIES;  Surgeon: Brenna Adine CROME, DO;  Location: [facility];  Service: Pulmonary;;   FINE NEEDLE ASPIRATION  07/02/2022   Procedure: FINE NEEDLE ASPIRATION (FNA) LINEAR;  Surgeon: Brenna Adine CROME, DO;  Location: [facility];  Service: Pulmonary;;   lymph node removal     PORTACATH PLACEMENT     portacath removal     TONSILLECTOMY     VIDEO BRONCHOSCOPY WITH ENDOBRONCHIAL ULTRASOUND  07/02/2022   Procedure: VIDEO BRONCHOSCOPY WITH ENDOBRONCHIAL ULTRASOUND;  Surgeon: Brenna Adine CROME, DO;  Location: [facility];  Service: Pulmonary;;    Family History  Problem Relation Age of Onset   Hypertension Maternal Grandmother    Lung cancer Maternal Uncle    Hypertension Mother    Ovarian cancer Paternal Aunt    Ovarian cancer Paternal Grandfather     Social History:  reports that she quit smoking about 10 years ago. Her smoking use included cigarettes. She has never used smokeless tobacco. She reports current alcohol use. She reports that she does not use drugs.The patient is accompanied by her husband today.  Allergies: No Known Allergies  Current Medications: Current Outpatient Medications  Medication Sig Dispense Refill   apixaban  (ELIQUIS ) 5 MG TABS tablet Take 1 tablet (5 mg total) by  mouth 2 (two) times daily. 180 tablet 3   atenolol (TENORMIN) 50 MG tablet Take 50 mg by mouth daily.     atorvastatin  (LIPITOR) 80 MG tablet TAKE ONE TABLET BY MOUTH DAILY 90 tablet 1   diltiazem  (CARDIZEM ) 30 MG tablet Take 1 tablet (30 mg total) by mouth every 6 (six) hours as needed (palpitations). 30 tablet 1   hydrOXYzine  (ATARAX ) 25 MG tablet Take 1 tablet (25 mg total) by mouth every 8 (eight) hours as needed for anxiety. 60 tablet 5   ibuprofen (ADVIL) 200 MG tablet Take 400 mg by mouth every 6 (six) hours as needed for headache.     pantoprazole  (PROTONIX ) 40 MG tablet Take 1 tablet (40 mg total) by mouth 2 (two) times daily. 60 tablet 5   sertraline (ZOLOFT) 100 MG tablet Take 150 mg by mouth daily.      torsemide  (DEMADEX ) 10 MG tablet Take 1 tablet (10 mg total) by mouth daily. 90 tablet 2   traMADol  (ULTRAM ) 50 MG tablet Take 1 tablet (50 mg total) by mouth every 6 (six) hours as needed for moderate pain (pain score 4-6). 120 tablet 3   TRELEGY ELLIPTA  100-62.5-25 MCG/ACT AEPB Take 1 puff by mouth daily.     No current facility-administered medications for this visit.   ASSESSMENT & PLAN:  Assessment: 1.  Remote history of hormone receptor positive right breast cancer treated with lumpectomy and adjuvant chemotherapy radiation therapy.  She did not tolerate tamoxifen.  Genetic testing was negative.  She had a clear mammogram on 09/24/23 and US  just revealed dense fibroglandular tissue at the area of concern.  2.  Stage IA2 non-small cell lung cancer.  The postradiation changes in the right upper lobe nearly resolved.  There is a minimal increase in a paratracheal lymph node on her last CT scan, of uncertain significance.   3. Symptoms of the right chest and right breast which are likely related to her recent radiation of the right middle lobe lesion. This includes pain, itching, and numbness. I have mainly reassured her.  However, I think this warrants a repeat chest CT, and I  will  get that scheduled.  4.  New anemia with significant decrease of hemoglobin from 12.6 in February to 10.7 now, and associated with black stools, epigastric tenderness and also decrease in stool caliber.  I will order an anemia evaluation and check stool hemoccults. She will increase the pantoprazole  to BID and have referral to Dr. Larene.  5.  Mild hypokalemia.  I recommended an increase in her diet and will recheck all labs next month.  6.  Anxiety regarding lung cancer and stress with caring for her elderly mother.  I think it is reasonable to order the hydroxyzine  to help her nerves.   Plan: She is here for follow up of her stage IA2 non small cell lung cancer treated with SBRT. She has a remote history of breast cancer. She had a clear mammogram on 09/24/23 and US  just revealed dense fibroglandular tissue at the area of concern. She describes pain of the mid to right chest associated with paresthesias and gets partial relief with tramadol  50 mg, 2 at bedtime. She requests Hydoxyzine to use with this as it has helped her in the past. The pain is worse when she takes a deep breath and is dull, occurring for the last week.  She denies cough or sputum production, no fever, wheezing or chills. Her hemoglobin has dropped from 12.6 to 10.7 since February and she admits to black stool with decreased caliber. She does not take iron or pepto bismol but does take Hair, skin and Nails. She denies abdominal pain but does have some mild epigastric tenderness.  She has been under increased stress with her elderly mother, and I think has anxiety about her recent lung cancer as well.  Her WBC's and platelets are normal and her MCV is 83.5.  CMP reveals a mildly low potassium of 3.4, non-fasting glucose of 166 and creatinine of 1.09, with the rest normal. I have recommended she increase potassium in her diet and will schedule a repeat CT of the chest.  I will order iron/TIBC, ferritin, B12 and folate levels as well  as stool hemoccult x 3. I will have her increase her pantoprazole  to 40 mg BID and refill that.  I will also refill her tramadol  and hydroxyzine  50 mg prn.  I will refer her to Dr. Larene, gastroenterologist for further evaluation.  I will plan to see her back next month with repeat CBC and CMP.  The patient understands the plans discussed today and is in agreement with them.  She knows to contact our office if she develops concerns prior to her next appointment.     I provided 31 minutes of face-to-face time during this encounter and > 50% was spent counseling as documented under my assessment and plan.   Wanda VEAR Cornish, MD [facility] A DEPT OF [facility] [address] [place] [address] Dept: [phone] Dept Fax: [phone]   No orders of the defined types were placed in this encounter.   I,Jasmine M Lassiter,acting as a scribe for Wanda VEAR Cornish, MD.,have documented all relevant documentation on the behalf of Wanda VEAR Cornish, MD,as directed by  Wanda VEAR Cornish, MD while in the presence of Wanda VEAR Cornish, MD.

## 2023-10-31 ENCOUNTER — Inpatient Hospital Stay: Attending: Oncology | Admitting: Oncology

## 2023-10-31 ENCOUNTER — Other Ambulatory Visit: Payer: Self-pay | Admitting: Oncology

## 2023-10-31 ENCOUNTER — Encounter: Payer: Self-pay | Admitting: Oncology

## 2023-10-31 ENCOUNTER — Inpatient Hospital Stay

## 2023-10-31 VITALS — BP 128/67 | HR 52 | Temp 98.1°F | Resp 18 | Ht 67.5 in | Wt 173.6 lb

## 2023-10-31 DIAGNOSIS — R10816 Epigastric abdominal tenderness: Secondary | ICD-10-CM | POA: Insufficient documentation

## 2023-10-31 DIAGNOSIS — Z923 Personal history of irradiation: Secondary | ICD-10-CM | POA: Insufficient documentation

## 2023-10-31 DIAGNOSIS — E876 Hypokalemia: Secondary | ICD-10-CM | POA: Insufficient documentation

## 2023-10-31 DIAGNOSIS — C3491 Malignant neoplasm of unspecified part of right bronchus or lung: Secondary | ICD-10-CM

## 2023-10-31 DIAGNOSIS — K274 Chronic or unspecified peptic ulcer, site unspecified, with hemorrhage: Secondary | ICD-10-CM

## 2023-10-31 DIAGNOSIS — Z853 Personal history of malignant neoplasm of breast: Secondary | ICD-10-CM | POA: Insufficient documentation

## 2023-10-31 DIAGNOSIS — F419 Anxiety disorder, unspecified: Secondary | ICD-10-CM | POA: Diagnosis not present

## 2023-10-31 DIAGNOSIS — Z801 Family history of malignant neoplasm of trachea, bronchus and lung: Secondary | ICD-10-CM | POA: Insufficient documentation

## 2023-10-31 DIAGNOSIS — Z171 Estrogen receptor negative status [ER-]: Secondary | ICD-10-CM | POA: Diagnosis not present

## 2023-10-31 DIAGNOSIS — D649 Anemia, unspecified: Secondary | ICD-10-CM | POA: Insufficient documentation

## 2023-10-31 DIAGNOSIS — R0789 Other chest pain: Secondary | ICD-10-CM

## 2023-10-31 DIAGNOSIS — C50111 Malignant neoplasm of central portion of right female breast: Secondary | ICD-10-CM | POA: Diagnosis not present

## 2023-10-31 DIAGNOSIS — D539 Nutritional anemia, unspecified: Secondary | ICD-10-CM

## 2023-10-31 DIAGNOSIS — Z8041 Family history of malignant neoplasm of ovary: Secondary | ICD-10-CM | POA: Diagnosis not present

## 2023-10-31 DIAGNOSIS — Z9221 Personal history of antineoplastic chemotherapy: Secondary | ICD-10-CM | POA: Insufficient documentation

## 2023-10-31 DIAGNOSIS — G4700 Insomnia, unspecified: Secondary | ICD-10-CM

## 2023-10-31 LAB — CMP (CANCER CENTER ONLY)
ALT: 14 U/L (ref 0–44)
AST: 24 U/L (ref 15–41)
Albumin: 4.1 g/dL (ref 3.5–5.0)
Alkaline Phosphatase: 119 U/L (ref 38–126)
Anion gap: 12 (ref 5–15)
BUN: 19 mg/dL (ref 8–23)
CO2: 22 mmol/L (ref 22–32)
Calcium: 9.4 mg/dL (ref 8.9–10.3)
Chloride: 105 mmol/L (ref 98–111)
Creatinine: 1.09 mg/dL — ABNORMAL HIGH (ref 0.44–1.00)
GFR, Estimated: 56 mL/min — ABNORMAL LOW (ref >60)
Glucose, Bld: 166 mg/dL — ABNORMAL HIGH (ref 70–99)
Potassium: 3.4 mmol/L — ABNORMAL LOW (ref 3.5–5.1)
Sodium: 139 mmol/L (ref 135–145)
Total Bilirubin: 0.4 mg/dL (ref 0.0–1.2)
Total Protein: 6.5 g/dL (ref 6.5–8.1)

## 2023-10-31 LAB — CBC WITH DIFFERENTIAL (CANCER CENTER ONLY)
Abs Immature Granulocytes: 0.02 10*3/uL (ref 0.00–0.07)
Basophils Absolute: 0 10*3/uL (ref 0.0–0.1)
Basophils Relative: 0 %
Eosinophils Absolute: 0.3 10*3/uL (ref 0.0–0.5)
Eosinophils Relative: 4 %
HCT: 33 % — ABNORMAL LOW (ref 36.0–46.0)
Hemoglobin: 10.7 g/dL — ABNORMAL LOW (ref 12.0–15.0)
Immature Granulocytes: 0 %
Lymphocytes Relative: 20 %
Lymphs Abs: 1.4 10*3/uL (ref 0.7–4.0)
MCH: 27.1 pg (ref 26.0–34.0)
MCHC: 32.4 g/dL (ref 30.0–36.0)
MCV: 83.5 fL (ref 80.0–100.0)
Monocytes Absolute: 0.6 10*3/uL (ref 0.1–1.0)
Monocytes Relative: 9 %
Neutro Abs: 4.6 10*3/uL (ref 1.7–7.7)
Neutrophils Relative %: 67 %
Platelet Count: 226 10*3/uL (ref 150–400)
RBC: 3.95 MIL/uL (ref 3.87–5.11)
RDW: 13.3 % (ref 11.5–15.5)
WBC Count: 6.9 10*3/uL (ref 4.0–10.5)
nRBC: 0 % (ref 0.0–0.2)

## 2023-10-31 LAB — CEA (ACCESS): CEA (CHCC): 2.52 ng/mL (ref 0.00–5.00)

## 2023-10-31 MED ORDER — TRAMADOL HCL 50 MG PO TABS: 50.0000 mg | ORAL_TABLET | Freq: Four times a day (QID) | ORAL | 3 refills | Status: DC | PRN

## 2023-10-31 MED ORDER — PANTOPRAZOLE SODIUM 40 MG PO TBEC: 40.0000 mg | DELAYED_RELEASE_TABLET | Freq: Two times a day (BID) | ORAL | 5 refills | Status: DC

## 2023-10-31 MED ORDER — HYDROXYZINE HCL 25 MG PO TABS: 25.0000 mg | ORAL_TABLET | Freq: Three times a day (TID) | ORAL | 5 refills | Status: AC | PRN

## 2023-11-03 ENCOUNTER — Telehealth: Payer: Self-pay | Admitting: Oncology

## 2023-11-03 NOTE — Telephone Encounter (Signed)
 Patient has been scheduled for follow-up visit per 11/03/23 LOS.  LVM notifying pt of appt details, provided my direct number to pt if appt changes need to be made.

## 2023-11-05 ENCOUNTER — Telehealth: Payer: Self-pay

## 2023-11-05 DIAGNOSIS — C50111 Malignant neoplasm of central portion of right female breast: Secondary | ICD-10-CM

## 2023-11-05 DIAGNOSIS — D509 Iron deficiency anemia, unspecified: Secondary | ICD-10-CM

## 2023-11-05 DIAGNOSIS — C3491 Malignant neoplasm of unspecified part of right bronchus or lung: Secondary | ICD-10-CM

## 2023-11-05 DIAGNOSIS — R918 Other nonspecific abnormal finding of lung field: Secondary | ICD-10-CM

## 2023-11-05 DIAGNOSIS — K921 Melena: Secondary | ICD-10-CM

## 2023-11-05 NOTE — Telephone Encounter (Signed)
 Referral sent via fax to Dr. Charolett Copes office.

## 2023-11-05 NOTE — Telephone Encounter (Signed)
-----   Message from Nolia Baumgartner sent at 10/31/2023  5:51 PM EDT ----- Regarding: refer Refer Dr. Monico Anna for melena and iron def anemia

## 2023-11-06 ENCOUNTER — Ambulatory Visit (HOSPITAL_BASED_OUTPATIENT_CLINIC_OR_DEPARTMENT_OTHER)
Admission: RE | Admit: 2023-11-06 | Discharge: 2023-11-06 | Disposition: A | Discharge status: Home / Self Care | Source: Ambulatory Visit | Attending: Oncology | Admitting: Oncology

## 2023-11-06 DIAGNOSIS — C3491 Malignant neoplasm of unspecified part of right bronchus or lung: Secondary | ICD-10-CM | POA: Diagnosis not present

## 2023-11-06 MED ORDER — IOHEXOL 300 MG/ML  SOLN
100.0000 mL | Freq: Once | INTRAMUSCULAR | Status: AC | PRN
Start: 2023-11-06 — End: 2023-11-06
  Administered 2023-11-06: 75 mL via INTRAVENOUS

## 2023-11-10 ENCOUNTER — Other Ambulatory Visit: Payer: Self-pay | Admitting: Hematology and Oncology

## 2023-11-10 ENCOUNTER — Other Ambulatory Visit: Payer: Self-pay

## 2023-11-10 DIAGNOSIS — K921 Melena: Secondary | ICD-10-CM

## 2023-11-10 DIAGNOSIS — C3491 Malignant neoplasm of unspecified part of right bronchus or lung: Secondary | ICD-10-CM | POA: Diagnosis not present

## 2023-11-10 LAB — OCCULT BLOOD X 1 CARD TO LAB, STOOL
Fecal Occult Bld: NEGATIVE
Fecal Occult Bld: NEGATIVE
Fecal Occult Bld: NEGATIVE

## 2023-11-12 ENCOUNTER — Other Ambulatory Visit: Payer: Self-pay | Admitting: Cardiology

## 2023-11-17 ENCOUNTER — Telehealth: Payer: Self-pay

## 2023-11-17 DIAGNOSIS — D649 Anemia, unspecified: Secondary | ICD-10-CM | POA: Insufficient documentation

## 2023-11-17 NOTE — Telephone Encounter (Signed)
-----   Message from Dana [name] sent at 11/17/2023  1:12 PM EDT ----- Regarding: call Tell her CT chest is stable, looks okay and stool OB were negative for blood.  HOWEVER, none of the labs I ordered 6/6 to evaluate her anemia were done.  What happened was that I saw her late Friday afternoon so no one realized these had been ordered.  We need to get her in to get these drawn.

## 2023-11-17 NOTE — Telephone Encounter (Signed)
 Attempted to contact patient. No answer.

## 2023-12-03 ENCOUNTER — Ambulatory Visit: Admitting: Oncology

## 2023-12-03 ENCOUNTER — Inpatient Hospital Stay

## 2023-12-09 ENCOUNTER — Other Ambulatory Visit: Payer: Self-pay | Admitting: Cardiology

## 2023-12-17 NOTE — Progress Notes (Signed)
 [facility]  [address] [place],  [place]  [address] [phone]  Clinic Day: 12/18/23  Referring physician: Vicci Odor, PA  CHIEF COMPLAINT:  CC: Stage IA2 squamous cell carcinoma of the lung  Current Treatment: Observation after stereotactic radiation  HISTORY OF PRESENT ILLNESS:  Dana [name] is a 63 y.o. female with a history of stage I hormone receptor positive right breast cancer diagnosed in August 1999.  This was a 1.1 cm invasive ductal carcinoma, treated with lumpectomy, CMF chemotherapy, and radiation.  She did not tolerate tamoxifen, but has never had evidence of recurrence.  We began seeing her in August 2004, when she moved to the area.  She had been on yearly follow-up, but was lost to follow-up from April 2014 to April 2016.[name]  CT chest, abdomen and pelvis in April 2016 did not reveal any evidence of malignancy, but there was a 6 mm nodule in the posterior right upper lobe on the CT chest.  CT chest and July 2016 revealed resolution of the 6 mm groundglass nodule in the right upper lobe. Due to her personal history of breast cancer diagnosed at age 58, we have recommended genetic testing with the [facility] Hereditary Cancer Panel testing in July 2016.  This did not reveal any clinically significant mutations or variants of uncertain significance.  She underwent total abdominal hysterectomy for dysfunctional uterine bleeding, but her ovaries are intact.  She also has atrial fibrillation, history of pneumonia, mild to moderate tricuspid regurgitation, and history of prior stroke in 2014.  She had a squamous cell carcinoma removed from her right shoulder, and a squamous cell carcinoma in situ removed from the left anterior chest. She has a history of peptic ulcer disease.  She has had some anxiety and depression.  She undergoes basically annual CT chest for lung cancer screening due to her 35-pack-year history of smoking.  She quit smoking about 10 years  ago.   She developed a newly diagnosed lung cancer in January 2024. The lung cancer screening CT scan on January 8 revealed a new large irregular subpleural solid pulmonary nodule of the right middle lobe measuring 15.9 mm in mean diameter, for a lung RADS 4B, suspicious.  PET scan revealed a hypermetabolic right upper lobe pulmonary nodule most consistent with bronchogenic carcinoma. There was no evidence of metastatic adenopathy or distant metastatic disease.  She underwent bronchoscopy and biopsies with Dr. Brenna.  Cytology revealed non-small cell lung cancer with negative lymph node.   MRI of the brain did not reveal any evidence of intracranial metastasis.  Pulmonary function tests revealed a moderate degree of COPD with an FEV1 of 2.09 L, 75% of predicted.  She therefore had a clinical stage I non-small cell lung cancer.  We discussed the options of surgical resection versus stereotactic radiation and she chose the latter. She was treated by Dr. Dewey at [facility] and tolerated this well.  She completed stereotactic radiation in April of 2024.  CT chest in July revealed interval development of peripheral groundglass and airspace consolidation within the anterior basal right upper lobe and right middle lobe with a round ed dislike area of architectural distortion along the major fissure measuring 4.4 x 2.9 cm favored to represent postradiation changes.The underlying subpleural nodule was obscured but decreased in size from 1.4 x 1.3 cm to 1.1 x 0.9 cm.    Oncology History  Malignant neoplasm of central portion of breast in female, estrogen receptor negative (HCC)  03/25/2016 Initial  Diagnosis   Malignant neoplasm of central portion of breast in female, estrogen receptor negative (HCC)   Bronchogenic lung cancer, right (HCC)  07/26/2022 Initial Diagnosis   Bronchogenic lung cancer, right (HCC)   08/09/2022 Cancer Staging   Staging form: Lung, AJCC 8th Edition - Clinical stage from 08/09/2022: Stage  IA2 (cT1b, cN0, cM0) - Signed by Cornelius Wanda DEL, MD on 08/14/2022 Histopathologic type: Squamous cell carcinoma, NOS Stage prefix: Initial diagnosis Laterality: Right Tumor size (mm): 15.9 Lymph-vascular invasion (LVI): LVI not present (absent)/not identified Diagnostic confirmation: Positive histology Specimen type: Bronchial Biopsy Staged by: Managing physician Type of lung cancer: Resectable non-small cell lung cancer in inoperable patients treated with radiotherapy ECOG performance status: Grade 0 Symptoms: Absent Stage used in treatment planning: Yes National guidelines used in treatment planning: Yes Type of national guideline used in treatment planning: NCCN     INTERVAL HISTORY:  Dana [name] is here today for repeat clinical assessment for her stage IA2 squamous cell carcinoma of the lung found on lung cancer screening, and remote history of breast cancer. She had a clear mammogram on 09/24/23 and U/S just revealed dense fibroglandular tissue at the area of concern.  Patient states that she feels well but complains of fatigue and intermittent melena. She is taking occasional doses of Pepto Bismol but it is not helping much so I recommended she stop it. She has not gotten a call back from Dr. Lara office about a referral yet. She currently takes pantoprazole  40 mg BID. I prescribed Hydroxyzine  50 mg at bedtime at her request but she says it has caused her some nightmares, I instructed her to decrease this to half dose. She has a WBC of 7.5, low hemoglobin of 10.5 down from 10.7, and platelet count of 224,000. Her CMP is normal other than a stable low potassium of 3.4 and elevated alkaline phosphatase of 130. I advised her to continue working on increasing her potassium intake. Her B-12, folate, ferritin, iron, and TIBC today are pending and I will call her with the results. I will see her back in 2 months with CBC and CMP.   She denies fever, chills, night sweats, or other signs  of infection. She denies cardiorespiratory and gastrointestinal issues. She  denies pain. Her appetite is good and Her weight has been stable.   REVIEW OF SYSTEMS:  Review of Systems  Constitutional:  Positive for fatigue. Negative for appetite change, chills, diaphoresis, fever and unexpected weight change.  HENT:  Negative.  Negative for hearing loss, lump/mass, mouth sores, nosebleeds, sore throat, tinnitus, trouble swallowing and voice change.   Eyes: Negative.  Negative for eye problems and icterus.  Respiratory: Negative.  Negative for chest tightness, cough, hemoptysis, shortness of breath and wheezing.   Cardiovascular:  Negative for chest pain, leg swelling and palpitations.  Gastrointestinal: Negative.  Negative for abdominal distention, abdominal pain, blood in stool, constipation, diarrhea, nausea, rectal pain and vomiting.       Occasional melena  Endocrine: Negative.   Genitourinary: Negative.  Negative for bladder incontinence, difficulty urinating, dyspareunia, dysuria, frequency, hematuria, menstrual problem, nocturia, pelvic pain, vaginal bleeding and vaginal discharge.   Musculoskeletal:  Positive for arthralgias and back pain (mid back to anterior chest). Negative for flank pain, gait problem, myalgias, neck pain and neck stiffness.  Skin:  Positive for itching. Negative for rash and wound.  Neurological:  Positive for numbness (sternum and right breast). Negative for dizziness, extremity weakness, gait problem, headaches, light-headedness, seizures and speech difficulty.  Hematological: Negative.  Negative for adenopathy. Does not bruise/bleed easily.  Psychiatric/Behavioral:  Positive for depression and sleep disturbance. Negative for confusion, decreased concentration and suicidal ideas. The patient is nervous/anxious.     VITALS:  Blood pressure (!) 162/77, pulse (!) 51, temperature 98.2 F (36.8 C), temperature source Oral, resp. rate 18, height 5' 7.5 (1.715 m), weight  173 lb (78.5 kg), SpO2 96%.  Wt Readings from Last 3 Encounters:  12/18/23 173 lb (78.5 kg)  10/31/23 173 lb 9 oz (78.7 kg)  08/01/23 180 lb (81.6 kg)    Body mass index is 26.7 kg/m.  Performance status (ECOG): 1 - Symptomatic but completely ambulatory  PHYSICAL EXAM:  Physical Exam Vitals and nursing note reviewed. Exam conducted with a chaperone present.  Constitutional:      General: She is not in acute distress.    Appearance: Normal appearance. She is not ill-appearing, toxic-appearing or diaphoretic.  HENT:     Head: Normocephalic and atraumatic.     Right Ear: Tympanic membrane, ear canal and external ear normal. There is no impacted cerumen.     Left Ear: Tympanic membrane, ear canal and external ear normal. There is no impacted cerumen.     Nose: Nose normal. No congestion or rhinorrhea.     Mouth/Throat:     Mouth: Mucous membranes are moist.     Pharynx: Oropharynx is clear. No oropharyngeal exudate or posterior oropharyngeal erythema.  Eyes:     General: No scleral icterus.       Right eye: No discharge.        Left eye: No discharge.     Extraocular Movements: Extraocular movements intact.     Conjunctiva/sclera: Conjunctivae normal.     Pupils: Pupils are equal, round, and reactive to light.  Neck:     Vascular: No carotid bruit.  Cardiovascular:     Rate and Rhythm: Normal rate and regular rhythm.     Pulses: Normal pulses.     Heart sounds: Normal heart sounds. No murmur heard.    No friction rub. No gallop.  Pulmonary:     Effort: Pulmonary effort is normal. No respiratory distress.     Breath sounds: Normal breath sounds. No stridor. No wheezing, rhonchi or rales.  Chest:     Chest wall: No mass or tenderness.  Breasts:    Right: Normal. No inverted nipple, mass, nipple discharge or skin change.     Left: Normal. No inverted nipple, mass, nipple discharge or skin change.     Comments: Large scar in the posterior right shoulder Well healed right  axillary scar Scar in the medial right breast Scar in the left upper chest from her port No masses in either breast Abdominal:     General: Bowel sounds are normal. There is no distension.     Palpations: Abdomen is soft. There is no hepatomegaly, splenomegaly or mass.     Tenderness: There is abdominal tenderness in the epigastric area. There is no right CVA tenderness, left CVA tenderness, guarding or rebound.     Hernia: No hernia is present.  Musculoskeletal:        General: No swelling, tenderness, deformity or signs of injury. Normal range of motion.     Cervical back: Normal range of motion and neck supple. No rigidity, tenderness or bony tenderness.     Right lower leg: No edema.     Left lower leg: No edema.  Lymphadenopathy:     Cervical: No cervical adenopathy.  Upper Body:     Right upper body: No supraclavicular or axillary adenopathy.     Left upper body: No supraclavicular or axillary adenopathy.     Lower Body: No right inguinal adenopathy. No left inguinal adenopathy.  Skin:    General: Skin is warm and dry.     Coloration: Skin is not jaundiced or pale.     Findings: No bruising, erythema, lesion or rash.  Neurological:     General: No focal deficit present.     Mental Status: She is alert and oriented to person, place, and time. Mental status is at baseline.     Cranial Nerves: No cranial nerve deficit.     Sensory: No sensory deficit.     Motor: No weakness.     Coordination: Coordination normal.     Gait: Gait normal.     Deep Tendon Reflexes: Reflexes normal.  Psychiatric:        Mood and Affect: Mood normal.        Behavior: Behavior normal.        Thought Content: Thought content normal.        Judgment: Judgment normal.    LABS:      Latest Ref Rng & Units 12/18/2023    4:02 PM 10/31/2023    3:50 PM 07/03/2023    2:17 PM  CBC  WBC 4.0 - 10.5 K/uL 7.5  6.9  7.6   Hemoglobin 12.0 - 15.0 g/dL 89.4  89.2  87.3   Hematocrit 36.0 - 46.0 % 33.0  33.0   36.9   Platelets 150 - 400 K/uL 224  226  232       Latest Ref Rng & Units 12/18/2023    4:02 PM 10/31/2023    3:50 PM 07/03/2023    2:17 PM  CMP  Glucose 70 - 99 mg/dL 893  833  97   BUN 8 - 23 mg/dL 15  19  16    Creatinine 0.44 - 1.00 mg/dL 9.19  8.90  9.18   Sodium 135 - 145 mmol/L 140  139  140   Potassium 3.5 - 5.1 mmol/L 3.4  3.4  4.1   Chloride 98 - 111 mmol/L 107  105  105   CO2 22 - 32 mmol/L 23  22  26    Calcium  8.9 - 10.3 mg/dL 8.9  9.4  9.2   Total Protein 6.5 - 8.1 g/dL 6.6  6.5  7.1   Total Bilirubin 0.0 - 1.2 mg/dL 0.4  0.4  0.4   Alkaline Phos 38 - 126 U/L 130  119  119   AST 15 - 41 U/L 24  24  25    ALT 0 - 44 U/L 13  14  16     Lab Results  Component Value Date   CEA1 3.2 03/27/2023   CEA 2.52 10/31/2023   /  CEA  Date Value Ref Range Status  03/27/2023 3.2 0.0 - 4.7 ng/mL Final    Comment:    (NOTE)                             Nonsmokers          <3.9                             Smokers             <5.6 [employer] Electrochemiluminescence  Immunoassay (ECLIA) Values obtained with different assay methods or kits cannot be used interchangeably.  Results cannot be interpreted as absolute evidence of the presence or absence of malignant disease. Performed At: [facility] [address] [place], [place] [id number] Jennette Shorter MD Ey:[id number]    CEA ([facility])  Date Value Ref Range Status  10/31/2023 2.52 0.00 - 5.00 ng/mL Final    Comment:    (NOTE) This test was performed using Beck[employer] Coulter's paramagnetic chemiluminescent immunoassay. Values obtained from different assay methods cannot be used interchangeably. Please note that up to 8% of patients who smoke may see values 5.1-10.0 ng/ml and 1% of patients who smoke may see CEA levels >10.0 ng/ml. Performed at [facility], [address] [place], [place] [address]    No results found for: PSA1 No results found for: CAN199 No results found for: CAN125   No results found for: STEPHANY RINGS, A1GS, A2GS, BETS, BETA2SER, GAMS, MSPIKE, SPEI Lab Results  Component Value Date   TIBC 424 12/18/2023   TIBC 467 (H) 04/06/2020   FERRITIN 12 12/18/2023   FERRITIN 12 04/06/2020   IRONPCTSAT 8 (L) 12/18/2023   IRONPCTSAT 10 (L) 04/06/2020   Lab Results  Component Value Date   LDH 163 04/06/2020    STUDIES:  EXAM: 11/06/2023 CT CHEST WITH CONTRAST IMPRESSION: 1. No substantial change in post treatment scarring in the peripheral right middle lobe involving the chest wall and the anterior fifth and sixth ribs. 2. Interval resolution of the pulmonary nodule seen previously in the posterior right apex. 3. Similar upper normal mediastinal and right hilar lymph nodes. Stable trace right pleural effusion. 4. Small to moderate hiatal hernia. 5. Aortic Atherosclerosis (ICD10-I70.0) and Emphysema (ICD10-J43.9).     EXAM: 07/14/2023 CT CHEST WITH CONTRAST IMPRESSION: 1. Stable radiation changes involving the right middle lobe with extensive radiation fibrosis. No findings suspicious for residual or recurrent tumor. 2. Stable borderline mediastinal and hilar lymph nodes. No new or progressive findings. 3. No findings for metastatic disease involving the chest or upper abdomen. 4. Stable moderate to large hiatal hernia.   HISTORY:   Past Medical History:  Diagnosis Date   Asthma    Atypical chest pain 03/25/2016   Breast cancer (HCC)    COPD (chronic obstructive pulmonary disease) (HCC)    Dizziness 08/18/2017   Hx of adenomatous polyp of colon 08/09/2009   Hyperthyroidism 01/04/2016   Late effects of CVA (cerebrovascular accident) 03/25/2016   Lymphedema of right upper extremity 08/09/2020   Malignant neoplasm of central portion of breast in female, estrogen receptor negative (HCC) 03/25/2016   Obstructive sleep apnea syndrome 03/25/2016   Paroxysmal atrial fibrillation (HCC) 03/25/2016   Precordial chest  pain 08/16/2020    Past Surgical History:  Procedure Laterality Date   ABDOMINAL HYSTERECTOMY     APPENDECTOMY     BREAST BIOPSY     BRONCHIAL BIOPSY  07/02/2022   Procedure: BRONCHIAL BIOPSIES;  Surgeon: Brenna Adine CROME, DO;  Location: [facility]OS[facility];  Service: Pulmonary;;   BRONCHIAL NEEDLE ASPIRATION BIOPSY  07/02/2022   Procedure: BRONCHIAL NEEDLE ASPIRATION BIOPSIES;  Surgeon: Brenna Adine CROME, DO;  Location: [facility];  Service: Pulmonary;;   FINE NEEDLE ASPIRATION  07/02/2022   Procedure: FINE NEEDLE ASPIRATION (FNA) LINEAR;  Surgeon: Brenna Adine CROME, DO;  Location: [facility] ENDOS[facility];  Service: Pulmonary;;   lymph node removal     PORTACATH PLACEMENT     portacath removal     TONSILLECTOMY     VIDEO  BRONCHOSCOPY WITH ENDOBRONCHIAL ULTRASOUND  07/02/2022   Procedure: VIDEO BRONCHOSCOPY WITH ENDOBRONCHIAL ULTRASOUND;  Surgeon: Brenna Adine CROME, DO;  Location: [facility];  Service: Pulmonary;;    Family History  Problem Relation Age of Onset   Hypertension Maternal Grandmother    Lung cancer Maternal Uncle    Hypertension Mother    Ovarian cancer Paternal Aunt    Ovarian cancer Paternal Grandfather     Social History:  reports that she quit smoking about 10 years ago. Her smoking use included cigarettes. She has never used smokeless tobacco. She reports current alcohol use. She reports that she does not use drugs.The patient is accompanied by her husband today.  Allergies: No Known Allergies  Current Medications: Current Outpatient Medications  Medication Sig Dispense Refill   apixaban  (ELIQUIS ) 5 MG TABS tablet Take 1 tablet (5 mg total) by mouth 2 (two) times daily. 180 tablet 3   atenolol (TENORMIN) 50 MG tablet Take 50 mg by mouth daily.     atorvastatin  (LIPITOR) 80 MG tablet TAKE ONE TABLET BY MOUTH DAILY 90 tablet 1   diltiazem  (CARDIZEM ) 30 MG tablet Take 1 tablet (30 mg total) by mouth every 6 (six) hours as needed (palpitations). 30 tablet 1   hydrOXYzine  (ATARAX ) 25 MG  tablet Take 1 tablet (25 mg total) by mouth every 8 (eight) hours as needed for anxiety. 60 tablet 5   pantoprazole  (PROTONIX ) 40 MG tablet Take 1 tablet (40 mg total) by mouth 2 (two) times daily. 60 tablet 5   sertraline (ZOLOFT) 100 MG tablet Take 150 mg by mouth daily.      torsemide  (DEMADEX ) 10 MG tablet TAKE ONE TABLET BY MOUTH ONCE DAILY 90 tablet 1   traMADol  (ULTRAM ) 50 MG tablet Take 1 tablet (50 mg total) by mouth every 6 (six) hours as needed for moderate pain (pain score 4-6). 120 tablet 3   TRELEGY ELLIPTA  100-62.5-25 MCG/ACT AEPB Take 1 puff by mouth daily.     No current facility-administered medications for this visit.   ASSESSMENT & PLAN:  Assessment: 1.  Remote history of hormone receptor positive right breast cancer treated with lumpectomy and adjuvant chemotherapy radiation therapy.  She did not tolerate tamoxifen.  Genetic testing was negative.  She had a clear mammogram on 09/24/23 and US  just revealed dense fibroglandular tissue at the area of concern.  2.  Stage IA2 non-small cell lung cancer.  The postradiation changes in the right upper lobe nearly resolved.  There is a minimal increase in a paratracheal lymph node on her last CT scan of uncertain significance. CT chest done on 11/06/2023 revealed no substantial change in post treatment scarring in the peripheral right middle lobe involving the chest wall and the anterior fifth and sixth ribs, interval resolution of the pulmonary nodule seen previously in the posterior right apex, similar upper normal mediastinal and right hilar lymph nodes, and stable trace right pleural effusion. We will probably repeat this in December.   3. Symptoms of the right chest and right breast which are likely related to her recent radiation of the right middle lobe lesion. This includes pain, itching, and numbness. We did a CT of the chest which essentially remains unchanged but the pulmonary nodule in the right apex has resolved.   4.  New  anemia with significant decrease of hemoglobin from 12.6 in February to 10.7 in June, and associated with black stools, epigastric tenderness and also decrease in stool caliber. The anemia evaluation did not get done  and so I have reordered it for today. Stool hemoccults were negative. She will continue the pantoprazole  at BID and have a referral to Dr. Larene. She reports that her hemoglobin was up over 12 a few weeks ago but our reading today is still only 10.5.  5.  Mild hypokalemia.  I recommended an increase in her diet but the recheck is unchanged. I once again encouraged her to eat bananas and potassium rich foods.  6.  Anxiety regarding lung cancer and stress with caring for her elderly mother.  I ordered the hydroxyzine  to help her nerves but I will have her decrease the dose to 25 mg.  Plan: CT chest done on 11/06/2023 revealed no substantial change in post treatment scarring in the peripheral right middle lobe involving the chest wall and the anterior fifth and sixth ribs, interval resolution of the pulmonary nodule seen previously in the posterior right apex, similar upper normal mediastinal and right hilar lymph nodes, and stable trace right pleural effusion. She remains anemic. She has not gotten a call back from Dr. Lara office about a referral yet for her melena and low hemoglobin and I will look into this. She currently takes pantoprazole  40 mg BID. I had prescribed Hydroxyzine  50 mg at bedtime at her request but she says it has caused her some nightmares, I instructed her to decrease to half dose. She has a WBC of 7.5, low hemoglobin of 10.5 down from 10.7, and platelet count of 224,000. Her CMP is normal other than a stable low potassium of 3.4 and elevated alkaline phosphatase of 130. I advised her to continue working on increasing her potassium intake. Her B-12, folate, ferritin, iron, and TIBC today are pending and I will call her with the results. I will see her back in 2  months with CBC and CMP. The patient understands the plans discussed today and is in agreement with them.  She knows to contact our office if she develops concerns prior to her next appointment.     I provided 15 minutes of face-to-face time during this encounter and > 50% was spent counseling as documented under my assessment and plan.   Wanda VEAR Cornish, MD  [facility] DE[facility] OF [facility] [address] [place] [address] Dept: [phone] Dept Fax: [phone]   No orders of the defined types were placed in this encounter.   I,Jasmine M Lassiter,acting as a scribe for Wanda VEAR Cornish, MD.,have documented all relevant documentation on the behalf of Wanda VEAR Cornish, MD,as directed by  Wanda VEAR Cornish, MD while in the presence of Wanda VEAR Cornish, MD.

## 2023-12-18 ENCOUNTER — Inpatient Hospital Stay (HOSPITAL_BASED_OUTPATIENT_CLINIC_OR_DEPARTMENT_OTHER): Admitting: Oncology

## 2023-12-18 ENCOUNTER — Encounter: Payer: Self-pay | Admitting: Oncology

## 2023-12-18 ENCOUNTER — Inpatient Hospital Stay: Attending: Oncology

## 2023-12-18 ENCOUNTER — Other Ambulatory Visit: Payer: Self-pay | Admitting: Oncology

## 2023-12-18 VITALS — BP 162/77 | HR 51 | Temp 98.2°F | Resp 18 | Ht 67.5 in | Wt 173.0 lb

## 2023-12-18 DIAGNOSIS — R200 Anesthesia of skin: Secondary | ICD-10-CM | POA: Diagnosis not present

## 2023-12-18 DIAGNOSIS — Z8041 Family history of malignant neoplasm of ovary: Secondary | ICD-10-CM | POA: Diagnosis not present

## 2023-12-18 DIAGNOSIS — D649 Anemia, unspecified: Secondary | ICD-10-CM | POA: Insufficient documentation

## 2023-12-18 DIAGNOSIS — F418 Other specified anxiety disorders: Secondary | ICD-10-CM | POA: Insufficient documentation

## 2023-12-18 DIAGNOSIS — Z853 Personal history of malignant neoplasm of breast: Secondary | ICD-10-CM | POA: Insufficient documentation

## 2023-12-18 DIAGNOSIS — C3491 Malignant neoplasm of unspecified part of right bronchus or lung: Secondary | ICD-10-CM | POA: Diagnosis present

## 2023-12-18 DIAGNOSIS — E876 Hypokalemia: Secondary | ICD-10-CM | POA: Insufficient documentation

## 2023-12-18 DIAGNOSIS — L299 Pruritus, unspecified: Secondary | ICD-10-CM | POA: Insufficient documentation

## 2023-12-18 DIAGNOSIS — D500 Iron deficiency anemia secondary to blood loss (chronic): Secondary | ICD-10-CM | POA: Diagnosis not present

## 2023-12-18 DIAGNOSIS — R748 Abnormal levels of other serum enzymes: Secondary | ICD-10-CM | POA: Insufficient documentation

## 2023-12-18 DIAGNOSIS — Z87891 Personal history of nicotine dependence: Secondary | ICD-10-CM | POA: Insufficient documentation

## 2023-12-18 DIAGNOSIS — J90 Pleural effusion, not elsewhere classified: Secondary | ICD-10-CM | POA: Insufficient documentation

## 2023-12-18 DIAGNOSIS — Z923 Personal history of irradiation: Secondary | ICD-10-CM | POA: Insufficient documentation

## 2023-12-18 DIAGNOSIS — Z9221 Personal history of antineoplastic chemotherapy: Secondary | ICD-10-CM | POA: Diagnosis not present

## 2023-12-18 DIAGNOSIS — Z801 Family history of malignant neoplasm of trachea, bronchus and lung: Secondary | ICD-10-CM | POA: Diagnosis not present

## 2023-12-18 DIAGNOSIS — D539 Nutritional anemia, unspecified: Secondary | ICD-10-CM

## 2023-12-18 DIAGNOSIS — Z79899 Other long term (current) drug therapy: Secondary | ICD-10-CM | POA: Diagnosis not present

## 2023-12-18 LAB — CBC WITH DIFFERENTIAL (CANCER CENTER ONLY)
Abs Immature Granulocytes: 0.02 K/uL (ref 0.00–0.07)
Basophils Absolute: 0 K/uL (ref 0.0–0.1)
Basophils Relative: 0 %
Eosinophils Absolute: 0.2 K/uL (ref 0.0–0.5)
Eosinophils Relative: 3 %
HCT: 33 % — ABNORMAL LOW (ref 36.0–46.0)
Hemoglobin: 10.5 g/dL — ABNORMAL LOW (ref 12.0–15.0)
Immature Granulocytes: 0 %
Lymphocytes Relative: 18 %
Lymphs Abs: 1.3 K/uL (ref 0.7–4.0)
MCH: 25.9 pg — ABNORMAL LOW (ref 26.0–34.0)
MCHC: 31.8 g/dL (ref 30.0–36.0)
MCV: 81.3 fL (ref 80.0–100.0)
Monocytes Absolute: 0.8 K/uL (ref 0.1–1.0)
Monocytes Relative: 11 %
Neutro Abs: 5.1 K/uL (ref 1.7–7.7)
Neutrophils Relative %: 68 %
Platelet Count: 224 K/uL (ref 150–400)
RBC: 4.06 MIL/uL (ref 3.87–5.11)
RDW: 13.3 % (ref 11.5–15.5)
WBC Count: 7.5 K/uL (ref 4.0–10.5)
nRBC: 0 % (ref 0.0–0.2)

## 2023-12-18 LAB — CMP (CANCER CENTER ONLY)
ALT: 13 U/L (ref 0–44)
AST: 24 U/L (ref 15–41)
Albumin: 3.8 g/dL (ref 3.5–5.0)
Alkaline Phosphatase: 130 U/L — ABNORMAL HIGH (ref 38–126)
Anion gap: 10 (ref 5–15)
BUN: 15 mg/dL (ref 8–23)
CO2: 23 mmol/L (ref 22–32)
Calcium: 8.9 mg/dL (ref 8.9–10.3)
Chloride: 107 mmol/L (ref 98–111)
Creatinine: 0.8 mg/dL (ref 0.44–1.00)
GFR, Estimated: >60 mL/min (ref >60)
Glucose, Bld: 106 mg/dL — ABNORMAL HIGH (ref 70–99)
Potassium: 3.4 mmol/L — ABNORMAL LOW (ref 3.5–5.1)
Sodium: 140 mmol/L (ref 135–145)
Total Bilirubin: 0.4 mg/dL (ref 0.0–1.2)
Total Protein: 6.6 g/dL (ref 6.5–8.1)

## 2023-12-18 LAB — FOLATE: Folate: 20.6 ng/mL (ref >5.9)

## 2023-12-18 LAB — IRON AND TIBC
Iron: 34 ug/dL (ref 28–170)
Saturation Ratios: 8 % — ABNORMAL LOW (ref 10.4–31.8)
TIBC: 424 ug/dL (ref 250–450)
UIBC: 390 ug/dL

## 2023-12-18 LAB — VITAMIN B12: Vitamin B-12: 225 pg/mL (ref 180–914)

## 2023-12-18 LAB — FERRITIN: Ferritin: 12 ng/mL (ref 11–307)

## 2023-12-19 ENCOUNTER — Telehealth: Payer: Self-pay | Admitting: Oncology

## 2023-12-19 NOTE — Telephone Encounter (Signed)
 Patient has been scheduled for follow-up visit per 12/19/23 LOS.  LVM notifying pt of appt details, provided my direct number to pt if appt changes need to be made.

## 2023-12-28 DIAGNOSIS — D500 Iron deficiency anemia secondary to blood loss (chronic): Secondary | ICD-10-CM | POA: Insufficient documentation

## 2023-12-31 ENCOUNTER — Telehealth: Payer: Self-pay

## 2023-12-31 NOTE — Telephone Encounter (Signed)
 Patient notified of message and will start taking this medication.

## 2023-12-31 NOTE — Telephone Encounter (Signed)
-----   Message from Wanda VEAR Cornish sent at 12/28/2023  6:20 PM EDT ----- Regarding: call Tell her she is very low on iron, rec she take 1 daily.  If she is unable to tolerate (GI upset or constipation), we can give it IV.  Her B12 is low normal so I think she would also benefit from taking 1 daily of that

## 2024-02-17 ENCOUNTER — Other Ambulatory Visit: Payer: Self-pay | Admitting: Cardiology

## 2024-02-18 ENCOUNTER — Inpatient Hospital Stay: Payer: Self-pay

## 2024-02-18 ENCOUNTER — Inpatient Hospital Stay: Admitting: Oncology

## 2024-02-18 DIAGNOSIS — C3491 Malignant neoplasm of unspecified part of right bronchus or lung: Secondary | ICD-10-CM

## 2024-03-03 ENCOUNTER — Inpatient Hospital Stay (HOSPITAL_BASED_OUTPATIENT_CLINIC_OR_DEPARTMENT_OTHER): Admitting: Oncology

## 2024-03-03 ENCOUNTER — Other Ambulatory Visit: Payer: Self-pay | Admitting: Oncology

## 2024-03-03 ENCOUNTER — Inpatient Hospital Stay: Attending: Oncology

## 2024-03-03 ENCOUNTER — Encounter: Payer: Self-pay | Admitting: Oncology

## 2024-03-03 VITALS — BP 153/92 | HR 53 | Temp 98.3°F | Resp 18 | Ht 67.5 in | Wt 167.9 lb

## 2024-03-03 DIAGNOSIS — F4321 Adjustment disorder with depressed mood: Secondary | ICD-10-CM | POA: Diagnosis not present

## 2024-03-03 DIAGNOSIS — C3411 Malignant neoplasm of upper lobe, right bronchus or lung: Secondary | ICD-10-CM | POA: Insufficient documentation

## 2024-03-03 DIAGNOSIS — Z853 Personal history of malignant neoplasm of breast: Secondary | ICD-10-CM | POA: Diagnosis not present

## 2024-03-03 DIAGNOSIS — L299 Pruritus, unspecified: Secondary | ICD-10-CM | POA: Diagnosis not present

## 2024-03-03 DIAGNOSIS — D500 Iron deficiency anemia secondary to blood loss (chronic): Secondary | ICD-10-CM

## 2024-03-03 DIAGNOSIS — J90 Pleural effusion, not elsewhere classified: Secondary | ICD-10-CM | POA: Insufficient documentation

## 2024-03-03 DIAGNOSIS — D509 Iron deficiency anemia, unspecified: Secondary | ICD-10-CM | POA: Diagnosis not present

## 2024-03-03 DIAGNOSIS — Z8041 Family history of malignant neoplasm of ovary: Secondary | ICD-10-CM | POA: Diagnosis not present

## 2024-03-03 DIAGNOSIS — Z923 Personal history of irradiation: Secondary | ICD-10-CM | POA: Diagnosis not present

## 2024-03-03 DIAGNOSIS — C3491 Malignant neoplasm of unspecified part of right bronchus or lung: Secondary | ICD-10-CM

## 2024-03-03 DIAGNOSIS — Z87891 Personal history of nicotine dependence: Secondary | ICD-10-CM | POA: Diagnosis not present

## 2024-03-03 DIAGNOSIS — R200 Anesthesia of skin: Secondary | ICD-10-CM | POA: Diagnosis not present

## 2024-03-03 DIAGNOSIS — Z801 Family history of malignant neoplasm of trachea, bronchus and lung: Secondary | ICD-10-CM | POA: Diagnosis not present

## 2024-03-03 DIAGNOSIS — Z9221 Personal history of antineoplastic chemotherapy: Secondary | ICD-10-CM | POA: Insufficient documentation

## 2024-03-03 LAB — CBC WITH DIFFERENTIAL (CANCER CENTER ONLY)
Abs Immature Granulocytes: 0.02 K/uL (ref 0.00–0.07)
Basophils Absolute: 0 K/uL (ref 0.0–0.1)
Basophils Relative: 0 %
Eosinophils Absolute: 0.2 K/uL (ref 0.0–0.5)
Eosinophils Relative: 2 %
HCT: 34.5 % — ABNORMAL LOW (ref 36.0–46.0)
Hemoglobin: 11.2 g/dL — ABNORMAL LOW (ref 12.0–15.0)
Immature Granulocytes: 0 %
Lymphocytes Relative: 20 %
Lymphs Abs: 1.5 K/uL (ref 0.7–4.0)
MCH: 27.3 pg (ref 26.0–34.0)
MCHC: 32.5 g/dL (ref 30.0–36.0)
MCV: 83.9 fL (ref 80.0–100.0)
Monocytes Absolute: 0.8 K/uL (ref 0.1–1.0)
Monocytes Relative: 11 %
Neutro Abs: 5 K/uL (ref 1.7–7.7)
Neutrophils Relative %: 67 %
Platelet Count: 208 K/uL (ref 150–400)
RBC: 4.11 MIL/uL (ref 3.87–5.11)
RDW: 15 % (ref 11.5–15.5)
WBC Count: 7.5 K/uL (ref 4.0–10.5)
nRBC: 0 % (ref 0.0–0.2)

## 2024-03-03 LAB — CMP (CANCER CENTER ONLY)
ALT: 20 U/L (ref 0–44)
AST: 30 U/L (ref 15–41)
Albumin: 4 g/dL (ref 3.5–5.0)
Alkaline Phosphatase: 106 U/L (ref 38–126)
Anion gap: 9 (ref 5–15)
BUN: 15 mg/dL (ref 8–23)
CO2: 24 mmol/L (ref 22–32)
Calcium: 9.5 mg/dL (ref 8.9–10.3)
Chloride: 108 mmol/L (ref 98–111)
Creatinine: 0.86 mg/dL (ref 0.44–1.00)
GFR, Estimated: >60 mL/min (ref >60)
Glucose, Bld: 116 mg/dL — ABNORMAL HIGH (ref 70–99)
Potassium: 3.9 mmol/L (ref 3.5–5.1)
Sodium: 140 mmol/L (ref 135–145)
Total Bilirubin: 0.4 mg/dL (ref 0.0–1.2)
Total Protein: 7 g/dL (ref 6.5–8.1)

## 2024-03-03 NOTE — Progress Notes (Signed)
 Dana [name]  [address] [place],  [place]  [address] [phone]  [name] Day: 03/03/2024  Referring physician: Vicci Odor, PA  CHIEF COMPLAINT:  CC: Stage IA2 squamous cell carcinoma of the lung  Current Treatment: Observation after stereotactic radiation  HISTORY OF PRESENT ILLNESS:  Dana [name] is a 63 y.o. female with a history of stage I hormone receptor positive right breast cancer diagnosed in August 1999.  This was a 1.1 cm invasive ductal carcinoma, treated with lumpectomy, CMF chemotherapy, and radiation.  She did not tolerate tamoxifen, but has never had evidence of recurrence.  We began seeing her in August 2004, when she moved to the area.  She had been on yearly follow-up, but was lost to follow-up from April 2014 to April 2016.[name]  CT chest, abdomen and pelvis in April 2016 did not reveal any evidence of malignancy, but there was a 6 mm nodule in the posterior right upper lobe on the CT chest.  CT chest and July 2016 revealed resolution of the 6 mm groundglass nodule in the right upper lobe. Due to her personal history of breast cancer diagnosed at age 49, we have recommended genetic testing with the [name] Hereditary Cancer Panel testing in July 2016.  This did not reveal any clinically significant mutations or variants of uncertain significance.  She underwent total abdominal hysterectomy for dysfunctional uterine bleeding, but her ovaries are intact.  She also has atrial fibrillation, history of pneumonia, mild to moderate tricuspid regurgitation, and history of prior stroke in 2014.  She had a squamous cell carcinoma removed from her right shoulder, and a squamous cell carcinoma in situ removed from the left anterior chest. She has a history of peptic ulcer disease.  She has had some anxiety and depression.  She undergoes basically annual CT chest for lung cancer screening due to her 35-pack-year history of smoking.  She quit smoking about 10 years  ago.   She developed a newly diagnosed lung cancer in January 2024. The lung cancer screening CT scan on January 8 revealed a new large irregular subpleural solid pulmonary nodule of the right middle lobe measuring 15.9 mm in mean diameter, for a lung RADS 4B, suspicious.  PET scan revealed a hypermetabolic right upper lobe pulmonary nodule most consistent with bronchogenic carcinoma. There was no evidence of metastatic adenopathy or distant metastatic disease.  She underwent bronchoscopy and biopsies with [facility] [name].  Cytology revealed non-small cell lung cancer with negative lymph node.   MRI of the brain did not reveal any evidence of intracranial metastasis.  Pulmonary function tests revealed a moderate degree of COPD with an FEV1 of 2.09 L, 75% of predicted.  She therefore had a clinical stage I non-small cell lung cancer.  We discussed the options of surgical resection versus stereotactic radiation and she chose the latter. She was treated by Dana [name] at [name] and tolerated this well.  She completed stereotactic radiation in April of 2024.  CT chest in July revealed interval development of peripheral groundglass and airspace consolidation within the anterior basal right upper lobe and right middle lobe with a round ed dislike area of architectural distortion along the major fissure measuring 4.4 x 2.9 cm favored to represent postradiation changes.The underlying subpleural nodule was obscured but decreased in size from 1.4 x 1.3 cm to 1.1 x 0.9 cm.    Oncology History  Malignant neoplasm of central portion of breast in female, estrogen receptor negative (HCC)  03/25/2016 Initial  Diagnosis   Malignant neoplasm of central portion of breast in female, estrogen receptor negative (HCC)   Bronchogenic lung cancer, right (HCC)  07/26/2022 Initial Diagnosis   Bronchogenic lung cancer, right (HCC)   08/09/2022 Cancer Staging   Staging form: Lung, AJCC 8th Edition - Clinical stage from 08/09/2022: Stage  IA2 (cT1b, cN0, cM0) - Signed by Dana Wanda DEL, MD on 08/14/2022 Histopathologic type: Squamous cell carcinoma, NOS Stage prefix: Initial diagnosis Laterality: Right Tumor size (mm): 15.9 Lymph-vascular invasion (LVI): LVI not present (absent)/not identified Diagnostic confirmation: Positive histology Specimen type: Bronchial Biopsy Staged by: Managing physician Type of lung cancer: Resectable non-small cell lung cancer in inoperable patients treated with radiotherapy ECOG performance status: Grade 0 Symptoms: Absent Stage used in treatment planning: Yes National guidelines used in treatment planning: Yes Type of national guideline used in treatment planning: NCCN     INTERVAL HISTORY:  Dana [name] is here today for repeat clinical assessment for her stage IA2 squamous cell carcinoma of the lung found on lung cancer screening, and remote history of breast cancer. She had a clear mammogram on 09/24/23 and U/S just revealed dense fibroglandular tissue at the area of concern.  Patient states that she feels ok but complains of muscle spasm. She had to place her mom on Hospice yesterday. She is experiencing some anticipatory grief. At her last visit in July she was found to be iron deficient with a hemoglobin down to 10.5. She was taking oral iron once daily but states it gave her mild stomach upset. I instructed her to take this with food. She has a WBC of 7.5, low hemoglobin of 11.2 improved from 10.5, and platelet count of 208,000. Her CMP is completely normal. Her last CT chest was in June and she will be due for repeat in December. I will see her back in 2 months with CBC, CMP, CEA, iron, TIBC, ferritin, and CT chest. She still needs to see Dr. Larene when things settle down for her. She denies fever, chills, night sweats, or other signs of infection. She denies cardiorespiratory and gastrointestinal issues. She  denies pain. Her appetite is poor but could be due to her current situation.  Her weight has decreased 6 pounds over last 2 months.   REVIEW OF SYSTEMS:  Review of Systems  Constitutional:  Positive for appetite change (decreased) and fatigue. Negative for chills, diaphoresis, fever and unexpected weight change.  HENT:  Negative.  Negative for hearing loss, lump/mass, mouth sores, nosebleeds, sore throat, tinnitus, trouble swallowing and voice change.   Eyes: Negative.  Negative for eye problems and icterus.  Respiratory: Negative.  Negative for chest tightness, cough, hemoptysis, shortness of breath and wheezing.   Cardiovascular:  Negative for chest pain, leg swelling and palpitations.  Gastrointestinal: Negative.  Negative for abdominal distention, abdominal pain, blood in stool, constipation, diarrhea, nausea, rectal pain and vomiting.       Occasional melena  Endocrine: Negative.   Genitourinary: Negative.  Negative for bladder incontinence, difficulty urinating, dyspareunia, dysuria, frequency, hematuria, menstrual problem, nocturia, pelvic pain, vaginal bleeding and vaginal discharge.   Musculoskeletal:  Positive for arthralgias, back pain (mid back to anterior chest) and myalgias. Negative for flank pain, gait problem, neck pain and neck stiffness.       Muscle spasms  Skin:  Positive for itching. Negative for rash and wound.  Neurological:  Positive for numbness (sternum and right breast). Negative for dizziness, extremity weakness, gait problem, headaches, light-headedness, seizures and speech difficulty.  Hematological: Negative.  Negative for adenopathy. Does not bruise/bleed easily.  Psychiatric/Behavioral:  Positive for depression and sleep disturbance. Negative for confusion, decreased concentration and suicidal ideas. The patient is nervous/anxious.     VITALS:  Blood pressure (!) 153/92, pulse (!) 53, temperature 98.3 F (36.8 C), temperature source Oral, resp. rate 18, height 5' 7.5 (1.715 m), weight 167 lb 14.4 oz (76.2 kg), peak flow 96 L/min.  Wt  Readings from Last 3 Encounters:  03/03/24 167 lb 14.4 oz (76.2 kg)  12/18/23 173 lb (78.5 kg)  10/31/23 173 lb 9 oz (78.7 kg)    Body mass index is 25.91 kg/m.  Performance status (ECOG): 1 - Symptomatic but completely ambulatory  PHYSICAL EXAM:  Physical Exam Vitals and nursing note reviewed.  Constitutional:      General: She is not in acute distress.    Appearance: Normal appearance. She is not ill-appearing, toxic-appearing or diaphoretic.  HENT:     Head: Normocephalic and atraumatic.     Right Ear: Tympanic membrane, ear canal and external ear normal. There is no impacted cerumen.     Left Ear: Tympanic membrane, ear canal and external ear normal. There is no impacted cerumen.     Nose: Nose normal. No congestion or rhinorrhea.     Mouth/Throat:     Mouth: Mucous membranes are moist.     Pharynx: Oropharynx is clear. No oropharyngeal exudate or posterior oropharyngeal erythema.  Eyes:     General: No scleral icterus.       Right eye: No discharge.        Left eye: No discharge.     Extraocular Movements: Extraocular movements intact.     Conjunctiva/sclera: Conjunctivae normal.     Pupils: Pupils are equal, round, and reactive to light.  Neck:     Vascular: No carotid bruit.  Cardiovascular:     Rate and Rhythm: Normal rate and regular rhythm.     Pulses: Normal pulses.     Heart sounds: Normal heart sounds. No murmur heard.    No friction rub. No gallop.  Pulmonary:     Effort: Pulmonary effort is normal. No respiratory distress.     Breath sounds: Normal breath sounds. No stridor. No wheezing, rhonchi or rales.  Chest:     Chest wall: No mass or tenderness.  Breasts:    Right: Normal. No inverted nipple, mass, nipple discharge or skin change.     Left: Normal. No inverted nipple, mass, nipple discharge or skin change.     Comments: Well healed scar in the upper outer quadrant of the right breast No masses in either breasts Abdominal:     General: Bowel sounds  are normal. There is no distension.     Palpations: Abdomen is soft. There is no hepatomegaly, splenomegaly or mass.     Tenderness: There is no abdominal tenderness. There is no right CVA tenderness, left CVA tenderness, guarding or rebound.     Hernia: No hernia is present.  Musculoskeletal:        General: No swelling, tenderness, deformity or signs of injury. Normal range of motion.     Cervical back: Normal range of motion and neck supple. No rigidity, tenderness or bony tenderness.     Right lower leg: No edema.     Left lower leg: No edema.  Lymphadenopathy:     Cervical: No cervical adenopathy.     Upper Body:     Right upper body: No supraclavicular or axillary adenopathy.  Left upper body: No supraclavicular or axillary adenopathy.     Lower Body: No right inguinal adenopathy. No left inguinal adenopathy.  Skin:    General: Skin is warm and dry.     Coloration: Skin is not jaundiced or pale.     Findings: No bruising, erythema, lesion or rash.  Neurological:     General: No focal deficit present.     Mental Status: She is alert and oriented to person, place, and time. Mental status is at baseline.     Cranial Nerves: No cranial nerve deficit.     Sensory: No sensory deficit.     Motor: No weakness.     Coordination: Coordination normal.     Gait: Gait normal.     Deep Tendon Reflexes: Reflexes normal.  Psychiatric:        Mood and Affect: Mood normal.        Behavior: Behavior normal.        Thought Content: Thought content normal.        Judgment: Judgment normal.    LABS:      Latest Ref Rng & Units 03/03/2024    3:59 PM 12/18/2023    4:02 PM 10/31/2023    3:50 PM  CBC  WBC 4.0 - 10.5 K/uL 7.5  7.5  6.9   Hemoglobin 12.0 - 15.0 g/dL 88.7  89.4  89.2   Hematocrit 36.0 - 46.0 % 34.5  33.0  33.0   Platelets 150 - 400 K/uL 208  224  226       Latest Ref Rng & Units 03/03/2024    3:59 PM 12/18/2023    4:02 PM 10/31/2023    3:50 PM  CMP  Glucose 70 - 99 mg/dL  883  893  833   BUN 8 - 23 mg/dL 15  15  19    Creatinine 0.44 - 1.00 mg/dL 9.13  9.19  8.90   Sodium 135 - 145 mmol/L 140  140  139   Potassium 3.5 - 5.1 mmol/L 3.9  3.4  3.4   Chloride 98 - 111 mmol/L 108  107  105   CO2 22 - 32 mmol/L 24  23  22    Calcium  8.9 - 10.3 mg/dL 9.5  8.9  9.4   Total Protein 6.5 - 8.1 g/dL 7.0  6.6  6.5   Total Bilirubin 0.0 - 1.2 mg/dL 0.4  0.4  0.4   Alkaline Phos 38 - 126 U/L 106  130  119   AST 15 - 41 U/L 30  24  24    ALT 0 - 44 U/L 20  13  14     Lab Results  Component Value Date   CEA1 3.2 03/27/2023   CEA 2.52 10/31/2023   /  CEA  Date Value Ref Range Status  03/27/2023 3.2 0.0 - 4.7 ng/mL Final    Comment:    (NOTE)                             Nonsmokers          <3.9                             Smokers             <5.6 Roche [employer] Electrochemiluminescence Immunoassay (ECLIA) Values obtained with different assay methods or kits cannot be used interchangeably.  Results cannot be  interpreted as absolute evidence of the presence or absence of malignant disease. Performed At: [facility] [address] [place], [place] [id number] Jennette Shorter MD Ey:[phone]    CEA ([facility])  Date Value Ref Range Status  10/31/2023 2.52 0.00 - 5.00 ng/mL Final    Comment:    (NOTE) This test was performed using Beck[employer] Coulter's paramagnetic chemiluminescent immunoassay. Values obtained from different assay methods cannot be used interchangeably. Please note that up to 8% of patients who smoke may see values 5.1-10.0 ng/ml and 1% of patients who smoke may see CEA levels >10.0 ng/ml. Performed at [facility], [address] [place], [place] [address]    No results found for: PSA1 No results found for: CAN199 No results found for: CAN125  No results found for: STEPHANY RINGS, A1GS, A2GS, BETS, BETA2SER, GAMS, MSPIKE, SPEI Lab Results  Component Value Date   TIBC 424 12/18/2023    TIBC 467 (H) 04/06/2020   FERRITIN 12 12/18/2023   FERRITIN 12 04/06/2020   IRONPCTSAT 8 (L) 12/18/2023   IRONPCTSAT 10 (L) 04/06/2020   Lab Results  Component Value Date   LDH 163 04/06/2020    STUDIES:  EXAM: 11/06/2023 CT CHEST WITH CONTRAST IMPRESSION: 1. No substantial change in post treatment scarring in the peripheral right middle lobe involving the chest wall and the anterior fifth and sixth ribs. 2. Interval resolution of the pulmonary nodule seen previously in the posterior right apex. 3. Similar upper normal mediastinal and right hilar lymph nodes. Stable trace right pleural effusion. 4. Small to moderate hiatal hernia. 5. Aortic Atherosclerosis (ICD10-I70.0) and Emphysema (ICD10-J43.9).   HISTORY:   Past Medical History:  Diagnosis Date   Asthma    Atypical chest pain 03/25/2016   Breast cancer (HCC)    COPD (chronic obstructive pulmonary disease) (HCC)    Dizziness 08/18/2017   Hx of adenomatous polyp of colon 08/09/2009   Hyperthyroidism 01/04/2016   Late effects of CVA (cerebrovascular accident) 03/25/2016   Lymphedema of right upper extremity 08/09/2020   Malignant neoplasm of central portion of breast in female, estrogen receptor negative (HCC) 03/25/2016   Obstructive sleep apnea syndrome 03/25/2016   Paroxysmal atrial fibrillation (HCC) 03/25/2016   Precordial chest pain 08/16/2020    Past Surgical History:  Procedure Laterality Date   ABDOMINAL HYSTERECTOMY     APPENDECTOMY     BREAST BIOPSY     BRONCHIAL BIOPSY  07/02/2022   Procedure: BRONCHIAL BIOPSIES;  Surgeon: [name] Adine CROME, DO;  Location: [facility];  Service: Pulmonary;;   BRONCHIAL NEEDLE ASPIRATION BIOPSY  07/02/2022   Procedure: BRONCHIAL NEEDLE ASPIRATION BIOPSIES;  Surgeon: [name] Adine CROME, DO;  Location: [facility];  Service: Pulmonary;;   FINE NEEDLE ASPIRATION  07/02/2022   Procedure: FINE NEEDLE ASPIRATION (FNA) LINEAR;  Surgeon: [name] Adine CROME, DO;  Location: [facility];   Service: Pulmonary;;   lymph node removal     PORTACATH PLACEMENT     portacath removal     TONSILLECTOMY     VIDEO BRONCHOSCOPY WITH ENDOBRONCHIAL ULTRASOUND  07/02/2022   Procedure: VIDEO BRONCHOSCOPY WITH ENDOBRONCHIAL ULTRASOUND;  Surgeon: [name] Adine CROME, DO;  Location: [facility];  Service: Pulmonary;;    Family History  Problem Relation Age of Onset   Hypertension Maternal Grandmother    Lung cancer Maternal Uncle    Hypertension Mother    Ovarian cancer Paternal Aunt    Ovarian cancer Paternal Grandfather     Social History:  reports that she quit smoking about  11 years ago. Her smoking use included cigarettes. She has never used smokeless tobacco. She reports current alcohol use. She reports that she does not use drugs.The patient is accompanied by her husband today.  Allergies: No Known Allergies  Current Medications: Current Outpatient Medications  Medication Sig Dispense Refill   apixaban  (ELIQUIS ) 5 MG TABS tablet Take 1 tablet (5 mg total) by mouth 2 (two) times daily. 180 tablet 3   atenolol (TENORMIN) 50 MG tablet Take 50 mg by mouth daily.     atorvastatin  (LIPITOR) 80 MG tablet TAKE ONE TABLET BY MOUTH DAILY 90 tablet 1   diltiazem  (CARDIZEM ) 30 MG tablet Take 1 tablet (30 mg total) by mouth every 6 (six) hours as needed (palpitations). 30 tablet 1   hydrOXYzine  (ATARAX ) 25 MG tablet Take 1 tablet (25 mg total) by mouth every 8 (eight) hours as needed for anxiety. 60 tablet 5   pantoprazole  (PROTONIX ) 40 MG tablet Take 1 tablet (40 mg total) by mouth 2 (two) times daily. 60 tablet 5   sertraline (ZOLOFT) 100 MG tablet Take 150 mg by mouth daily.      torsemide  (DEMADEX ) 10 MG tablet TAKE ONE TABLET BY MOUTH ONCE DAILY 90 tablet 1   traMADol  (ULTRAM ) 50 MG tablet Take 1 tablet (50 mg total) by mouth every 6 (six) hours as needed for moderate pain (pain score 4-6). 120 tablet 3   TRELEGY ELLIPTA  100-62.5-25 MCG/ACT AEPB Take 1 puff by mouth daily.     No current  facility-administered medications for this visit.   ASSESSMENT & PLAN:  Assessment: 1.  Remote history of hormone receptor positive right breast cancer treated with lumpectomy and adjuvant chemotherapy radiation therapy.  She did not tolerate tamoxifen.  Genetic testing was negative.  She had a clear mammogram on 09/24/23 and US  just revealed dense fibroglandular tissue at the area of concern.  2.  Stage IA2 non-small cell lung cancer.  The postradiation changes in the right upper lobe nearly resolved.  There is a minimal increase in a paratracheal lymph node on her last CT scan of uncertain significance. CT chest done on 11/06/2023 revealed no substantial change in post treatment scarring in the peripheral right middle lobe involving the chest wall and the anterior fifth and sixth ribs, interval resolution of the pulmonary nodule seen previously in the posterior right apex, similar upper normal mediastinal and right hilar lymph nodes, and stable trace right pleural effusion. We will repeat this in December.   3. Symptoms of the right chest and right breast which are likely related to her recent radiation of the right middle lobe lesion. This includes pain, itching, and numbness. We did a CT of the chest which essentially remains unchanged but the pulmonary nodule in the right apex has resolved.   4.  Iron deficiency anemia diagnosed in June. She did have black stools, epigastric tenderness and also decrease in stool caliber. The anemia evaluation did not get done and so I have reordered it for today. Stool hemoccults were negative. She will continue the pantoprazole  at [facility] and have a referral to Dr. Larene. Her hemoglobin is now up to 11.2 on the iron supplement. She still needs to see Dr. Larene when things settle down.   5. Anticipatory Grief. Her mother was placed on Hospice care yesterday and she has a very short time left.   6.  Anxiety regarding lung cancer and stress with caring for  her elderly mother.   Plan: She had to place  her mom on Hospice yesterday. She is experiencing some anticipatory grief. At her last visit in July she was found to be iron deficient with a hemoglobin down to 10.5. She was taking oral iron once daily but states it gave her mild stomach upset. I instructed her to take this with food. She has a WBC of 7.5, low hemoglobin of 11.2 improved from 10.5, and platelet count of 208,000. Her CMP is completely normal. Her last CT chest was in June and she will be due for repeat in December. I will see her back in 2 months with CBC, CMP, CEA, iron, TIBC, ferritin, and CT chest. She still needs to see Dr. Larene when things settle down. The patient understands the plans discussed today and is in agreement with them.  She knows to contact our office if she develops concerns prior to her next appointment.    I provided 8 minutes of face-to-face time during this encounter and > 50% was spent counseling as documented under my assessment and plan.   Wanda VEAR Cornish, MD [facility] - A DEPT OF [facility] [place] [name] [address] [place] [address] Dept: [phone] Dept Fax: [phone]   No orders of the defined types were placed in this encounter.   I,Dana [name],acting as a scribe for Wanda VEAR Cornish, MD.,have documented all relevant documentation on the behalf of Wanda VEAR Cornish, MD,as directed by  Wanda VEAR Cornish, MD while in the presence of Wanda VEAR Cornish, MD.

## 2024-03-04 NOTE — Progress Notes (Signed)
 This encounter was created in error - please disregard.

## 2024-04-29 ENCOUNTER — Inpatient Hospital Stay: Attending: Oncology

## 2024-04-29 ENCOUNTER — Ambulatory Visit (HOSPITAL_BASED_OUTPATIENT_CLINIC_OR_DEPARTMENT_OTHER)
Admission: RE | Admit: 2024-04-29 | Discharge: 2024-04-29 | Disposition: A | Source: Ambulatory Visit | Attending: Oncology | Admitting: Oncology

## 2024-04-29 DIAGNOSIS — M85859 Other specified disorders of bone density and structure, unspecified thigh: Secondary | ICD-10-CM | POA: Diagnosis not present

## 2024-04-29 DIAGNOSIS — R918 Other nonspecific abnormal finding of lung field: Secondary | ICD-10-CM | POA: Insufficient documentation

## 2024-04-29 DIAGNOSIS — Z801 Family history of malignant neoplasm of trachea, bronchus and lung: Secondary | ICD-10-CM | POA: Insufficient documentation

## 2024-04-29 DIAGNOSIS — R059 Cough, unspecified: Secondary | ICD-10-CM | POA: Diagnosis not present

## 2024-04-29 DIAGNOSIS — L299 Pruritus, unspecified: Secondary | ICD-10-CM | POA: Insufficient documentation

## 2024-04-29 DIAGNOSIS — D509 Iron deficiency anemia, unspecified: Secondary | ICD-10-CM | POA: Insufficient documentation

## 2024-04-29 DIAGNOSIS — C3491 Malignant neoplasm of unspecified part of right bronchus or lung: Secondary | ICD-10-CM

## 2024-04-29 DIAGNOSIS — D500 Iron deficiency anemia secondary to blood loss (chronic): Secondary | ICD-10-CM

## 2024-04-29 DIAGNOSIS — Z923 Personal history of irradiation: Secondary | ICD-10-CM | POA: Insufficient documentation

## 2024-04-29 DIAGNOSIS — Z79899 Other long term (current) drug therapy: Secondary | ICD-10-CM | POA: Diagnosis not present

## 2024-04-29 DIAGNOSIS — Z8041 Family history of malignant neoplasm of ovary: Secondary | ICD-10-CM | POA: Diagnosis not present

## 2024-04-29 DIAGNOSIS — R200 Anesthesia of skin: Secondary | ICD-10-CM | POA: Diagnosis not present

## 2024-04-29 DIAGNOSIS — M8580 Other specified disorders of bone density and structure, unspecified site: Secondary | ICD-10-CM | POA: Diagnosis not present

## 2024-04-29 DIAGNOSIS — R197 Diarrhea, unspecified: Secondary | ICD-10-CM | POA: Insufficient documentation

## 2024-04-29 DIAGNOSIS — R10816 Epigastric abdominal tenderness: Secondary | ICD-10-CM | POA: Diagnosis not present

## 2024-04-29 DIAGNOSIS — C3411 Malignant neoplasm of upper lobe, right bronchus or lung: Secondary | ICD-10-CM | POA: Insufficient documentation

## 2024-04-29 DIAGNOSIS — J90 Pleural effusion, not elsewhere classified: Secondary | ICD-10-CM | POA: Diagnosis not present

## 2024-04-29 DIAGNOSIS — Z853 Personal history of malignant neoplasm of breast: Secondary | ICD-10-CM | POA: Insufficient documentation

## 2024-04-29 DIAGNOSIS — Z87891 Personal history of nicotine dependence: Secondary | ICD-10-CM | POA: Diagnosis not present

## 2024-04-29 DIAGNOSIS — R195 Other fecal abnormalities: Secondary | ICD-10-CM | POA: Diagnosis not present

## 2024-04-29 LAB — IRON AND TIBC
Iron: 45 ug/dL (ref 28–170)
Saturation Ratios: 11 % (ref 10.4–31.8)
TIBC: 405 ug/dL (ref 250–450)
UIBC: 360 ug/dL

## 2024-04-29 LAB — CBC WITH DIFFERENTIAL (CANCER CENTER ONLY)
Abs Immature Granulocytes: 0.02 K/uL (ref 0.00–0.07)
Basophils Absolute: 0 K/uL (ref 0.0–0.1)
Basophils Relative: 0 %
Eosinophils Absolute: 0.3 K/uL (ref 0.0–0.5)
Eosinophils Relative: 3 %
HCT: 37.1 % (ref 36.0–46.0)
Hemoglobin: 12.1 g/dL (ref 12.0–15.0)
Immature Granulocytes: 0 %
Lymphocytes Relative: 20 %
Lymphs Abs: 1.6 K/uL (ref 0.7–4.0)
MCH: 27.1 pg (ref 26.0–34.0)
MCHC: 32.6 g/dL (ref 30.0–36.0)
MCV: 83.2 fL (ref 80.0–100.0)
Monocytes Absolute: 0.8 K/uL (ref 0.1–1.0)
Monocytes Relative: 10 %
Neutro Abs: 5.1 K/uL (ref 1.7–7.7)
Neutrophils Relative %: 67 %
Platelet Count: 251 K/uL (ref 150–400)
RBC: 4.46 MIL/uL (ref 3.87–5.11)
RDW: 13.9 % (ref 11.5–15.5)
WBC Count: 7.7 K/uL (ref 4.0–10.5)
nRBC: 0 % (ref 0.0–0.2)

## 2024-04-29 LAB — CMP (CANCER CENTER ONLY)
ALT: 16 U/L (ref 0–44)
AST: 29 U/L (ref 15–41)
Albumin: 3.9 g/dL (ref 3.5–5.0)
Alkaline Phosphatase: 119 U/L (ref 38–126)
Anion gap: 10 (ref 5–15)
BUN: 15 mg/dL (ref 8–23)
CO2: 27 mmol/L (ref 22–32)
Calcium: 9.5 mg/dL (ref 8.9–10.3)
Chloride: 102 mmol/L (ref 98–111)
Creatinine: 0.91 mg/dL (ref 0.44–1.00)
GFR, Estimated: >60 mL/min (ref >60)
Glucose, Bld: 110 mg/dL — ABNORMAL HIGH (ref 70–99)
Potassium: 4.3 mmol/L (ref 3.5–5.1)
Sodium: 138 mmol/L (ref 135–145)
Total Bilirubin: 0.5 mg/dL (ref 0.0–1.2)
Total Protein: 6.9 g/dL (ref 6.5–8.1)

## 2024-04-29 LAB — CEA (ACCESS): CEA (CHCC): 3.08 ng/mL (ref 0.00–5.00)

## 2024-04-29 LAB — FERRITIN: Ferritin: 39 ng/mL (ref 11–307)

## 2024-04-29 MED ORDER — IOHEXOL 300 MG/ML  SOLN
100.0000 mL | Freq: Once | INTRAMUSCULAR | Status: AC | PRN
  Administered 2024-04-29: 75 mL via INTRAVENOUS

## 2024-04-30 ENCOUNTER — Other Ambulatory Visit: Payer: Self-pay | Admitting: Oncology

## 2024-04-30 ENCOUNTER — Inpatient Hospital Stay: Admitting: Oncology

## 2024-04-30 VITALS — BP 139/69 | HR 56 | Temp 98.2°F | Resp 16 | Ht 67.5 in | Wt 162.9 lb

## 2024-04-30 DIAGNOSIS — C3491 Malignant neoplasm of unspecified part of right bronchus or lung: Secondary | ICD-10-CM | POA: Diagnosis not present

## 2024-04-30 DIAGNOSIS — Z780 Asymptomatic menopausal state: Secondary | ICD-10-CM | POA: Diagnosis not present

## 2024-04-30 DIAGNOSIS — S2241XA Multiple fractures of ribs, right side, initial encounter for closed fracture: Secondary | ICD-10-CM | POA: Insufficient documentation

## 2024-04-30 DIAGNOSIS — M8580 Other specified disorders of bone density and structure, unspecified site: Secondary | ICD-10-CM | POA: Diagnosis not present

## 2024-04-30 DIAGNOSIS — C3411 Malignant neoplasm of upper lobe, right bronchus or lung: Secondary | ICD-10-CM | POA: Diagnosis not present

## 2024-04-30 DIAGNOSIS — J189 Pneumonia, unspecified organism: Secondary | ICD-10-CM

## 2024-04-30 MED ORDER — AZITHROMYCIN 250 MG PO TABS: ORAL_TABLET | ORAL | 0 refills | Status: AC

## 2024-04-30 NOTE — Progress Notes (Signed)
 [facility]  [address] [place],  [place]  [address] [phone]  Clinic Day: 04/30/2024  Referring physician: Vicci Odor, PA  CHIEF COMPLAINT:  CC: Stage IA2 squamous cell carcinoma of the lung  Current Treatment: Observation after stereotactic radiation  HISTORY OF PRESENT ILLNESS:  Dana [name] is a 63 y.o. female with a history of stage I hormone receptor positive right breast cancer diagnosed in August 1999.  This was a 1.1 cm invasive ductal carcinoma, treated with lumpectomy, CMF chemotherapy, and radiation.  She did not tolerate tamoxifen, but has never had evidence of recurrence.  We began seeing her in August 2004, when she moved to the area.  She had been on yearly follow-up, but was lost to follow-up from April 2014 to April 2016.[name]  CT chest, abdomen and pelvis in April 2016 did not reveal any evidence of malignancy, but there was a 6 mm nodule in the posterior right upper lobe on the CT chest.  CT chest and July 2016 revealed resolution of the 6 mm groundglass nodule in the right upper lobe. Due to her personal history of breast cancer diagnosed at age 49, we have recommended genetic testing with the [facility] Hereditary Cancer Panel testing in July 2016.  This did not reveal any clinically significant mutations or variants of uncertain significance.  She underwent total abdominal hysterectomy for dysfunctional uterine bleeding, but her ovaries are intact.  She also has atrial fibrillation, history of pneumonia, mild to moderate tricuspid regurgitation, and history of prior stroke in 2014.  She had a squamous cell carcinoma removed from her right shoulder, and a squamous cell carcinoma in situ removed from the left anterior chest. She has a history of peptic ulcer disease.  She has had some anxiety and depression.  She undergoes basically annual CT chest for lung cancer screening due to her 35-pack-year history of smoking.  She quit smoking about 10 years  ago.   She developed a newly diagnosed lung cancer in January 2024. The lung cancer screening CT scan on January 8 revealed a new large irregular subpleural solid pulmonary nodule of the right middle lobe measuring 15.9 mm in mean diameter, for a lung RADS 4B, suspicious.  PET scan revealed a hypermetabolic right upper lobe pulmonary nodule most consistent with bronchogenic carcinoma. There was no evidence of metastatic adenopathy or distant metastatic disease.  She underwent bronchoscopy and biopsies with Dr. Brenna.  Cytology revealed non-small cell lung cancer with negative lymph node.   MRI of the brain did not reveal any evidence of intracranial metastasis.  Pulmonary function tests revealed a moderate degree of COPD with an FEV1 of 2.09 L, 75% of predicted.  She therefore had a clinical stage I non-small cell lung cancer.  We discussed the options of surgical resection versus stereotactic radiation and she chose the latter. She was treated by Dr. Dewey at [facility] and tolerated this well.  She completed stereotactic radiation in April of 2024.  CT chest in July revealed interval development of peripheral groundglass and airspace consolidation within the anterior basal right upper lobe and right middle lobe with a round ed dislike area of architectural distortion along the major fissure measuring 4.4 x 2.9 cm favored to represent postradiation changes.The underlying subpleural nodule was obscured but decreased in size from 1.4 x 1.3 cm to 1.1 x 0.9 cm.    Oncology History  Malignant neoplasm of central portion of breast in female, estrogen receptor negative (HCC)  03/25/2016 Initial  Diagnosis   Malignant neoplasm of central portion of breast in female, estrogen receptor negative (HCC)   Bronchogenic lung cancer, right (HCC)  07/26/2022 Initial Diagnosis   Bronchogenic lung cancer, right (HCC)   08/09/2022 Cancer Staging   Staging form: Lung, AJCC 8th Edition - Clinical stage from 08/09/2022: Stage  IA2 (cT1b, cN0, cM0) - Signed by Cornelius Dana DEL, MD on 08/14/2022 Histopathologic type: Squamous cell carcinoma, NOS Stage prefix: Initial diagnosis Laterality: Right Tumor size (mm): 15.9 Lymph-vascular invasion (LVI): LVI not present (absent)/not identified Diagnostic confirmation: Positive histology Specimen type: Bronchial Biopsy Staged by: Managing physician Type of lung cancer: Resectable non-small cell lung cancer in inoperable patients treated with radiotherapy ECOG performance status: Grade 0 Symptoms: Absent Stage used in treatment planning: Yes National guidelines used in treatment planning: Yes Type of national guideline used in treatment planning: NCCN     INTERVAL HISTORY:  Dana [name] is here today for repeat clinical assessment for her stage IA2 squamous cell carcinoma of the lung found on lung cancer screening, and remote history of breast cancer. She had a clear mammogram on 09/24/23 and U/S just revealed dense fibroglandular tissue at the area of concern.  Patient states that she feels fair, but complains of a nonproductive cough, shortness of breath, and diarrhea. The pain in her right side is improving. She is currently grieving the loss of her mother who passed a couple of weeks ago. She reports having a fall without head strike a week later at her mother's funeral. She had a chest CT on 04/29/2024 which revealed evolving postradiation changes in the right middle lobe, interval new minimally displaced right seventh rib fracture, immediately overlying the area of radiation change, increased associated overlying soft tissue thickening, healing displaced right anterolateral fourth through sixth rib fractures, slightly increased trace right pleural effusion, new ground-glass opacity in the posterior left upper lobe, where there was tree-in-bud nodularity previously, likely infectious or inflammatory, decreased size of right hilar lymph nodes, unchanged 10 mm subcarinal lymph  node, calcified splenic artery aneurysm measuring 1.0 x 0.9 cm which is unchanged, emphysema, and aortic atherosclerosis. I explained the results of this scan with her and her husband. Her last bone density scan was in 2020 and revealed osteopenia of the hip. I will order a bone density scan for her and prescribe 250 mg azithromycin  for possible early pneumonia. She will take 2 today then 1 daily. I instructed her to take 1 calcium  supplement with vitamin D daily and to call the office if her diarrhea does not improve. She has a WBC of 7.7, hemoglobin of 12.1, and platelet count of 251,000. Her CMP is completely normal. She has an iron level of 45 and her TIBC is 405. She has a ferritin of 39. Her CEA is 3.09. I advised her to stay on iron supplement for a couple more months. I will see her back in 3 months with CBC, CMP, CEA, and CT chest to follow up on these abnormalities.  She denies fever, chills, night sweats, or other signs of infection. Her appetite is poor and Her weight has decreased 5 pounds over last 2 months. She is accompanied by her husband.  REVIEW OF SYSTEMS:  Review of Systems  Constitutional:  Positive for appetite change (decreased) and fatigue. Negative for chills, diaphoresis, fever and unexpected weight change.  HENT:  Negative.  Negative for hearing loss, lump/mass, mouth sores, nosebleeds, sore throat, tinnitus, trouble swallowing and voice change.   Eyes: Negative.  Negative for eye  problems and icterus.  Respiratory:  Positive for cough (nonproductive, worse with lying down or sitting up) and shortness of breath. Negative for chest tightness, hemoptysis and wheezing.   Cardiovascular:  Positive for chest pain (right ribcage). Negative for leg swelling and palpitations.  Gastrointestinal:  Positive for diarrhea (improving). Negative for abdominal distention, abdominal pain, blood in stool, constipation, nausea, rectal pain and vomiting.       Occasional melena  Endocrine:  Negative.   Genitourinary: Negative.  Negative for bladder incontinence, difficulty urinating, dyspareunia, dysuria, frequency, hematuria, menstrual problem, nocturia, pelvic pain, vaginal bleeding and vaginal discharge.   Musculoskeletal:  Positive for arthralgias, back pain (mid back to anterior chest) and myalgias. Negative for flank pain, gait problem, neck pain and neck stiffness.       Muscle spasms; right side pain  Skin:  Positive for itching. Negative for rash and wound.  Neurological:  Positive for numbness (sternum and right breast). Negative for dizziness, extremity weakness, gait problem, headaches, light-headedness, seizures and speech difficulty.  Hematological: Negative.  Negative for adenopathy. Does not bruise/bleed easily.  Psychiatric/Behavioral:  Positive for depression and sleep disturbance. Negative for confusion, decreased concentration and suicidal ideas. The patient is nervous/anxious.        Grief    VITALS:  Blood pressure 139/69, pulse (!) 56, temperature 98.2 F (36.8 C), temperature source Oral, resp. rate 16, height 5' 7.5 (1.715 m), weight 162 lb 14.4 oz (73.9 kg), SpO2 96%.  Wt Readings from Last 3 Encounters:  04/30/24 162 lb 14.4 oz (73.9 kg)  03/03/24 167 lb 14.4 oz (76.2 kg)  12/18/23 173 lb (78.5 kg)    Body mass index is 25.14 kg/m.  Performance status (ECOG): 1 - Symptomatic but completely ambulatory  PHYSICAL EXAM:  Physical Exam Vitals and nursing note reviewed.  Constitutional:      General: She is not in acute distress.    Appearance: Normal appearance. She is not ill-appearing, toxic-appearing or diaphoretic.  HENT:     Head: Normocephalic and atraumatic.     Right Ear: Tympanic membrane, ear canal and external ear normal. There is no impacted cerumen.     Left Ear: Tympanic membrane, ear canal and external ear normal. There is no impacted cerumen.     Nose: Nose normal. No congestion or rhinorrhea.     Mouth/Throat:     Mouth: Mucous  membranes are moist.     Pharynx: Oropharynx is clear. No oropharyngeal exudate or posterior oropharyngeal erythema.  Eyes:     General: No scleral icterus.       Right eye: No discharge.        Left eye: No discharge.     Extraocular Movements: Extraocular movements intact.     Conjunctiva/sclera: Conjunctivae normal.     Pupils: Pupils are equal, round, and reactive to light.  Neck:     Vascular: No carotid bruit.  Cardiovascular:     Rate and Rhythm: Normal rate and regular rhythm.     Pulses: Normal pulses.     Heart sounds: Normal heart sounds. No murmur heard.    No friction rub. No gallop.  Pulmonary:     Effort: Pulmonary effort is normal. No respiratory distress.     Breath sounds: Normal breath sounds. No stridor or decreased air movement. No decreased breath sounds, wheezing, rhonchi or rales.  Chest:     Chest wall: No mass or tenderness.  Breasts:    Right: Normal. No inverted nipple, mass, nipple discharge or  skin change.     Left: Normal. No inverted nipple, mass, nipple discharge or skin change.     Comments: Well healed scar in the upper outer quadrant of the right breast No masses in either breasts Abdominal:     General: Bowel sounds are normal. There is no distension.     Palpations: Abdomen is soft. There is no hepatomegaly, splenomegaly or mass.     Tenderness: There is no abdominal tenderness. There is no right CVA tenderness, left CVA tenderness, guarding or rebound.     Hernia: No hernia is present.     Comments: Tenderness along the mid anterior ribcage  Musculoskeletal:        General: No swelling, tenderness, deformity or signs of injury. Normal range of motion.     Cervical back: Normal range of motion and neck supple. No rigidity, tenderness or bony tenderness.     Right lower leg: No edema.     Left lower leg: No edema.  Lymphadenopathy:     Cervical: No cervical adenopathy.     Upper Body:     Right upper body: No supraclavicular or axillary  adenopathy.     Left upper body: No supraclavicular or axillary adenopathy.     Lower Body: No right inguinal adenopathy. No left inguinal adenopathy.  Skin:    General: Skin is warm and dry.     Coloration: Skin is not jaundiced or pale.     Findings: No bruising, erythema, lesion or rash.  Neurological:     General: No focal deficit present.     Mental Status: She is alert and oriented to person, place, and time. Mental status is at baseline.     Cranial Nerves: No cranial nerve deficit.     Sensory: No sensory deficit.     Motor: No weakness.     Coordination: Coordination normal.     Gait: Gait normal.     Deep Tendon Reflexes: Reflexes normal.  Psychiatric:        Mood and Affect: Mood normal.        Behavior: Behavior normal.        Thought Content: Thought content normal.        Judgment: Judgment normal.     Comments: Grieving loss of her mother    LABS:      Latest Ref Rng & Units 04/29/2024    1:44 PM 03/03/2024    3:59 PM 12/18/2023    4:02 PM  CBC  WBC 4.0 - 10.5 K/uL 7.7  7.5  7.5   Hemoglobin 12.0 - 15.0 g/dL 87.8  88.7  89.4   Hematocrit 36.0 - 46.0 % 37.1  34.5  33.0   Platelets 150 - 400 K/uL 251  208  224       Latest Ref Rng & Units 04/29/2024    1:44 PM 03/03/2024    3:59 PM 12/18/2023    4:02 PM  CMP  Glucose 70 - 99 mg/dL 889  883  893   BUN 8 - 23 mg/dL 15  15  15    Creatinine 0.44 - 1.00 mg/dL 9.08  9.13  9.19   Sodium 135 - 145 mmol/L 138  140  140   Potassium 3.5 - 5.1 mmol/L 4.3  3.9  3.4   Chloride 98 - 111 mmol/L 102  108  107   CO2 22 - 32 mmol/L 27  24  23    Calcium  8.9 - 10.3 mg/dL 9.5  9.5  8.9  Total Protein 6.5 - 8.1 g/dL 6.9  7.0  6.6   Total Bilirubin 0.0 - 1.2 mg/dL 0.5  0.4  0.4   Alkaline Phos 38 - 126 U/L 119  106  130   AST 15 - 41 U/L 29  30  24    ALT 0 - 44 U/L 16  20  13     Lab Results  Component Value Date   CEA1 3.2 03/27/2023   CEA 3.08 04/29/2024   /  CEA  Date Value Ref Range Status  03/27/2023 3.2 0.0 - 4.7  ng/mL Final    Comment:    (NOTE)                             Nonsmokers          <3.9                             Smokers             <5.6 [employer] Electrochemiluminescence Immunoassay (ECLIA) Values obtained with different assay methods or kits cannot be used interchangeably.  Results cannot be interpreted as absolute evidence of the presence or absence of malignant disease. Performed At: [facility] [address] [place], [place] [id number] Jennette Shorter MD Ey:[id number]    CEA ([facility])  Date Value Ref Range Status  04/29/2024 3.08 0.00 - 5.00 ng/mL Final    Comment:    (NOTE) This test was performed using [employer] Coulter's paramagnetic chemiluminescent immunoassay. Values obtained from different assay methods cannot be used interchangeably. Please note that up to 8% of patients who smoke may see values 5.1-10.0 ng/ml and 1% of patients who smoke may see CEA levels >10.0 ng/ml. Performed at [facility], [address], [place], [place] [address]    No results found for: PSA1 No results found for: CAN199 No results found for: CAN125  No results found for: STEPHANY RINGS, A1GS, A2GS, BETS, BETA2SER, GAMS, MSPIKE, SPEI Lab Results  Component Value Date   TIBC 405 04/29/2024   TIBC 424 12/18/2023   TIBC 467 (H) 04/06/2020   FERRITIN 39 04/29/2024   FERRITIN 12 12/18/2023   FERRITIN 12 04/06/2020   IRONPCTSAT 11 04/29/2024   IRONPCTSAT 8 (L) 12/18/2023   IRONPCTSAT 10 (L) 04/06/2020   Lab Results  Component Value Date   LDH 163 04/06/2020    STUDIES:  EXAM: CT CHEST WITH CONTRAST IMPRESSION: 1. Evolving postradiation changes in the right middle lobe. 2. Interval new minimally displaced right seventh rib fracture, immediately overlying the area of radiation change. Increased associated overlying soft tissue thickening. 3. Healing displaced right anterolateral fourth through sixth rib  fractures. 4. Slightly increased trace right pleural effusion. 5. New ground-glass opacity in the posterior left upper lobe, where there was tree-in-bud nodularity previously, likely infectious/inflammatory. 6. Decreased size of right hilar lymph nodes. Unchanged 10 mm subcarinal lymph node. 7. Aortic Atherosclerosis (ICD10-I70.0) and Emphysema (ICD10-J43.9). Coronary artery calcifications. Assessment for potential risk factor modification, dietary therapy or pharmacologic therapy may be warranted, if clinically indicated. EXAM: 11/06/2023 CT CHEST WITH CONTRAST IMPRESSION: 1. No substantial change in post treatment scarring in the peripheral right middle lobe involving the chest wall and the anterior fifth and sixth ribs. 2. Interval resolution of the pulmonary nodule seen previously in the posterior right apex. 3. Similar upper normal mediastinal and right hilar lymph nodes. Stable trace right  pleural effusion. 4. Small to moderate hiatal hernia. 5. Aortic Atherosclerosis (ICD10-I70.0) and Emphysema (ICD10-J43.9).   HISTORY:   Past Medical History:  Diagnosis Date   Asthma    Atypical chest pain 03/25/2016   Breast cancer (HCC)    COPD (chronic obstructive pulmonary disease) (HCC)    Dizziness 08/18/2017   Hx of adenomatous polyp of colon 08/09/2009   Hyperthyroidism 01/04/2016   Late effects of CVA (cerebrovascular accident) 03/25/2016   Lymphedema of right upper extremity 08/09/2020   Malignant neoplasm of central portion of breast in female, estrogen receptor negative (HCC) 03/25/2016   Obstructive sleep apnea syndrome 03/25/2016   Paroxysmal atrial fibrillation (HCC) 03/25/2016   Precordial chest pain 08/16/2020    Past Surgical History:  Procedure Laterality Date   ABDOMINAL HYSTERECTOMY     APPENDECTOMY     BREAST BIOPSY     BRONCHIAL BIOPSY  07/02/2022   Procedure: BRONCHIAL BIOPSIES;  Surgeon: Brenna Adine CROME, DO;  Location: [facility];  Service: Pulmonary;;    BRONCHIAL NEEDLE ASPIRATION BIOPSY  07/02/2022   Procedure: BRONCHIAL NEEDLE ASPIRATION BIOPSIES;  Surgeon: Brenna Adine CROME, DO;  Location: [facility];  Service: Pulmonary;;   FINE NEEDLE ASPIRATION  07/02/2022   Procedure: FINE NEEDLE ASPIRATION (FNA) LINEAR;  Surgeon: Brenna Adine CROME, DO;  Location: [facility];  Service: Pulmonary;;   lymph node removal     PORTACATH PLACEMENT     portacath removal     TONSILLECTOMY     VIDEO BRONCHOSCOPY WITH ENDOBRONCHIAL ULTRASOUND  07/02/2022   Procedure: VIDEO BRONCHOSCOPY WITH ENDOBRONCHIAL ULTRASOUND;  Surgeon: Brenna Adine CROME, DO;  Location: [facility];  Service: Pulmonary;;    Family History  Problem Relation Age of Onset   Hypertension Maternal Grandmother    Lung cancer Maternal Uncle    Hypertension Mother    Ovarian cancer Paternal Aunt    Ovarian cancer Paternal Grandfather     Social History:  reports that she quit smoking about 11 years ago. Her smoking use included cigarettes. She has never used smokeless tobacco. She reports current alcohol use. She reports that she does not use drugs.The patient is accompanied by her husband today.  Allergies: No Known Allergies  Current Medications: Current Outpatient Medications  Medication Sig Dispense Refill   apixaban  (ELIQUIS ) 5 MG TABS tablet Take 1 tablet (5 mg total) by mouth 2 (two) times daily. 180 tablet 3   atenolol (TENORMIN) 50 MG tablet Take 50 mg by mouth daily.     atorvastatin  (LIPITOR) 80 MG tablet TAKE ONE TABLET BY MOUTH DAILY 90 tablet 1   azithromycin  (ZITHROMAX  Z-PAK) 250 MG tablet 2 pills today, then 1 pill daily 6 each 0   diltiazem  (CARDIZEM ) 30 MG tablet Take 1 tablet (30 mg total) by mouth every 6 (six) hours as needed (palpitations). 30 tablet 1   hydrOXYzine  (ATARAX ) 25 MG tablet Take 1 tablet (25 mg total) by mouth every 8 (eight) hours as needed for anxiety. 60 tablet 5   pantoprazole  (PROTONIX ) 40 MG tablet Take 1 tablet (40 mg total) by mouth 2 (two) times  daily. 60 tablet 5   sertraline (ZOLOFT) 100 MG tablet Take 150 mg by mouth daily.      torsemide  (DEMADEX ) 10 MG tablet TAKE ONE TABLET BY MOUTH ONCE DAILY 90 tablet 1   traMADol  (ULTRAM ) 50 MG tablet Take 1 tablet (50 mg total) by mouth every 6 (six) hours as needed for moderate pain (pain score 4-6). 120 tablet 3   TRELEGY  ELLIPTA 100-62.5-25 MCG/ACT AEPB Take 1 puff by mouth daily.     No current facility-administered medications for this visit.   ASSESSMENT & PLAN:  Assessment: 1.  Remote history of hormone receptor positive right breast cancer treated with lumpectomy and adjuvant chemotherapy radiation therapy.  She did not tolerate tamoxifen.  Genetic testing was negative.  She had a clear mammogram on 09/24/23 and US  just revealed dense fibroglandular tissue at the area of concern.  2.  Stage IA2 non-small cell lung cancer.  The postradiation changes in the right upper lobe nearly resolved.  There is a minimal increase in a paratracheal lymph node on her last CT scan of uncertain significance. CT chest done on 11/06/2023 revealed no substantial change in post treatment scarring in the peripheral right middle lobe involving the chest wall and the anterior fifth and sixth ribs, interval resolution of the pulmonary nodule seen previously in the posterior right apex, similar upper normal mediastinal and right hilar lymph nodes, and stable trace right pleural effusion. CT in December 2025 is relatively stable, but now she has a new fracture of the right 7th rib. She has evolving postradiation changes in the right middle lobe and a new ground-glass opacity in the posterior left upper lobe.  3. Symptoms of the right chest and right breast which are likely related to her recent radiation of the right middle lobe lesion. This includes pain, itching, and numbness. We did a CT of the chest which essentially remains unchanged but the pulmonary nodule in the right apex has resolved.   4.  Iron deficiency  anemia diagnosed in June. She did have black stools, epigastric tenderness and also decrease in stool caliber. The anemia evaluation did not get done and so I have reordered it for today. Stool hemoccults were negative. She will continue the pantoprazole  at [facility] and have a referral to Dr. Larene. Her hemoglobin is now up to 12.1 on the iron supplement and her iron studies look good but are just barely normal.  I think she needs to continue the iron for 2-3 more months to build up her stores. She still needs to see Dr. Larene when things settle down.   5. Grief. Her mother was placed on Hospice care and expired two weeks ago.  6. Ground-glass opacity in the left upper lobe which looks infectious to me and probably represents an early pneumonia. She has had cough and shortness of breath and I will place her on a Z-pack. We will plan to repeat her scan sooner to be sure this resolves, i.e. in 3 months  Plan: She feels fair, but complains of a nonproductive cough, shortness of breath, and diarrhea. The pain in her right side is improving. She is currently grieving the loss of her mother who passed a couple of weeks ago. She reports having a fall without head strike a week later at her mother's funeral. She had a chest CT on 04/29/2024 which revealed evolving postradiation changes in the right middle lobe, interval new minimally displaced right seventh rib fracture, immediately overlying the area of radiation change, increased associated overlying soft tissue thickening, healing displaced right anterolateral fourth through sixth rib fractures, slightly increased trace right pleural effusion, new ground-glass opacity in the posterior left upper lobe, where there was tree-in-bud nodularity previously, likely infectious/inflammatory, decreased size of right hilar lymph nodes, unchanged 10 mm subcarinal lymph node, calcified splenic artery aneurysm measuring 1.0 x 0.9 cm which is unchanged, emphysema, and  aortic atherosclerosis. I explained  the results of this scan with her and her husband. Her last bone density scan was in 2020 and revealed osteopenia of the hip. I will order a bone density scan for her and prescribe 250 mg azithromycin  for possible early pneumonia. She will take 2 today then 1 daily. I instructed her to take 1 calcium  supplement with vitamin D daily and to call the office if her diarrhea does not improve. She has a WBC of 7.7, hemoglobin of 12.1, and platelet count of 251,000. Her CMP is completely normal. She has an iron level of 45 and her TIBC is 405. She has a ferritin of 39. Her CEA is 3.09. I advised her to stay on iron supplement for a couple more months. I will see her back in 3 months with CBC, CMP, CEA, and CT chest to follow up on these abnormalities. The patient understands the plans discussed today and is in agreement with them.  She knows to contact our office if she develops concerns prior to her next appointment.    I provided 37 minutes of face-to-face time during this encounter and > 50% was spent counseling as documented under my assessment and plan.   Dana VEAR Cornish, MD [facility] - A DEPT OF [facility] [address] [place] [address] Dept: [phone] Dept Fax: [phone]   No orders of the defined types were placed in this encounter.   I have reviewed this report as typed by the medical scribe, and it is complete and accurate.

## 2024-05-06 ENCOUNTER — Inpatient Hospital Stay (INDEPENDENT_AMBULATORY_CARE_PROVIDER_SITE_OTHER): Admission: RE | Admit: 2024-05-06 | Discharge: 2024-05-06 | Attending: Oncology | Admitting: Oncology

## 2024-05-06 ENCOUNTER — Telehealth: Payer: Self-pay | Admitting: Dietician

## 2024-05-06 ENCOUNTER — Inpatient Hospital Stay: Admitting: Oncology

## 2024-05-06 DIAGNOSIS — S2241XA Multiple fractures of ribs, right side, initial encounter for closed fracture: Secondary | ICD-10-CM

## 2024-05-06 NOTE — Telephone Encounter (Signed)
 Patient screened on MST. First attempt to reach. Provided my cell# on voice mail to return call to set up a nutrition consult.  Micheline Craven, RDN, LDN Registered Dietitian, [facility] Part Time Remote (Usual office hours: Tuesday-Thursday) Cell: [phone]

## 2024-05-07 ENCOUNTER — Encounter: Payer: Self-pay | Admitting: Oncology

## 2024-05-07 DIAGNOSIS — M8580 Other specified disorders of bone density and structure, unspecified site: Secondary | ICD-10-CM | POA: Insufficient documentation

## 2024-05-10 ENCOUNTER — Other Ambulatory Visit: Payer: Self-pay | Admitting: Oncology

## 2024-05-10 DIAGNOSIS — R0789 Other chest pain: Secondary | ICD-10-CM

## 2024-06-03 ENCOUNTER — Other Ambulatory Visit: Payer: Self-pay | Admitting: Oncology

## 2024-06-03 DIAGNOSIS — K274 Chronic or unspecified peptic ulcer, site unspecified, with hemorrhage: Secondary | ICD-10-CM

## 2024-06-29 ENCOUNTER — Telehealth: Payer: Self-pay

## 2024-07-29 ENCOUNTER — Other Ambulatory Visit (HOSPITAL_BASED_OUTPATIENT_CLINIC_OR_DEPARTMENT_OTHER): Admitting: Radiology

## 2024-07-29 ENCOUNTER — Inpatient Hospital Stay

## 2024-07-30 ENCOUNTER — Inpatient Hospital Stay: Admitting: Oncology
